# Patient Record
Sex: Female | Born: 1990 | Race: Black or African American | Hispanic: No | Marital: Single | Smoking: Former smoker
Health system: Southern US, Community
[De-identification: ages and names within clinical notes are randomized; demographics above are authoritative.]

## PROBLEM LIST (undated history)

## (undated) ENCOUNTER — Inpatient Hospital Stay (HOSPITAL_COMMUNITY): Payer: Self-pay

## (undated) DIAGNOSIS — N883 Incompetence of cervix uteri: Secondary | ICD-10-CM

## (undated) DIAGNOSIS — O24419 Gestational diabetes mellitus in pregnancy, unspecified control: Secondary | ICD-10-CM

## (undated) DIAGNOSIS — Z789 Other specified health status: Secondary | ICD-10-CM

## (undated) DIAGNOSIS — E119 Type 2 diabetes mellitus without complications: Secondary | ICD-10-CM

## (undated) HISTORY — PX: INDUCED ABORTION: SHX677

---
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
## 2010-01-03 ENCOUNTER — Encounter (INDEPENDENT_AMBULATORY_CARE_PROVIDER_SITE_OTHER): Payer: Self-pay | Admitting: *Deleted

## 2010-01-03 ENCOUNTER — Ambulatory Visit: Payer: Self-pay | Admitting: Obstetrics & Gynecology

## 2010-01-10 ENCOUNTER — Ambulatory Visit: Payer: Self-pay | Admitting: Obstetrics & Gynecology

## 2010-01-10 ENCOUNTER — Encounter: Payer: Self-pay | Admitting: Obstetrics & Gynecology

## 2010-01-10 LAB — CONVERTED CEMR LAB
Platelets: 158 10*3/uL (ref 150–400)
WBC: 10.9 10*3/uL — ABNORMAL HIGH (ref 4.0–10.5)

## 2010-01-31 ENCOUNTER — Ambulatory Visit: Payer: Self-pay | Admitting: Obstetrics and Gynecology

## 2010-02-03 ENCOUNTER — Inpatient Hospital Stay (HOSPITAL_COMMUNITY)
Admission: AD | Admit: 2010-02-03 | Discharge: 2010-02-03 | Payer: Self-pay | Source: Home / Self Care | Admitting: Obstetrics & Gynecology

## 2010-02-14 ENCOUNTER — Ambulatory Visit (HOSPITAL_COMMUNITY)
Admission: RE | Admit: 2010-02-14 | Discharge: 2010-02-14 | Payer: Self-pay | Source: Home / Self Care | Admitting: Obstetrics & Gynecology

## 2010-02-14 ENCOUNTER — Ambulatory Visit: Payer: Self-pay | Admitting: Family Medicine

## 2010-02-21 ENCOUNTER — Ambulatory Visit: Payer: Self-pay | Admitting: Family Medicine

## 2010-02-28 ENCOUNTER — Ambulatory Visit: Payer: Self-pay | Admitting: Obstetrics & Gynecology

## 2010-03-07 ENCOUNTER — Encounter: Payer: Self-pay | Admitting: Family Medicine

## 2010-03-07 ENCOUNTER — Ambulatory Visit: Payer: Self-pay | Admitting: Family Medicine

## 2010-03-14 ENCOUNTER — Ambulatory Visit: Payer: Self-pay | Admitting: Obstetrics & Gynecology

## 2010-03-17 ENCOUNTER — Inpatient Hospital Stay (HOSPITAL_COMMUNITY)
Admission: AD | Admit: 2010-03-17 | Discharge: 2010-03-17 | Payer: Self-pay | Source: Home / Self Care | Attending: Obstetrics & Gynecology | Admitting: Obstetrics & Gynecology

## 2010-03-17 ENCOUNTER — Inpatient Hospital Stay (HOSPITAL_COMMUNITY)
Admission: AD | Admit: 2010-03-17 | Discharge: 2010-03-19 | Payer: Self-pay | Source: Home / Self Care | Attending: Obstetrics & Gynecology | Admitting: Obstetrics & Gynecology

## 2010-03-20 ENCOUNTER — Ambulatory Visit: Admit: 2010-03-20 | Payer: Self-pay | Admitting: Obstetrics & Gynecology

## 2010-04-07 ENCOUNTER — Encounter: Payer: Self-pay | Admitting: *Deleted

## 2010-05-27 LAB — POCT URINALYSIS DIPSTICK
Bilirubin Urine: NEGATIVE
Glucose, UA: NEGATIVE mg/dL
Glucose, UA: NEGATIVE mg/dL
Ketones, ur: NEGATIVE mg/dL
Ketones, ur: NEGATIVE mg/dL
Nitrite: NEGATIVE
Specific Gravity, Urine: 1.02 (ref 1.005–1.030)
Urobilinogen, UA: 0.2 mg/dL (ref 0.0–1.0)

## 2010-05-27 LAB — CBC
HCT: 34 % — ABNORMAL LOW (ref 36.0–46.0)
MCV: 78 fL (ref 78.0–100.0)
Platelets: 149 10*3/uL — ABNORMAL LOW (ref 150–400)
RBC: 4.36 MIL/uL (ref 3.87–5.11)
WBC: 14.5 10*3/uL — ABNORMAL HIGH (ref 4.0–10.5)

## 2010-05-28 LAB — POCT URINALYSIS DIPSTICK
Bilirubin Urine: NEGATIVE
Bilirubin Urine: NEGATIVE
Glucose, UA: NEGATIVE mg/dL
Ketones, ur: NEGATIVE mg/dL
Ketones, ur: NEGATIVE mg/dL
Nitrite: NEGATIVE
Protein, ur: NEGATIVE mg/dL
Specific Gravity, Urine: >=1.03 (ref 1.005–1.030)
pH: 6 (ref 5.0–8.0)

## 2010-05-28 LAB — URINALYSIS, ROUTINE W REFLEX MICROSCOPIC
Glucose, UA: NEGATIVE mg/dL
Ketones, ur: NEGATIVE mg/dL
Nitrite: NEGATIVE
Specific Gravity, Urine: 1.015 (ref 1.005–1.030)
pH: 6.5 (ref 5.0–8.0)

## 2010-05-29 LAB — POCT URINALYSIS DIPSTICK
Bilirubin Urine: NEGATIVE
Bilirubin Urine: NEGATIVE
Glucose, UA: NEGATIVE mg/dL
Glucose, UA: NEGATIVE mg/dL
Ketones, ur: NEGATIVE mg/dL
Ketones, ur: NEGATIVE mg/dL
Protein, ur: NEGATIVE mg/dL
Specific Gravity, Urine: 1.02 (ref 1.005–1.030)
Specific Gravity, Urine: 1.02 (ref 1.005–1.030)
Urobilinogen, UA: 0.2 mg/dL (ref 0.0–1.0)

## 2010-05-30 LAB — URINALYSIS, ROUTINE W REFLEX MICROSCOPIC
Glucose, UA: NEGATIVE mg/dL
Glucose, UA: NEGATIVE mg/dL
Hgb urine dipstick: NEGATIVE
Protein, ur: NEGATIVE mg/dL
Specific Gravity, Urine: 1.01 (ref 1.005–1.030)
Specific Gravity, Urine: 1.02 (ref 1.005–1.030)
pH: 7 (ref 5.0–8.0)

## 2010-05-30 LAB — URINE MICROSCOPIC-ADD ON

## 2010-05-30 LAB — POCT URINALYSIS DIPSTICK
Bilirubin Urine: NEGATIVE
Glucose, UA: NEGATIVE mg/dL
Hgb urine dipstick: NEGATIVE
Ketones, ur: NEGATIVE mg/dL
Ketones, ur: NEGATIVE mg/dL
Specific Gravity, Urine: 1.02 (ref 1.005–1.030)
Specific Gravity, Urine: 1.02 (ref 1.005–1.030)

## 2010-05-30 LAB — STREP B DNA PROBE

## 2010-05-30 LAB — WET PREP, GENITAL

## 2010-05-30 LAB — GC/CHLAMYDIA PROBE AMP, GENITAL
Chlamydia, DNA Probe: NEGATIVE
GC Probe Amp, Genital: NEGATIVE

## 2010-05-30 LAB — GC/CHLAMYDIA PROBE AMP, URINE
Chlamydia, Swab/Urine, PCR: NEGATIVE
GC Probe Amp, Urine: NEGATIVE

## 2010-05-31 LAB — GC/CHLAMYDIA PROBE AMP, GENITAL: Chlamydia, DNA Probe: NEGATIVE

## 2010-05-31 LAB — URINALYSIS, ROUTINE W REFLEX MICROSCOPIC
Bilirubin Urine: NEGATIVE
Ketones, ur: NEGATIVE mg/dL
Ketones, ur: 15 mg/dL — AB
Nitrite: NEGATIVE
Nitrite: NEGATIVE
Specific Gravity, Urine: 1.025 (ref 1.005–1.030)
Specific Gravity, Urine: 1.025 (ref 1.005–1.030)
Urobilinogen, UA: 0.2 mg/dL (ref 0.0–1.0)
pH: 6.5 (ref 5.0–8.0)

## 2010-05-31 LAB — COMPREHENSIVE METABOLIC PANEL
BUN: 8 mg/dL (ref 6–23)
CO2: 24 mEq/L (ref 19–32)
Chloride: 102 mEq/L (ref 96–112)
Creatinine, Ser: 0.54 mg/dL (ref 0.4–1.2)
GFR calc non Af Amer: >60 mL/min (ref >60)
Total Bilirubin: 0.6 mg/dL (ref 0.3–1.2)

## 2010-05-31 LAB — CBC
Hemoglobin: 11.7 g/dL — ABNORMAL LOW (ref 12.0–15.0)
MCH: 28 pg (ref 26.0–34.0)
MCV: 83.5 fL (ref 78.0–100.0)
RBC: 4.2 MIL/uL (ref 3.87–5.11)

## 2010-05-31 LAB — URINE MICROSCOPIC-ADD ON

## 2010-05-31 LAB — WET PREP, GENITAL
Clue Cells Wet Prep HPF POC: NONE SEEN
Yeast Wet Prep HPF POC: NONE SEEN

## 2010-05-31 LAB — DIFFERENTIAL
Basophils Absolute: 0 10*3/uL (ref 0.0–0.1)
Eosinophils Relative: 0 % (ref 0–5)
Lymphocytes Relative: 18 % (ref 12–46)
Neutro Abs: 5.9 10*3/uL (ref 1.7–7.7)

## 2010-06-01 LAB — WET PREP, GENITAL: Trich, Wet Prep: NONE SEEN

## 2010-06-01 LAB — URINALYSIS, ROUTINE W REFLEX MICROSCOPIC
Ketones, ur: NEGATIVE mg/dL
Nitrite: NEGATIVE
Specific Gravity, Urine: 1.015 (ref 1.005–1.030)
Urobilinogen, UA: 0.2 mg/dL (ref 0.0–1.0)
pH: 7.5 (ref 5.0–8.0)

## 2010-06-01 LAB — GC/CHLAMYDIA PROBE AMP, GENITAL
Chlamydia, DNA Probe: POSITIVE — AB
GC Probe Amp, Genital: NEGATIVE

## 2010-06-01 LAB — URINE MICROSCOPIC-ADD ON

## 2010-10-09 ENCOUNTER — Emergency Department (HOSPITAL_COMMUNITY)
Admission: EM | Admit: 2010-10-09 | Discharge: 2010-10-09 | Disposition: A | Discharge status: Home / Self Care | Payer: Self-pay | Attending: Emergency Medicine | Admitting: Emergency Medicine

## 2010-10-09 DIAGNOSIS — H5789 Other specified disorders of eye and adnexa: Secondary | ICD-10-CM | POA: Insufficient documentation

## 2010-10-09 DIAGNOSIS — H109 Unspecified conjunctivitis: Secondary | ICD-10-CM | POA: Insufficient documentation

## 2010-12-06 ENCOUNTER — Emergency Department (HOSPITAL_COMMUNITY)
Admission: EM | Admit: 2010-12-06 | Discharge: 2010-12-07 | Disposition: A | Discharge status: Home / Self Care | Payer: Self-pay | Attending: Emergency Medicine | Admitting: Emergency Medicine

## 2010-12-06 DIAGNOSIS — N390 Urinary tract infection, site not specified: Secondary | ICD-10-CM | POA: Insufficient documentation

## 2010-12-06 DIAGNOSIS — R300 Dysuria: Secondary | ICD-10-CM | POA: Insufficient documentation

## 2010-12-06 LAB — URINALYSIS, ROUTINE W REFLEX MICROSCOPIC
Bilirubin Urine: NEGATIVE
Ketones, ur: NEGATIVE mg/dL
Nitrite: NEGATIVE
Protein, ur: 30 mg/dL — AB
Specific Gravity, Urine: 1.026 (ref 1.005–1.030)
Urobilinogen, UA: 0.2 mg/dL (ref 0.0–1.0)

## 2010-12-06 LAB — URINE MICROSCOPIC-ADD ON

## 2010-12-07 LAB — PREGNANCY, URINE: Preg Test, Ur: NEGATIVE

## 2010-12-09 LAB — URINE CULTURE
Colony Count: >=100000
Culture  Setup Time: 201209220541

## 2012-09-26 ENCOUNTER — Encounter (HOSPITAL_COMMUNITY): Payer: Self-pay | Admitting: Family Medicine

## 2012-09-26 ENCOUNTER — Emergency Department (HOSPITAL_COMMUNITY)
Admission: EM | Admit: 2012-09-26 | Discharge: 2012-09-27 | Disposition: A | Discharge status: Home / Self Care | Payer: Self-pay | Attending: Emergency Medicine | Admitting: Emergency Medicine

## 2012-09-26 DIAGNOSIS — N898 Other specified noninflammatory disorders of vagina: Secondary | ICD-10-CM | POA: Insufficient documentation

## 2012-09-26 DIAGNOSIS — F17200 Nicotine dependence, unspecified, uncomplicated: Secondary | ICD-10-CM | POA: Insufficient documentation

## 2012-09-26 LAB — URINALYSIS, ROUTINE W REFLEX MICROSCOPIC
Bilirubin Urine: NEGATIVE
Glucose, UA: NEGATIVE mg/dL
Ketones, ur: NEGATIVE mg/dL
Specific Gravity, Urine: 1.036 — ABNORMAL HIGH (ref 1.005–1.030)
pH: 6 (ref 5.0–8.0)

## 2012-09-26 LAB — WET PREP, GENITAL
Clue Cells Wet Prep HPF POC: NONE SEEN
Trich, Wet Prep: NONE SEEN

## 2012-09-26 NOTE — ED Notes (Signed)
Patient states she has had a white vaginal discharge for 3 days. Denies odor. Reports that she had vaginal itching today. Denies dysuria.

## 2012-09-26 NOTE — ED Provider Notes (Signed)
   History    CSN: [id number] Arrival date & time 09/26/12  2019  First MD Initiated Contact with Patient 09/26/12 2257     Chief Complaint  Patient presents with  . Vaginal Discharge   (Consider location/radiation/quality/duration/timing/severity/associated sxs/prior Treatment) HPI Comments: Vaginal discharge for the pat 3 days   Just returned from a cruise, 1 sexual partner for the past 4 years, no concern for STD  Patient is a 22 y.o. female presenting with vaginal discharge. The history is provided by the patient.  Vaginal Discharge Quality:  Thick and milky Severity:  Moderate Onset quality:  Gradual Duration:  3 days Timing:  Constant Progression:  Worsening Chronicity:  New Context: spontaneously   Context: not recent antibiotic use   Relieved by:  Nothing Ineffective treatments:  None tried Associated symptoms: vaginal itching   Associated symptoms: no dysuria, no fever, no genital lesions, no nausea and no urinary frequency   Risk factors: no gynecological surgery, no STI and no STI exposure    History reviewed. No pertinent past medical history. History reviewed. No pertinent past surgical history. No family history on file. History  Substance Use Topics  . Smoking status: Current Every Day Smoker    Types: Cigars  . Smokeless tobacco: Not on file  . Alcohol Use: Yes     Comment: Occasionally   OB History   Grav Para Term Preterm Abortions TAB SAB Ect Mult Living                 Review of Systems  Constitutional: Negative for fever.  Gastrointestinal: Negative for nausea.  Genitourinary: Positive for vaginal discharge. Negative for dysuria and vaginal pain.  Skin: Negative for rash.  Neurological: Negative for dizziness.  All other systems reviewed and are negative.    Allergies  Review of patient's allergies indicates no known allergies.  Home Medications  No current outpatient prescriptions on file. BP 107/57  Pulse 69  Temp(Src) 99.1 F  (37.3 C) (Oral)  Resp 15  Ht 5\' 7"  (1.702 m)  Wt 140 lb (63.504 kg)  BMI 21.92 kg/m2  SpO2 100%  LMP 09/06/2012 Physical Exam  Nursing note and vitals reviewed. Constitutional: She appears well-nourished.  HENT:  Head: Normocephalic.  Eyes: Pupils are equal, round, and reactive to light.  Neck: Normal range of motion.  Cardiovascular: Normal rate.   Pulmonary/Chest: Effort normal and breath sounds normal.  Abdominal: Soft. Bowel sounds are normal. She exhibits no distension.  Genitourinary: Uterus normal. Vaginal discharge found.  Musculoskeletal: Normal range of motion.  Neurological: She is alert.  Skin: Skin is warm and dry. No erythema.    ED Course  Procedures (including critical care time) Labs Reviewed  URINALYSIS, ROUTINE W REFLEX MICROSCOPIC - Abnormal; Notable for the following:    Specific Gravity, Urine 1.036 (*)    All other components within normal limits  GC/CHLAMYDIA PROBE AMP  WET PREP, GENITAL  POCT PREGNANCY, URINE   No results found. No diagnosis found.  MDM   Will treat with metrogel and desown   Arman Filter, NP 09/27/12 4098

## 2012-09-27 LAB — GC/CHLAMYDIA PROBE AMP
CT Probe RNA: NEGATIVE
GC Probe RNA: NEGATIVE

## 2012-09-27 MED ORDER — DESONIDE 0.05 % EX OINT: TOPICAL_OINTMENT | Freq: Two times a day (BID) | CUTANEOUS | Status: DC

## 2012-09-27 MED ORDER — METRONIDAZOLE 0.75 % VA GEL: 1.0000 | Freq: Two times a day (BID) | VAGINAL | Status: DC

## 2012-09-27 NOTE — ED Provider Notes (Signed)
Medical screening examination/treatment/procedure(s) were performed by non-physician practitioner and as supervising physician I was immediately available for consultation/collaboration.  John-Adam Lupe Bonner, M.D.  John-Adam Dynasia Kercheval, MD 09/27/12 0402 

## 2014-06-14 ENCOUNTER — Inpatient Hospital Stay (HOSPITAL_COMMUNITY)
Admission: AD | Admit: 2014-06-14 | Discharge: 2014-06-14 | Disposition: A | Discharge status: Home / Self Care | Payer: Self-pay | Source: Ambulatory Visit | Attending: Obstetrics & Gynecology | Admitting: Obstetrics & Gynecology

## 2014-06-14 ENCOUNTER — Encounter (HOSPITAL_COMMUNITY): Payer: Self-pay | Admitting: *Deleted

## 2014-06-14 DIAGNOSIS — Z87891 Personal history of nicotine dependence: Secondary | ICD-10-CM | POA: Insufficient documentation

## 2014-06-14 DIAGNOSIS — N939 Abnormal uterine and vaginal bleeding, unspecified: Secondary | ICD-10-CM

## 2014-06-14 HISTORY — DX: Other specified health status: Z78.9

## 2014-06-14 LAB — CBC WITH DIFFERENTIAL/PLATELET
BASOS ABS: 0 10*3/uL (ref 0.0–0.1)
Basophils Relative: 0 % (ref 0–1)
EOS ABS: 0.2 10*3/uL (ref 0.0–0.7)
EOS PCT: 2 % (ref 0–5)
HEMATOCRIT: 35 % — AB (ref 36.0–46.0)
HEMOGLOBIN: 11.6 g/dL — AB (ref 12.0–15.0)
LYMPHS ABS: 2.2 10*3/uL (ref 0.7–4.0)
Lymphocytes Relative: 24 % (ref 12–46)
MCH: 26.9 pg (ref 26.0–34.0)
MCHC: 33.1 g/dL (ref 30.0–36.0)
MCV: 81 fL (ref 78.0–100.0)
Monocytes Absolute: 1 10*3/uL (ref 0.1–1.0)
Monocytes Relative: 11 % (ref 3–12)
NEUTROS ABS: 6.1 10*3/uL (ref 1.7–7.7)
Neutrophils Relative %: 64 % (ref 43–77)
Platelets: 222 10*3/uL (ref 150–400)
RBC: 4.32 MIL/uL (ref 3.87–5.11)
RDW: 13.9 % (ref 11.5–15.5)
WBC: 9.5 10*3/uL (ref 4.0–10.5)

## 2014-06-14 LAB — WET PREP, GENITAL
Clue Cells Wet Prep HPF POC: NONE SEEN
TRICH WET PREP: NONE SEEN
Yeast Wet Prep HPF POC: NONE SEEN

## 2014-06-14 LAB — POCT PREGNANCY, URINE: PREG TEST UR: NEGATIVE

## 2014-06-14 NOTE — MAU Note (Signed)
Done came on the 3rd time this month. Has been having heavy bleeding, started last night..  Has done 2 preg tests, both were neg.  Called to the dr, was told to come up here.

## 2014-06-14 NOTE — MAU Provider Note (Signed)
History     CSN: [id number]  Arrival date and time: 06/14/14 1106   First Provider Initiated Contact with Patient 06/14/14 1307      Chief Complaint  Patient presents with  . Vaginal Bleeding   HPI Kelsey Jones y.o. [id number] nonpregnant female presents complaining of having a period 3 times during the month of March.  She is not on contraception and does not desire pregnancy.  She notes a yeast infection 2 months ago.  She has noted some white vaginal discharge, back pain 2 weeks ago, denies abdominal pain, dysuria, fever, weakness, nausea, vomiting.  Last week she had nausea and vomiting one time.  She used Plan B about 2 weeks ago.  The following day, she noticed her period starting.   OB History    Gravida Para Term Preterm AB TAB SAB Ectopic Multiple Living   Past Medical History  Diagnosis Date  . Medical history non-contributory     Past Surgical History  Procedure Laterality Date  . Induced abortion      Family History  Problem Relation Age of Onset  . Diabetes Mother   . Cancer Maternal Grandmother   . Diabetes Maternal Grandmother     History  Substance Use Topics  . Smoking status: Former Smoker    Types: Cigars  . Smokeless tobacco: Not on file  . Alcohol Use: Yes     Comment: Occasionally    Allergies: No Known Allergies  Prescriptions prior to admission  Medication Sig Dispense Refill Last Dose  . ibuprofen (ADVIL,MOTRIN) 200 MG tablet Take 800 mg by mouth every 6 (six) hours as needed for moderate pain.   Past Month at Unknown time  . desonide (DESOWEN) 0.05 % ointment Apply topically 2 (two) times daily. (Patient not taking: Reported on 06/14/2014) 15 g 0   . metroNIDAZOLE (METROGEL VAGINAL) 0.75 % vaginal gel Place 1 Applicatorful vaginally 2 (two) times daily. (Patient not taking: Reported on 06/14/2014) 70 g 0     ROS Pertinent ROS in HPI   Physical Exam   Blood pressure 123/77, pulse 96, temperature 98.9 F  (37.2 C), temperature source Oral, resp. rate 18, height  (1.676 m), weight 135 lb (61.236 kg), last menstrual period 06/13/2014.  Physical Exam  Constitutional: She is oriented to person, place, and time. She appears well-developed and well-nourished. No distress.  HENT:  Head: Normocephalic and atraumatic.  Eyes: EOM are normal.  Neck: Normal range of motion.  Cardiovascular: Normal rate, regular rhythm and normal heart sounds.   Respiratory: Effort normal and breath sounds normal. No respiratory distress.  GI: Soft. Bowel sounds are normal. She exhibits no distension. There is no tenderness. There is no rebound and no guarding.  Genitourinary:  Small amt of bleeding coming from cervical os.  No discharge appreciated No CMT.  No adnexal mass or tenderness appreciated.  Musculoskeletal: Normal range of motion.  Neurological: She is alert and oriented to person, place, and time.  Skin: Skin is warm and dry.  Psychiatric: She has a normal mood and affect.   Results for orders placed or performed during the hospital encounter of 06/14/14 (from the past 24 hour(s))  CBC with Differential/Platelet     Status: Abnormal   Collection Time: 06/14/14 11:55 AM  Result Value Ref Range   WBC 9.5 4.0 - 10.5 K/uL   RBC 4.32 3.87 - 5.11 MIL/uL  Hemoglobin 11.6 (L) 12.0 - 15.0 g/dL   HCT 16.135.0 (L) 09.636.0 - 04.546.0 %   MCV 81.0 78.0 - 100.0 fL   MCH 26.9 26.0 - 34.0 pg   MCHC 33.1 30.0 - 36.0 g/dL   RDW 40.913.9 81.111.5 - 91.415.5 %   Platelets 222 150 - 400 K/uL   Neutrophils Relative % 64 43 - 77 %   Neutro Abs 6.1 1.7 - 7.7 K/uL   Lymphocytes Relative 24 12 - 46 %   Lymphs Abs 2.2 0.7 - 4.0 K/uL   Monocytes Relative 11 3 - 12 %   Monocytes Absolute 1.0 0.1 - 1.0 K/uL   Eosinophils Relative 2 0 - 5 %   Eosinophils Absolute 0.2 0.0 - 0.7 K/uL   Basophils Relative 0 0 - 1 %   Basophils Absolute 0.0 0.0 - 0.1 K/uL  Pregnancy, urine POC     Status: None   Collection Time: 06/14/14 12:01 PM  Result  Value Ref Range   Preg Test, Ur NEGATIVE NEGATIVE  Wet prep, genital     Status: Abnormal   Collection Time: 06/14/14  1:17 PM  Result Value Ref Range   Yeast Wet Prep HPF POC NONE SEEN NONE SEEN   Trich, Wet Prep NONE SEEN NONE SEEN   Clue Cells Wet Prep HPF POC NONE SEEN NONE SEEN   WBC, Wet Prep HPF POC FEW (A) NONE SEEN    MAU Course  Procedures  MDM Negative PRT.  CBC stable.   STD screening done Assessment and Plan  A: Abnormal vaginal bleeding following Plan B usage  P: Discharge to home Abnormal bleeding likely caused from Plan B usage.   Follow up at health department to begin contraception Condoms for all IC for STD prevention and contraception at this time.   Labs pending.   Patient may return to MAU as needed or if her condition were to change or worsen   Bertram Denvereague Clark, Karen E 06/14/2014, 1:09 PM

## 2014-06-14 NOTE — MAU Note (Signed)
C/o having  3 periods this month;

## 2014-06-14 NOTE — Discharge Instructions (Signed)
Abnormal Uterine Bleeding Abnormal uterine bleeding can affect women at various stages in life, including teenagers, women in their reproductive years, pregnant women, and women who have reached menopause. Several kinds of uterine bleeding are considered abnormal, including:  Bleeding or spotting between periods.   Bleeding after sexual intercourse.   Bleeding that is heavier or more than normal.   Periods that last longer than usual.  Bleeding after menopause.  Many cases of abnormal uterine bleeding are minor and simple to treat, while others are more serious. Any type of abnormal bleeding should be evaluated by your health care provider. Treatment will depend on the cause of the bleeding. HOME CARE INSTRUCTIONS Monitor your condition for any changes. The following actions may help to alleviate any discomfort you are experiencing:  Avoid the use of tampons and douches as directed by your health care provider.  Change your pads frequently. You should get regular pelvic exams and Pap tests. Keep all follow-up appointments for diagnostic tests as directed by your health care provider.  SEEK MEDICAL CARE IF:   Your bleeding lasts more than 1 week.   You feel dizzy at times.  SEEK IMMEDIATE MEDICAL CARE IF:   You pass out.   You are changing pads every 15 to 30 minutes.   You have abdominal pain.  You have a fever.   You become sweaty or weak.   You are passing large blood clots from the vagina.   You start to feel nauseous and vomit. MAKE SURE YOU:   Understand these instructions.  Will watch your condition.  Will get help right away if you are not doing well or get worse. Document Released: 03/03/2005 Document Revised: 03/08/2013 Document Reviewed: 09/30/2012 Exit[employer] Patient Information 2015 [employer]. This information is not intended to replace advice given to you by your health care provider. Make sure you discuss any questions you have with your  health care provider.  

## 2014-06-15 LAB — GC/CHLAMYDIA PROBE AMP (~~LOC~~) NOT AT ARMC
Chlamydia: NEGATIVE
NEISSERIA GONORRHEA: NEGATIVE

## 2015-04-11 ENCOUNTER — Emergency Department (HOSPITAL_COMMUNITY)
Admission: EM | Admit: 2015-04-11 | Discharge: 2015-04-11 | Disposition: A | Discharge status: Home / Self Care | Payer: 59 | Attending: Emergency Medicine | Admitting: Emergency Medicine

## 2015-04-11 ENCOUNTER — Encounter (HOSPITAL_COMMUNITY): Payer: Self-pay | Admitting: Emergency Medicine

## 2015-04-11 DIAGNOSIS — Y998 Other external cause status: Secondary | ICD-10-CM | POA: Diagnosis not present

## 2015-04-11 DIAGNOSIS — Y9389 Activity, other specified: Secondary | ICD-10-CM | POA: Insufficient documentation

## 2015-04-11 DIAGNOSIS — S60861A Insect bite (nonvenomous) of right wrist, initial encounter: Secondary | ICD-10-CM | POA: Diagnosis not present

## 2015-04-11 DIAGNOSIS — Z87891 Personal history of nicotine dependence: Secondary | ICD-10-CM | POA: Diagnosis not present

## 2015-04-11 DIAGNOSIS — R2231 Localized swelling, mass and lump, right upper limb: Secondary | ICD-10-CM | POA: Diagnosis present

## 2015-04-11 DIAGNOSIS — Y9289 Other specified places as the place of occurrence of the external cause: Secondary | ICD-10-CM | POA: Insufficient documentation

## 2015-04-11 DIAGNOSIS — W57XXXA Bitten or stung by nonvenomous insect and other nonvenomous arthropods, initial encounter: Secondary | ICD-10-CM | POA: Insufficient documentation

## 2015-04-11 MED ORDER — CEPHALEXIN 500 MG PO CAPS: 500.0000 mg | ORAL_CAPSULE | Freq: Four times a day (QID) | ORAL | Status: DC

## 2015-04-11 NOTE — ED Notes (Signed)
Pt st's she first noticed a bug bite on right inner wrist 4 days ago.  Pt denies any itching but st's area is painful

## 2015-04-11 NOTE — ED Provider Notes (Signed)
CSN: [id number]     Arrival date & time 04/11/15  1457 History  By signing my name below, I, [facility], attest that this documentation has been prepared under the direction and in the presence of Arthor Captain, PA-C. Electronically Signed: Randell Patient, ED Scribe. 04/11/2015. 4:36 PM.   No chief complaint on file.  The history is provided by the patient. No language interpreter was used.   HPI Comments: Kelsey Jones is a 25 y.o. female with no hx of chronic conditions who presents to the Emergency Department complaining of gradually increasing swelling and redness, and gradually worsening, constant, pain today to the dorsal surface of the right wrist that first appeared 5 days ago while at work. Patient reports that she works as a Geophysicist/field seismologist at [employer] and noticed a small bump on her wrist that gradually increased in size and became erythematous 4 days ago, followed by mild pain today. She has applied alcohol to the area without relief. She denies seeing an insect or feeling a bite on her arm. Patient denies an hx MRSA and DM. She denies illicit IV drug use. Denies fever and chills.  Past Medical History  Diagnosis Date  . Medical history non-contributory    Past Surgical History  Procedure Laterality Date  . Induced abortion     Family History  Problem Relation Age of Onset  . Diabetes Mother   . Cancer Maternal Grandmother   . Diabetes Maternal Grandmother    Social History  Substance Use Topics  . Smoking status: Former Smoker    Types: Cigars  . Smokeless tobacco: Not on file  . Alcohol Use: Yes     Comment: Occasionally   OB History    Gravida Para Term Preterm AB TAB SAB Ectopic Multiple Living   Review of Systems  Constitutional: Negative for fever.  Skin: Positive for wound.      Allergies  Review of patient's allergies indicates no known allergies.  Home Medications   Prior to Admission medications   Medication Sig  Start Date End Date Taking? Authorizing Provider  cephALEXin (KEFLEX) 500 MG capsule Take 1 capsule (500 mg total) by mouth 4 (four) times daily. 04/11/15   Arthor Captain, PA-C  ibuprofen (ADVIL,MOTRIN) 200 MG tablet Take 800 mg by mouth every 6 (six) hours as needed for moderate pain.    Historical Provider, MD   BP 111/70 mmHg  Pulse 74  Temp(Src) 98.2 F (36.8 C) (Oral)  Resp 16  Ht  (1.702 m)  Wt 150 lb 12.8 oz (68.402 kg)  BMI 23.61 kg/m2  SpO2 99% Physical Exam  Constitutional: She is oriented to person, place, and time. She appears well-developed and well-nourished. No distress.  HENT:  Head: Normocephalic and atraumatic.  Eyes: Conjunctivae and EOM are normal.  Neck: Neck supple. No tracheal deviation present.  Cardiovascular: Normal rate.   Pulmonary/Chest: Effort normal. No respiratory distress.  Musculoskeletal: Normal range of motion.  Neurological: She is alert and oriented to person, place, and time.  Skin: Skin is warm and dry.  2x3 cm circular lesion on the right dorsal surface of the wrist with induration and erythema. No fluctuance or streaking. Neruovascular intact in hand.  Psychiatric: She has a normal mood and affect. Her behavior is normal.  Nursing note and vitals reviewed.  ED Course  Procedures  DIAGNOSTIC STUDIES: Oxygen Saturation is 99% on RA, normal by my interpretation.  COORDINATION OF CARE: 4:17 PM Will prescribed antibiotics. Advised pt to return if symptoms worsen. Discussed treatment plan with pt at bedside and pt agreed to plan.  Labs Review Labs Reviewed - No data to display  Imaging Review No results found. I have personally reviewed and evaluated these images and lab results as part of my medical decision-making.   EKG Interpretation None      MDM   Final diagnoses:  Insect bite    Patient with an insect bite. Possible infection. Will treat with antibiotics. Return precautions discussed. Pt is safe for discharge at  this time  I personally performed the services described in this documentation, which was scribed in my presence. The recorded information has been reviewed and is accurate.      Arthor Captain, PA-C 04/11/15 1637  Raeford Razor, MD 04/15/15 [phone]

## 2015-04-11 NOTE — Discharge Instructions (Signed)
APply ice to the affected area. Use tylenol or ibuprofen for pain  Cellulitis Cellulitis is an infection of the skin and the tissue beneath it. The infected area is usually red and tender. Cellulitis occurs most often in the arms and lower legs.  CAUSES  Cellulitis is caused by bacteria that enter the skin through cracks or cuts in the skin. The most common types of bacteria that cause cellulitis are staphylococci and streptococci. SIGNS AND SYMPTOMS   Redness and warmth.  Swelling.  Tenderness or pain.  Fever. DIAGNOSIS  Your health care provider can usually determine what is wrong based on a physical exam. Blood tests may also be done. TREATMENT  Treatment usually involves taking an antibiotic medicine. HOME CARE INSTRUCTIONS   Take your antibiotic medicine as directed by your health care provider. Finish the antibiotic even if you start to feel better.  Keep the infected arm or leg elevated to reduce swelling.  Apply a warm cloth to the affected area up to 4 times per day to relieve pain.  Take medicines only as directed by your health care provider.  Keep all follow-up visits as directed by your health care provider. SEEK MEDICAL CARE IF:   You notice red streaks coming from the infected area.  Your red area gets larger or turns dark in color.  Your bone or joint underneath the infected area becomes painful after the skin has healed.  Your infection returns in the same area or another area.  You notice a swollen bump in the infected area.  You develop new symptoms.  You have a fever. SEEK IMMEDIATE MEDICAL CARE IF:   You feel very sleepy.  You develop vomiting or diarrhea.  You have a general ill feeling (malaise) with muscle aches and pains.   This information is not intended to replace advice given to you by your health care provider. Make sure you discuss any questions you have with your health care provider.   Document Released: 12/11/2004 Document  Revised: 11/22/2014 Document Reviewed: 05/19/2011 Elsevier Interactive Patient Education [employer]

## 2015-07-04 ENCOUNTER — Inpatient Hospital Stay (HOSPITAL_COMMUNITY)
Admission: AD | Admit: 2015-07-04 | Discharge: 2015-07-04 | Disposition: A | Discharge status: Home / Self Care | Payer: 59 | Source: Ambulatory Visit | Attending: Obstetrics & Gynecology | Admitting: Obstetrics & Gynecology

## 2015-07-04 ENCOUNTER — Encounter (HOSPITAL_COMMUNITY): Payer: Self-pay | Admitting: *Deleted

## 2015-07-04 DIAGNOSIS — A499 Bacterial infection, unspecified: Secondary | ICD-10-CM

## 2015-07-04 DIAGNOSIS — Z87891 Personal history of nicotine dependence: Secondary | ICD-10-CM | POA: Insufficient documentation

## 2015-07-04 DIAGNOSIS — N898 Other specified noninflammatory disorders of vagina: Secondary | ICD-10-CM | POA: Diagnosis present

## 2015-07-04 DIAGNOSIS — Z113 Encounter for screening for infections with a predominantly sexual mode of transmission: Secondary | ICD-10-CM | POA: Diagnosis not present

## 2015-07-04 DIAGNOSIS — B9689 Other specified bacterial agents as the cause of diseases classified elsewhere: Secondary | ICD-10-CM

## 2015-07-04 DIAGNOSIS — N760 Acute vaginitis: Secondary | ICD-10-CM | POA: Diagnosis not present

## 2015-07-04 LAB — WET PREP, GENITAL
SPERM: NONE SEEN
Trich, Wet Prep: NONE SEEN
YEAST WET PREP: NONE SEEN

## 2015-07-04 LAB — CBC
HCT: 36.2 % (ref 36.0–46.0)
HEMOGLOBIN: 12.1 g/dL (ref 12.0–15.0)
MCH: 26.5 pg (ref 26.0–34.0)
MCHC: 33.4 g/dL (ref 30.0–36.0)
MCV: 79.2 fL (ref 78.0–100.0)
Platelets: 210 10*3/uL (ref 150–400)
RBC: 4.57 MIL/uL (ref 3.87–5.11)
RDW: 14.1 % (ref 11.5–15.5)
WBC: 5.3 10*3/uL (ref 4.0–10.5)

## 2015-07-04 LAB — RAPID HIV SCREEN (HIV 1/2 AB+AG)
HIV 1/2 ANTIBODIES: NONREACTIVE
HIV-1 P24 ANTIGEN - HIV24: NONREACTIVE

## 2015-07-04 LAB — POCT PREGNANCY, URINE: Preg Test, Ur: NEGATIVE

## 2015-07-04 LAB — URINALYSIS, ROUTINE W REFLEX MICROSCOPIC
Bilirubin Urine: NEGATIVE
GLUCOSE, UA: NEGATIVE mg/dL
HGB URINE DIPSTICK: NEGATIVE
Ketones, ur: NEGATIVE mg/dL
Leukocytes, UA: NEGATIVE
Nitrite: NEGATIVE
PH: 7 (ref 5.0–8.0)
PROTEIN: NEGATIVE mg/dL
Specific Gravity, Urine: 1.02 (ref 1.005–1.030)

## 2015-07-04 MED ORDER — METRONIDAZOLE 500 MG PO TABS: 500.0000 mg | ORAL_TABLET | Freq: Two times a day (BID) | ORAL | Status: DC

## 2015-07-04 MED ORDER — FLUCONAZOLE 150 MG PO TABS: 150.0000 mg | ORAL_TABLET | Freq: Every day | ORAL | Status: DC

## 2015-07-04 NOTE — MAU Provider Note (Signed)
  History     CSN: [id number]  Arrival date and time: 07/04/15 1148   First Provider Initiated Contact with Patient 07/04/15 1211      Chief Complaint  Patient presents with  . Vaginal Discharge   HPI   Kelsey Jones is a 25 year old [id number] presenting to the MAU complaining of vaginal discharge. The discharge began 2 days ago and is described as thick, white, and malodorous that is worse after having intercourse. She reports recently having unprotected sex with her partner and would like to be STD tested. She denies any fever/chills, abd pain, vaginal bleeding/itching, or increased frequency/urgency or burning with urination. LMP 06/16/2015   OB History    Gravida Para Term Preterm AB TAB SAB Ectopic Multiple Living   2 1 1  1 1    1       Past Medical History  Diagnosis Date  . Medical history non-contributory     Past Surgical History  Procedure Laterality Date  . Induced abortion      Family History  Problem Relation Age of Onset  . Diabetes Mother   . Cancer Maternal Grandmother   . Diabetes Maternal Grandmother     Social History  Substance Use Topics  . Smoking status: Former Smoker    Types: Cigars  . Smokeless tobacco: None  . Alcohol Use: Yes     Comment: Occasionally    Allergies: No Known Allergies  Prescriptions prior to admission  Medication Sig Dispense Refill Last Dose  . cephALEXin (KEFLEX) 500 MG capsule Take 1 capsule (500 mg total) by mouth 4 (four) times daily. 20 capsule 0   . ibuprofen (ADVIL,MOTRIN) 200 MG tablet Take 800 mg by mouth every 6 (six) hours as needed for moderate pain.   Past Month at Unknown time    Review of Systems  Constitutional: Negative for fever and chills.  Gastrointestinal: Negative for nausea, vomiting, abdominal pain and diarrhea.  Genitourinary: Negative for dysuria, urgency and frequency.   Physical Exam   Blood pressure 101/65, pulse 73, temperature 98.1 F (36.7 C), temperature source Oral, resp.  rate 18, last menstrual period 06/16/2015.  Physical Exam  Constitutional: She is oriented to person, place, and time. She appears well-developed and well-nourished.  HENT:  Head: Normocephalic and atraumatic.  Eyes: Pupils are equal, round, and reactive to light.  Neck: Normal range of motion. Neck supple.  Cardiovascular: Normal rate and regular rhythm.   Respiratory: Effort normal and breath sounds normal.  GI: Soft. Bowel sounds are normal. There is no tenderness.  Genitourinary: Uterus normal. Vaginal discharge found.  Neurological: She is alert and oriented to person, place, and time.  Skin: Skin is warm and dry.   Speculum exam: normal cervix with thick white discharge; no blood noted  Pelvic exam: Uterus and adnexa were nontender without palpable masses, no CMT   MAU Course  Procedures  UA- wnl Urine pregnancy test- negative  Wet prep and STD screening  Assessment and Plan  Vaginal discharge- wet prep showed bacterial vaginosis STD testing pending  Treat as outpatient with Flagyl and prophylaxis of Diflucan (per pt request)   Eartha InchJessica Hikari Tripp PA-S 07/04/2015, 12:22 PM

## 2015-07-04 NOTE — MAU Note (Signed)
Pt C/O vaginal discharge for the last 2 days, clumpy with fishy odor.  Denies pain.

## 2015-07-04 NOTE — Discharge Instructions (Signed)

## 2015-07-04 NOTE — MAU Provider Note (Signed)
History     CSN: [id number]  Arrival date and time: 07/04/15 1148   First Provider Initiated Contact with Patient 07/04/15 1211      Chief Complaint  Patient presents with  . Vaginal Discharge   HPI Kelsey Jones is a 25 y.o. female who presents with vaginal discharge. Symptoms began 2 days ago. Reports thick white discharge with foul odor. Odor worse after intercourse. Denies vaginal bleeding, vaginal irritation, abdominal pain, dyspareunia, post coital bleeding. Denies history of STI. Currently trying to conceive and not using contraception. Requesting STI testing today.   OB History    Gravida Para Term Preterm AB TAB SAB Ectopic Multiple Living   Past Medical History  Diagnosis Date  . Medical history non-contributory     Past Surgical History  Procedure Laterality Date  . Induced abortion      Family History  Problem Relation Age of Onset  . Diabetes Mother   . Cancer Maternal Grandmother   . Diabetes Maternal Grandmother     Social History  Substance Use Topics  . Smoking status: Former Smoker    Types: Cigars  . Smokeless tobacco: None  . Alcohol Use: Yes     Comment: Occasionally    Allergies: No Known Allergies  Prescriptions prior to admission  Medication Sig Dispense Refill Last Dose  . cephALEXin (KEFLEX) 500 MG capsule Take 1 capsule (500 mg total) by mouth 4 (four) times daily. (Patient not taking: Reported on 07/04/2015) 20 capsule 0 Completed Course at Unknown time    Review of Systems  Constitutional: Negative.   Gastrointestinal: Negative.   Genitourinary: Negative for dysuria.       + vaginal discharge No vaginal bleeding   Physical Exam   Blood pressure 101/65, pulse 73, temperature 98.1 F (36.7 C), temperature source Oral, resp. rate 18, last menstrual period 06/16/2015.  Physical Exam  Nursing note and vitals reviewed. Constitutional: She is oriented to person, place, and time. She appears  well-developed and well-nourished. No distress.  HENT:  Head: Normocephalic and atraumatic.  Eyes: Conjunctivae are normal. Right eye exhibits no discharge. Left eye exhibits no discharge. No scleral icterus.  Neck: Normal range of motion.  Cardiovascular: Normal rate.   Respiratory: Effort normal. No respiratory distress.  GI: Soft. She exhibits no distension. There is no tenderness.  Genitourinary: Uterus normal. Cervix exhibits no motion tenderness. Right adnexum displays no mass. Left adnexum displays no mass. No bleeding in the vagina. Vaginal discharge (thin white discharge) found.  Neurological: She is alert and oriented to person, place, and time.  Skin: Skin is warm and dry. She is not diaphoretic.  Psychiatric: She has a normal mood and affect. Her behavior is normal. Judgment and thought content normal.    MAU Course  Procedures Results for orders placed or performed during the hospital encounter of 07/04/15 (from the past 24 hour(s))  Urinalysis, Routine w reflex microscopic (not at [facility])     Status: Abnormal   Collection Time: 07/04/15 12:00 PM  Result Value Ref Range   Color, Urine YELLOW YELLOW   APPearance HAZY (A) CLEAR   Specific Gravity, Urine 1.020 1.005 - 1.030   pH 7.0 5.0 - 8.0   Glucose, UA NEGATIVE NEGATIVE mg/dL   Hgb urine dipstick NEGATIVE NEGATIVE   Bilirubin Urine NEGATIVE NEGATIVE   Ketones, ur NEGATIVE NEGATIVE mg/dL   Protein, ur NEGATIVE NEGATIVE mg/dL   Nitrite  NEGATIVE NEGATIVE   Leukocytes, UA NEGATIVE NEGATIVE  Pregnancy, urine POC     Status: None   Collection Time: 07/04/15 12:27 PM  Result Value Ref Range   Preg Test, Ur NEGATIVE NEGATIVE  Wet prep, genital     Status: Abnormal   Collection Time: 07/04/15 12:57 PM  Result Value Ref Range   Yeast Wet Prep HPF POC NONE SEEN NONE SEEN   Trich, Wet Prep NONE SEEN NONE SEEN   Clue Cells Wet Prep HPF POC PRESENT (A) NONE SEEN   WBC, Wet Prep HPF POC MANY (A) NONE SEEN   Sperm NONE SEEN    CBC     Status: None   Collection Time: 07/04/15  1:02 PM  Result Value Ref Range   WBC 5.3 4.0 - 10.5 K/uL   RBC 4.57 3.87 - 5.11 MIL/uL   Hemoglobin 12.1 12.0 - 15.0 g/dL   HCT 16.136.2 09.636.0 - 04.546.0 %   MCV 79.2 78.0 - 100.0 fL   MCH 26.5 26.0 - 34.0 pg   MCHC 33.4 30.0 - 36.0 g/dL   RDW 40.914.1 81.111.5 - 91.415.5 %   Platelets 210 150 - 400 K/uL  Rapid HIV screen (HIV 1/2 Ab+Ag)     Status: None   Collection Time: 07/04/15  1:02 PM  Result Value Ref Range   HIV-1 P24 Antigen - HIV24 NON REACTIVE NON REACTIVE   HIV 1/2 Antibodies NON REACTIVE NON REACTIVE   Interpretation (HIV Ag Ab)      A non reactive test result means that HIV 1 or HIV 2 antibodies and HIV 1 p24 antigen were not detected in the specimen.    MDM UPT negative Will treat for BV Pt requesting diflucan to have on hand  Assessment and Plan  A:  1. BV (bacterial vaginosis)   2. Screening for STD (sexually transmitted disease)     P: Discharge home Rx flagyl & diflucan GC/CT, HIV, RPR pending No alcohol or intercourse while taking flagyl  Kelsey Jones 07/04/2015, 2:31 PM

## 2015-07-05 LAB — RPR: RPR: NONREACTIVE

## 2015-07-05 LAB — GC/CHLAMYDIA PROBE AMP (~~LOC~~) NOT AT ARMC
Chlamydia: NEGATIVE
Neisseria Gonorrhea: NEGATIVE

## 2015-10-03 ENCOUNTER — Encounter (HOSPITAL_COMMUNITY): Payer: Self-pay

## 2015-10-03 ENCOUNTER — Inpatient Hospital Stay (HOSPITAL_COMMUNITY)
Admission: AD | Admit: 2015-10-03 | Discharge: 2015-10-03 | Disposition: A | Discharge status: Home / Self Care | Payer: 59 | Source: Ambulatory Visit | Attending: Obstetrics & Gynecology | Admitting: Obstetrics & Gynecology

## 2015-10-03 DIAGNOSIS — A499 Bacterial infection, unspecified: Secondary | ICD-10-CM | POA: Diagnosis not present

## 2015-10-03 DIAGNOSIS — B9689 Other specified bacterial agents as the cause of diseases classified elsewhere: Secondary | ICD-10-CM

## 2015-10-03 DIAGNOSIS — N760 Acute vaginitis: Secondary | ICD-10-CM

## 2015-10-03 LAB — URINALYSIS, ROUTINE W REFLEX MICROSCOPIC
Bilirubin Urine: NEGATIVE
Glucose, UA: NEGATIVE mg/dL
Hgb urine dipstick: NEGATIVE
KETONES UR: NEGATIVE mg/dL
LEUKOCYTES UA: NEGATIVE
NITRITE: NEGATIVE
PROTEIN: NEGATIVE mg/dL
Specific Gravity, Urine: >1.03 — ABNORMAL HIGH (ref 1.005–1.030)
pH: 5.5 (ref 5.0–8.0)

## 2015-10-03 LAB — POCT PREGNANCY, URINE: Preg Test, Ur: NEGATIVE

## 2015-10-03 LAB — WET PREP, GENITAL
Sperm: NONE SEEN
TRICH WET PREP: NONE SEEN
YEAST WET PREP: NONE SEEN

## 2015-10-03 MED ORDER — FLUCONAZOLE 150 MG PO TABS: 150.0000 mg | ORAL_TABLET | Freq: Every day | ORAL | Status: DC

## 2015-10-03 MED ORDER — METRONIDAZOLE 500 MG PO TABS: 500.0000 mg | ORAL_TABLET | Freq: Two times a day (BID) | ORAL | Status: DC

## 2015-10-03 NOTE — Discharge Instructions (Signed)
Bacterial Vaginosis °Bacterial vaginosis is a vaginal infection that occurs when the normal balance of bacteria in the vagina is disrupted. It results from an overgrowth of certain bacteria. This is the most common vaginal infection in women of childbearing age. Treatment is important to prevent complications, especially in pregnant women, as it can cause a premature delivery. °CAUSES  °Bacterial vaginosis is caused by an increase in harmful bacteria that are normally present in smaller amounts in the vagina. Several different kinds of bacteria can cause bacterial vaginosis. However, the reason that the condition develops is not fully understood. °RISK FACTORS °Certain activities or behaviors can put you at an increased risk of developing bacterial vaginosis, including: °· Having a new sex partner or multiple sex partners. °· Douching. °· Using an intrauterine device (IUD) for contraception. °Women do not get bacterial vaginosis from toilet seats, bedding, swimming pools, or contact with objects around them. °SIGNS AND SYMPTOMS  °Some women with bacterial vaginosis have no signs or symptoms. Common symptoms include: °· Grey vaginal discharge. °· A fishlike odor with discharge, especially after sexual intercourse. °· Itching or burning of the vagina and vulva. °· Burning or pain with urination. °DIAGNOSIS  °Your health care provider will take a medical history and examine the vagina for signs of bacterial vaginosis. A sample of vaginal fluid may be taken. Your health care provider will look at this sample under a microscope to check for bacteria and abnormal cells. A vaginal pH test may also be done.  °TREATMENT  °Bacterial vaginosis may be treated with antibiotic medicines. These may be given in the form of a pill or a vaginal cream. A second round of antibiotics may be prescribed if the condition comes back after treatment. Because bacterial vaginosis increases your risk for sexually transmitted diseases, getting  treated can help reduce your risk for chlamydia, gonorrhea, HIV, and herpes. °HOME CARE INSTRUCTIONS  °· Only take over-the-counter or prescription medicines as directed by your health care provider. °· If antibiotic medicine was prescribed, take it as directed. Make sure you finish it even if you start to feel better. °· During treatment, it is important that you follow these instructions: °¨ Avoid sexual activity or use condoms correctly. °¨ Do not douche. °¨ Avoid alcohol as directed by your health care provider. °¨ Avoid breastfeeding as directed by your health care provider. °SEEK MEDICAL CARE IF:  °· Your symptoms are not improving after 3 days of treatment. °· You have increased discharge or pain. °· You have a fever. °MAKE SURE YOU:  °· Understand these instructions. °· Will watch your condition. °· Will get help right away if you are not doing well or get worse. °FOR MORE INFORMATION  °Centers for Disease Control and Prevention, Division of STD Prevention: www.cdc.gov/std °American Sexual Health Association (ASHA): www.ashastd.org  °  °This information is not intended to replace advice given to you by your health care provider. Make sure you discuss any questions you have with your health care provider. °  °Document Released: 03/03/2005 Document Revised: 03/24/2014 Document Reviewed: 10/13/2012 °Elsevier Interactive Patient Education ©2016 [employer] ° °

## 2015-10-03 NOTE — MAU Provider Note (Signed)
History     CSN: [id number]  Arrival date and time: 10/03/15 4098   First Provider Initiated Contact with Patient 10/03/15 1918      Chief Complaint  Patient presents with  . Vaginal Discharge   HPI Kelsey Jones is a 25 y.o. female who presents with vaginal discharge. Reports thick yellow vaginal discharge x 2 weeks since having unprotected sex with her partner. States she always gets BV when she has unprotected sex. Discharge is malodorous. Denies vaginal irritation, abdominal pain, fever, dyspareunia, postcoital bleeding, dysuria.   OB History    Gravida Para Term Preterm AB TAB SAB Ectopic Multiple Living   Past Medical History  Diagnosis Date  . Medical history non-contributory     Past Surgical History  Procedure Laterality Date  . Induced abortion      Family History  Problem Relation Age of Onset  . Diabetes Mother   . Cancer Maternal Grandmother   . Diabetes Maternal Grandmother     Social History  Substance Use Topics  . Smoking status: Former Smoker    Types: Cigars  . Smokeless tobacco: None  . Alcohol Use: Yes     Comment: Occasionally    Allergies: No Known Allergies  Prescriptions prior to admission  Medication Sig Dispense Refill Last Dose  . fluconazole (DIFLUCAN) 150 MG tablet Take 1 tablet (150 mg total) by mouth daily. 1 tablet 1   . metroNIDAZOLE (FLAGYL) 500 MG tablet Take 1 tablet (500 mg total) by mouth 2 (two) times daily. 14 tablet 0     Review of Systems  Constitutional: Negative.   Gastrointestinal: Negative.   Genitourinary: Negative for dysuria.       + vaginal discharge No vaginal bleeding, dyspareunia, or postcoital bleeding   Physical Exam   Blood pressure 120/74, pulse 87, temperature 98.5 F (36.9 C), temperature source Oral, resp. rate 18.  Physical Exam  Nursing note and vitals reviewed. Constitutional: She is oriented to person, place, and time. She appears well-developed and  well-nourished. No distress.  HENT:  Head: Normocephalic and atraumatic.  Eyes: Conjunctivae are normal. Right eye exhibits no discharge. Left eye exhibits no discharge. No scleral icterus.  Neck: Normal range of motion.  Cardiovascular: Normal rate.   Respiratory: Effort normal. No respiratory distress.  Neurological: She is alert and oriented to person, place, and time.  Skin: Skin is warm and dry. She is not diaphoretic.  Psychiatric: She has a normal mood and affect. Her behavior is normal. Judgment and thought content normal.    MAU Course  Procedures Results for orders placed or performed during the hospital encounter of 10/03/15 (from the past 24 hour(s))  Urinalysis, Routine w reflex microscopic (not at [facility])     Status: Abnormal   Collection Time: 10/03/15  7:01 PM  Result Value Ref Range   Color, Urine YELLOW YELLOW   APPearance CLEAR CLEAR   Specific Gravity, Urine >1.030 (H) 1.005 - 1.030   pH 5.5 5.0 - 8.0   Glucose, UA NEGATIVE NEGATIVE mg/dL   Hgb urine dipstick NEGATIVE NEGATIVE   Bilirubin Urine NEGATIVE NEGATIVE   Ketones, ur NEGATIVE NEGATIVE mg/dL   Protein, ur NEGATIVE NEGATIVE mg/dL   Nitrite NEGATIVE NEGATIVE   Leukocytes, UA NEGATIVE NEGATIVE  Pregnancy, urine POC     Status: None   Collection Time: 10/03/15  7:08 PM  Result Value Ref Range   Preg Test,  Ur NEGATIVE NEGATIVE  Wet prep, genital     Status: Abnormal   Collection Time: 10/03/15  7:34 PM  Result Value Ref Range   Yeast Wet Prep HPF POC NONE SEEN NONE SEEN   Trich, Wet Prep NONE SEEN NONE SEEN   Clue Cells Wet Prep HPF POC PRESENT (A) NONE SEEN   WBC, Wet Prep HPF POC FEW (A) NONE SEEN   Sperm NONE SEEN     MDM UPT negative Declined pelvic exam Requesting diflucan to have on hand; states she always gets yeast infection when taking flagyl Assessment and Plan  A: 1. BV (bacterial vaginosis)     P; Discharge home Rx flagyl (no alcohol) & diflucan GC/CT pending Keep f/u with  Tim LairFemina  Dillard Pascal 10/03/2015, 7:18 PM

## 2015-10-03 NOTE — MAU Note (Signed)
Patient presents with c/o vaginal discharge for the past 2 weeks. Patient states that it smells fishy.

## 2015-10-04 LAB — GC/CHLAMYDIA PROBE AMP (~~LOC~~) NOT AT ARMC
Chlamydia: NEGATIVE
NEISSERIA GONORRHEA: NEGATIVE

## 2015-10-16 ENCOUNTER — Ambulatory Visit (INDEPENDENT_AMBULATORY_CARE_PROVIDER_SITE_OTHER): Payer: 59 | Admitting: Obstetrics & Gynecology

## 2015-10-16 ENCOUNTER — Ambulatory Visit: Payer: Self-pay | Admitting: Obstetrics & Gynecology

## 2015-10-16 ENCOUNTER — Encounter: Payer: Self-pay | Admitting: Obstetrics & Gynecology

## 2015-10-16 VITALS — BP 101/66 | HR 93 | Temp 98.3°F | Ht 68.0 in | Wt 156.3 lb

## 2015-10-16 DIAGNOSIS — Z113 Encounter for screening for infections with a predominantly sexual mode of transmission: Secondary | ICD-10-CM

## 2015-10-16 DIAGNOSIS — Z3041 Encounter for surveillance of contraceptive pills: Secondary | ICD-10-CM

## 2015-10-16 DIAGNOSIS — Z01419 Encounter for gynecological examination (general) (routine) without abnormal findings: Secondary | ICD-10-CM | POA: Diagnosis not present

## 2015-10-16 DIAGNOSIS — Z1389 Encounter for screening for other disorder: Secondary | ICD-10-CM

## 2015-10-16 DIAGNOSIS — Z7189 Other specified counseling: Secondary | ICD-10-CM

## 2015-10-16 DIAGNOSIS — Z3202 Encounter for pregnancy test, result negative: Secondary | ICD-10-CM | POA: Diagnosis not present

## 2015-10-16 DIAGNOSIS — Z7185 Encounter for immunization safety counseling: Secondary | ICD-10-CM

## 2015-10-16 LAB — POCT URINALYSIS DIPSTICK
Bilirubin, UA: NEGATIVE
Glucose, UA: NEGATIVE
Ketones, UA: NEGATIVE
NITRITE UA: NEGATIVE
PH UA: 7
Protein, UA: NEGATIVE
RBC UA: NEGATIVE
Spec Grav, UA: 1.015
UROBILINOGEN UA: 0.2

## 2015-10-16 LAB — POCT URINE PREGNANCY: PREG TEST UR: NEGATIVE

## 2015-10-16 MED ORDER — NORGESTIMATE-ETH ESTRADIOL 0.25-35 MG-MCG PO TABS: 1.0000 | ORAL_TABLET | Freq: Every day | ORAL | 11 refills | Status: DC

## 2015-10-16 NOTE — Patient Instructions (Addendum)
Thank you for enrolling in [facility]. Please follow the instructions below to securely access your online medical record. MyChart allows you to send messages to your doctor, view your test results, manage appointments, and more.   How Do I Sign Up? 1. In your Internet browser, go to [employer] and enter [email or web address]. 2. Click on the Sign Up Now link in the Sign In box. You will see the New Member Sign Up page. 3. Enter your MyChart Access Code exactly as it appears below. You will not need to use this code after you've completed the sign-up process. If you do not sign up before the expiration date, you must request a new code.  MyChart Access Code: [email or web address] Expires: 10/29/2015  3:21 AM  4. Enter your Social Security Number ([email or web address]) and Date of Birth (mm/dd/yyyy) as indicated and click Submit. You will be taken to the next sign-up page. 5. Create a MyChart ID. This will be your MyChart login ID and cannot be changed, so think of one that is secure and easy to remember. 6. Create a MyChart password. You can change your password at any time. 7. Enter your Password Reset Question and Answer. This can be used at a later time if you forget your password.  8. Enter your e-mail address. You will receive e-mail notification when new information is available in [facility]. 9. Click Sign Up. You can now view your medical record.   Additional Information Remember, MyChart is NOT to be used for urgent needs. For medical emergencies, dial 911.   HPV (Human Papillomavirus) Vaccine--Gardasil-9:  1. Why get vaccinated? Gardasil-9 prevents human papillomavirus (HPV) types that cause many cancers, including:  cervical cancer in females,  vaginal and vulvar cancers in females,  anal cancer in females and males,  throat cancer in females and males, and  penile cancer in males. In addition, Gardasil-9 prevents HPV types that cause genital warts in both females and  males. In the [place] about 12,000 women get cervical cancer every year, and about 4,000 women die from it. Gaylyn Lambert can prevent most of these cases of cervical cancer. Vaccination is not a substitute for cervical cancer screening. This vaccine does not protect against all HPV types that can cause cervical cancer. Women should still get regular Pap tests. HPV infection usually comes from sexual contact, and most people will become infected at some point in their life. About 14 million Americans, including teens, get infected every year. Most infections will go away and not cause serious problems. But thousands of women and men get cancer and diseases from HPV. 2. HPV vaccine Gaylyn Lambert is an FDA-approved HPV vaccine. It is recommended for both males and females. It is routinely given at 102 or 25 years of age, but it may be given beginning at age 16 years through age 108 years. Three doses of Gardasil-9 are recommended with the second dose given 1-2 months after the first dose and the third dose given 6 months after the first dose. 3. Some people should not get this vaccine  Anyone who has had a severe, life-threatening allergic reaction to a dose of HPV vaccine should not get another dose.  Anyone who has a severe (life threatening) allergy to any component of HPV vaccine should not get the vaccine. Tell your doctor if you have any severe allergies that you know of, including a severe allergy to yeast.  HPV vaccine is not recommended for pregnant women. If you learn that you were pregnant when you were  vaccinated, there is no reason to expect any problems for you or your baby. Any woman who learns she was pregnant when she got Gardasil-9 vaccine is encouraged to contact the manufacturer's registry for HPV vaccination during pregnancy at [phone]. Women who are breastfeeding may be vaccinated.  If you have a mild illness, such as a cold, you can probably get the vaccine today. If you are  moderately or severely ill, you should probably wait until you recover. Your doctor can advise you. 4. Risks of a vaccine reaction With any medicine, including vaccines, there is a chance of side effects. These are usually mild and go away on their own, but serious reactions are also possible. Most people who get HPV vaccine do not have any serious problems with it. Mild or moderate problems following Gardasil-9:  Reactions in the arm where the shot was given:  Soreness (about 9 people in 10)  Redness or swelling (about 1 person in 3)  Fever:  Mild (100F) (about 1 person in 10)  Moderate (102F) (about 1 person in 65)  Other problems:  Headache (about 1 person in 3) Problems that could happen after any injected vaccine:  People sometimes faint after a medical procedure, including vaccination. Sitting or lying down for about 15 minutes can help prevent fainting, and injuries caused by a fall. Tell your doctor if you feel dizzy, or have vision changes or ringing in the ears.  Some people get severe pain in the shoulder and have difficulty moving the arm where a shot was given. This happens very rarely.  Any medication can cause a severe allergic reaction. Such reactions from a vaccine are very rare, estimated at about 1 in a million doses, and would happen within a few minutes to a few hours after the vaccination. As with any medicine, there is a very remote chance of a vaccine causing a serious injury or death. The safety of vaccines is always being monitored. For more information, visit: http://www.aguilar.org/. 5. What if there is a serious reaction? What should I look for? Look for anything that concerns you, such as signs of a severe allergic reaction, very high fever, or unusual behavior. Signs of a severe allergic reaction can include hives, swelling of the face and throat, difficulty breathing, a fast heartbeat, dizziness, and weakness. These would usually start a few  minutes to a few hours after the vaccination. What should I do? If you think it is a severe allergic reaction or other emergency that can't wait, call 9-1-1 or get to the nearest hospital. Otherwise, call your doctor. Afterward, the reaction should be reported to the "Vaccine Adverse Event Reporting System" (VAERS). Your doctor might file this report, or you can do it yourself through the VAERS web site at www.vaers.SamedayNews.es, or by calling (360)205-5297. VAERS does not give medical advice. 6. The National Vaccine Injury Compensation Program The [employer] Vaccine Injury Compensation Program (VICP) is a federal program that was created to compensate people who may have been injured by certain vaccines. Persons who believe they may have been injured by a vaccine can learn about the program and about filing a claim by calling 262 475 5076 or visiting the [employer] website at GoldCloset.com.ee. There is a time limit to file a claim for compensation. 7. How can I learn more?  Ask your health care provider. He or she can give you the vaccine package insert or suggest other sources of information.  Call your local or state health department.  Contact the Centers for  Disease Control and Prevention (CDC):  Call 618-513-5824 (1-800-CDC-INFO) or  Visit CDC's website at http://sweeney-todd.com/ Vaccine Information Statement HPV Vaccine Gaylyn Lambert) 06/15/14   This information is not intended to replace advice given to you by your health care provider. Make sure you discuss any questions you have with your health care provider.   Document Released: 09/28/2013 Document Revised: 07/18/2014 Document Reviewed: 09/28/2013 Elsevier Interactive Patient Education 2016 [place] for Adults, Female A healthy lifestyle and preventive care can promote health and wellness. Preventive health guidelines for women include the following key practices.  A routine yearly physical is a good way  to check with your health care provider about your health and preventive screening. It is a chance to share any concerns and updates on your health and to receive a thorough exam.  Visit your dentist for a routine exam and preventive care every 6 months. Brush your teeth twice a day and floss once a day. Good oral hygiene prevents tooth decay and gum disease.  The frequency of eye exams is based on your age, health, family medical history, use of contact lenses, and other factors. Follow your health care provider's recommendations for frequency of eye exams.  Eat a healthy diet. Foods like vegetables, fruits, whole grains, low-fat dairy products, and lean protein foods contain the nutrients you need without too many calories. Decrease your intake of foods high in solid fats, added sugars, and salt. Eat the right amount of calories for you.Get information about a proper diet from your health care provider, if necessary.  Regular physical exercise is one of the most important things you can do for your health. Most adults should get at least 150 minutes of moderate-intensity exercise (any activity that increases your heart rate and causes you to sweat) each week. In addition, most adults need muscle-strengthening exercises on 2 or more days a week.  Maintain a healthy weight. The body mass index (BMI) is a screening tool to identify possible weight problems. It provides an estimate of body fat based on height and weight. Your health care provider can find your BMI and can help you achieve or maintain a healthy weight.For adults 20 years and older:  A BMI below 18.5 is considered underweight.  A BMI of 18.5 to 24.9 is normal.  A BMI of 25 to 29.9 is considered overweight.  A BMI of 30 and above is considered obese.  Maintain normal blood lipids and cholesterol levels by exercising and minimizing your intake of saturated fat. Eat a balanced diet with plenty of fruit and vegetables. Blood tests for  lipids and cholesterol should begin at age 27 and be repeated every 5 years. If your lipid or cholesterol levels are high, you are over 50, or you are at high risk for heart disease, you may need your cholesterol levels checked more frequently.Ongoing high lipid and cholesterol levels should be treated with medicines if diet and exercise are not working.  If you smoke, find out from your health care provider how to quit. If you do not use tobacco, do not start.  Lung cancer screening is recommended for adults aged 62-80 years who are at high risk for developing lung cancer because of a history of smoking. A yearly low-dose CT scan of the lungs is recommended for people who have at least a 30-pack-year history of smoking and are a current smoker or have quit within the past 15 years. A pack year of smoking is smoking an average  of 1 pack of cigarettes a day for 1 year (for example: 1 pack a day for 30 years or 2 packs a day for 15 years). Yearly screening should continue until the smoker has stopped smoking for at least 15 years. Yearly screening should be stopped for people who develop a health problem that would prevent them from having lung cancer treatment.  If you are pregnant, do not drink alcohol. If you are breastfeeding, be very cautious about drinking alcohol. If you are not pregnant and choose to drink alcohol, do not have more than 1 drink per day. One drink is considered to be 12 ounces (355 mL) of beer, 5 ounces (148 mL) of wine, or 1.5 ounces (44 mL) of liquor.  Avoid use of street drugs. Do not share needles with anyone. Ask for help if you need support or instructions about stopping the use of drugs.  High blood pressure causes heart disease and increases the risk of stroke. Your blood pressure should be checked at least every 1 to 2 years. Ongoing high blood pressure should be treated with medicines if weight loss and exercise do not work.  If you are 51-98 years old, ask your health  care provider if you should take aspirin to prevent strokes.  Diabetes screening is done by taking a blood sample to check your blood glucose level after you have not eaten for a certain period of time (fasting). If you are not overweight and you do not have risk factors for diabetes, you should be screened once every 3 years starting at age 28. If you are overweight or obese and you are 28-32 years of age, you should be screened for diabetes every year as part of your cardiovascular risk assessment.  Breast cancer screening is essential preventive care for women. You should practice "breast self-awareness." This means understanding the normal appearance and feel of your breasts and may include breast self-examination. Any changes detected, no matter how small, should be reported to a health care provider. Women in their 37s and 30s should have a clinical breast exam (CBE) by a health care provider as part of a regular health exam every 1 to 3 years. After age 8, women should have a CBE every year. Starting at age 53, women should consider having a mammogram (breast X-ray test) every year. Women who have a family history of breast cancer should talk to their health care provider about genetic screening. Women at a high risk of breast cancer should talk to their health care providers about having an MRI and a mammogram every year.  Breast cancer gene (BRCA)-related cancer risk assessment is recommended for women who have family members with BRCA-related cancers. BRCA-related cancers include breast, ovarian, tubal, and peritoneal cancers. Having family members with these cancers may be associated with an increased risk for harmful changes (mutations) in the breast cancer genes BRCA1 and BRCA2. Results of the assessment will determine the need for genetic counseling and BRCA1 and BRCA2 testing.  Your health care provider may recommend that you be screened regularly for cancer of the pelvic organs (ovaries,  uterus, and vagina). This screening involves a pelvic examination, including checking for microscopic changes to the surface of your cervix (Pap test). You may be encouraged to have this screening done every 3 years, beginning at age 49.  For women ages 7-65, health care providers may recommend pelvic exams and Pap testing every 3 years, or they may recommend the Pap and pelvic exam, combined with testing  for human papilloma virus (HPV), every 5 years. Some types of HPV increase your risk of cervical cancer. Testing for HPV may also be done on women of any age with unclear Pap test results.  Other health care providers may not recommend any screening for nonpregnant women who are considered low risk for pelvic cancer and who do not have symptoms. Ask your health care provider if a screening pelvic exam is right for you.  If you have had past treatment for cervical cancer or a condition that could lead to cancer, you need Pap tests and screening for cancer for at least 20 years after your treatment. If Pap tests have been discontinued, your risk factors (such as having a new sexual partner) need to be reassessed to determine if screening should resume. Some women have medical problems that increase the chance of getting cervical cancer. In these cases, your health care provider may recommend more frequent screening and Pap tests.  Colorectal cancer can be detected and often prevented. Most routine colorectal cancer screening begins at the age of 73 years and continues through age 41 years. However, your health care provider may recommend screening at an earlier age if you have risk factors for colon cancer. On a yearly basis, your health care provider may provide home test kits to check for hidden blood in the stool. Use of a small camera at the end of a tube, to directly examine the colon (sigmoidoscopy or colonoscopy), can detect the earliest forms of colorectal cancer. Talk to your health care provider  about this at age 59, when routine screening begins. Direct exam of the colon should be repeated every 5-10 years through age 14 years, unless early forms of precancerous polyps or small growths are found.  People who are at an increased risk for hepatitis B should be screened for this virus. You are considered at high risk for hepatitis B if:  You were born in a country where hepatitis B occurs often. Talk with your health care provider about which countries are considered high risk.  Your parents were born in a high-risk country and you have not received a shot to protect against hepatitis B (hepatitis B vaccine).  You have HIV or AIDS.  You use needles to inject street drugs.  You live with, or have sex with, someone who has hepatitis B.  You get hemodialysis treatment.  You take certain medicines for conditions like cancer, organ transplantation, and autoimmune conditions.  Hepatitis C blood testing is recommended for all people born from [birth year] through 1965 and any individual with known risks for hepatitis C.  Practice safe sex. Use condoms and avoid high-risk sexual practices to reduce the spread of sexually transmitted infections (STIs). STIs include gonorrhea, chlamydia, syphilis, trichomonas, herpes, HPV, and human immunodeficiency virus (HIV). Herpes, HIV, and HPV are viral illnesses that have no cure. They can result in disability, cancer, and death.  You should be screened for sexually transmitted illnesses (STIs) including gonorrhea and chlamydia if:  You are sexually active and are younger than 24 years.  You are older than 24 years and your health care provider tells you that you are at risk for this type of infection.  Your sexual activity has changed since you were last screened and you are at an increased risk for chlamydia or gonorrhea. Ask your health care provider if you are at risk.  If you are at risk of being infected with HIV, it is recommended that you take a  prescription medicine daily to prevent HIV infection. This is called preexposure prophylaxis (PrEP). You are considered at risk if:  You are sexually active and do not regularly use condoms or know the HIV status of your partner(s).  You take drugs by injection.  You are sexually active with a partner who has HIV.  Talk with your health care provider about whether you are at high risk of being infected with HIV. If you choose to begin PrEP, you should first be tested for HIV. You should then be tested every 3 months for as long as you are taking PrEP.  Osteoporosis is a disease in which the bones lose minerals and strength with aging. This can result in serious bone fractures or breaks. The risk of osteoporosis can be identified using a bone density scan. Women ages 48 years and over and women at risk for fractures or osteoporosis should discuss screening with their health care providers. Ask your health care provider whether you should take a calcium supplement or vitamin D to reduce the rate of osteoporosis.  Menopause can be associated with physical symptoms and risks. Hormone replacement therapy is available to decrease symptoms and risks. You should talk to your health care provider about whether hormone replacement therapy is right for you.  Use sunscreen. Apply sunscreen liberally and repeatedly throughout the day. You should seek shade when your shadow is shorter than you. Protect yourself by wearing long sleeves, pants, a wide-brimmed hat, and sunglasses year round, whenever you are outdoors.  Once a month, do a whole body skin exam, using a mirror to look at the skin on your back. Tell your health care provider of new moles, moles that have irregular borders, moles that are larger than a pencil eraser, or moles that have changed in shape or color.  Stay current with required vaccines (immunizations).  Influenza vaccine. All adults should be immunized every year.  Tetanus, diphtheria,  and acellular pertussis (Td, Tdap) vaccine. Pregnant women should receive 1 dose of Tdap vaccine during each pregnancy. The dose should be obtained regardless of the length of time since the last dose. Immunization is preferred during the 27th-36th week of gestation. An adult who has not previously received Tdap or who does not know her vaccine status should receive 1 dose of Tdap. This initial dose should be followed by tetanus and diphtheria toxoids (Td) booster doses every 10 years. Adults with an unknown or incomplete history of completing a 3-dose immunization series with Td-containing vaccines should begin or complete a primary immunization series including a Tdap dose. Adults should receive a Td booster every 10 years.  Varicella vaccine. An adult without evidence of immunity to varicella should receive 2 doses or a second dose if she has previously received 1 dose. Pregnant females who do not have evidence of immunity should receive the first dose after pregnancy. This first dose should be obtained before leaving the health care facility. The second dose should be obtained 4-8 weeks after the first dose.  Human papillomavirus (HPV) vaccine. Females aged 13-26 years who have not received the vaccine previously should obtain the 3-dose series. The vaccine is not recommended for use in pregnant females. However, pregnancy testing is not needed before receiving a dose. If a female is found to be pregnant after receiving a dose, no treatment is needed. In that case, the remaining doses should be delayed until after the pregnancy. Immunization is recommended for any person with an immunocompromised condition through the age of 51  years if she did not get any or all doses earlier. During the 3-dose series, the second dose should be obtained 4-8 weeks after the first dose. The third dose should be obtained 24 weeks after the first dose and 16 weeks after the second dose.  Zoster vaccine. One dose is  recommended for adults aged 72 years or older unless certain conditions are present.  Measles, mumps, and rubella (MMR) vaccine. Adults born before [birth year] generally are considered immune to measles and mumps. Adults born in [birth year] or later should have 1 or more doses of MMR vaccine unless there is a contraindication to the vaccine or there is laboratory evidence of immunity to each of the three diseases. A routine second dose of MMR vaccine should be obtained at least 28 days after the first dose for students attending postsecondary schools, health care workers, or international travelers. People who received inactivated measles vaccine or an unknown type of measles vaccine during 1963-1967 should receive 2 doses of MMR vaccine. People who received inactivated mumps vaccine or an unknown type of mumps vaccine before 1979 and are at high risk for mumps infection should consider immunization with 2 doses of MMR vaccine. For females of childbearing age, rubella immunity should be determined. If there is no evidence of immunity, females who are not pregnant should be vaccinated. If there is no evidence of immunity, females who are pregnant should delay immunization until after pregnancy. Unvaccinated health care workers born before 45 who lack laboratory evidence of measles, mumps, or rubella immunity or laboratory confirmation of disease should consider measles and mumps immunization with 2 doses of MMR vaccine or rubella immunization with 1 dose of MMR vaccine.  Pneumococcal 13-valent conjugate (PCV13) vaccine. When indicated, a person who is uncertain of his immunization history and has no record of immunization should receive the PCV13 vaccine. All adults 22 years of age and older should receive this vaccine. An adult aged 67 years or older who has certain medical conditions and has not been previously immunized should receive 1 dose of PCV13 vaccine. This PCV13 should be followed with a dose of pneumococcal  polysaccharide (PPSV23) vaccine. Adults who are at high risk for pneumococcal disease should obtain the PPSV23 vaccine at least 8 weeks after the dose of PCV13 vaccine. Adults older than 25 years of age who have normal immune system function should obtain the PPSV23 vaccine dose at least 1 year after the dose of PCV13 vaccine.  Pneumococcal polysaccharide (PPSV23) vaccine. When PCV13 is also indicated, PCV13 should be obtained first. All adults aged 73 years and older should be immunized. An adult younger than age 71 years who has certain medical conditions should be immunized. Any person who resides in a nursing home or long-term care facility should be immunized. An adult smoker should be immunized. People with an immunocompromised condition and certain other conditions should receive both PCV13 and PPSV23 vaccines. People with human immunodeficiency virus (HIV) infection should be immunized as soon as possible after diagnosis. Immunization during chemotherapy or radiation therapy should be avoided. Routine use of PPSV23 vaccine is not recommended for American Indians, [place] Natives, or people younger than 65 years unless there are medical conditions that require PPSV23 vaccine. When indicated, people who have unknown immunization and have no record of immunization should receive PPSV23 vaccine. One-time revaccination 5 years after the first dose of PPSV23 is recommended for people aged 19-64 years who have chronic kidney failure, nephrotic syndrome, asplenia, or immunocompromised conditions. People who received 1-2  doses of PPSV23 before age 56 years should receive another dose of PPSV23 vaccine at age 73 years or later if at least 5 years have passed since the previous dose. Doses of PPSV23 are not needed for people immunized with PPSV23 at or after age 75 years.  Meningococcal vaccine. Adults with asplenia or persistent complement component deficiencies should receive 2 doses of quadrivalent meningococcal  conjugate (MenACWY-D) vaccine. The doses should be obtained at least 2 months apart. Microbiologists working with certain meningococcal bacteria, [place] recruits, people at risk during an outbreak, and people who travel to or live in countries with a high rate of meningitis should be immunized. A first-year college student up through age 74 years who is living in a residence hall should receive a dose if she did not receive a dose on or after her 16th birthday. Adults who have certain high-risk conditions should receive one or more doses of vaccine.  Hepatitis A vaccine. Adults who wish to be protected from this disease, have certain high-risk conditions, work with hepatitis A-infected animals, work in hepatitis A research labs, or travel to or work in countries with a high rate of hepatitis A should be immunized. Adults who were previously unvaccinated and who anticipate close contact with an international adoptee during the first 60 days after arrival in the [place] from a country with a high rate of hepatitis A should be immunized.  Hepatitis B vaccine. Adults who wish to be protected from this disease, have certain high-risk conditions, may be exposed to blood or other infectious body fluids, are household contacts or sex partners of hepatitis B positive people, are clients or workers in certain care facilities, or travel to or work in countries with a high rate of hepatitis B should be immunized.  Haemophilus influenzae type b (Hib) vaccine. A previously unvaccinated person with asplenia or sickle cell disease or having a scheduled splenectomy should receive 1 dose of Hib vaccine. Regardless of previous immunization, a recipient of a hematopoietic stem cell transplant should receive a 3-dose series 6-12 months after her successful transplant. Hib vaccine is not recommended for adults with HIV infection. Preventive Services / Frequency Ages 87 to 39 years  Blood pressure check.** / Every 3-5  years.  Lipid and cholesterol check.** / Every 5 years beginning at age 51.  Clinical breast exam.** / Every 3 years for women in their 43s and 35s.  BRCA-related cancer risk assessment.** / For women who have family members with a BRCA-related cancer (breast, ovarian, tubal, or peritoneal cancers).  Pap test.** / Every 2 years from ages 41 through 91. Every 3 years starting at age 100 through age 3 or 79 with a history of 3 consecutive normal Pap tests.  HPV screening.** / Every 3 years from ages 74 through ages 58 to 48 with a history of 3 consecutive normal Pap tests.  Hepatitis C blood test.** / For any individual with known risks for hepatitis C.  Skin self-exam. / Monthly.  Influenza vaccine. / Every year.  Tetanus, diphtheria, and acellular pertussis (Tdap, Td) vaccine.** / Consult your health care provider. Pregnant women should receive 1 dose of Tdap vaccine during each pregnancy. 1 dose of Td every 10 years.  Varicella vaccine.** / Consult your health care provider. Pregnant females who do not have evidence of immunity should receive the first dose after pregnancy.  HPV vaccine. / 3 doses over 6 months, if 54 and younger. The vaccine is not recommended for use in pregnant females.  However, pregnancy testing is not needed before receiving a dose.  Measles, mumps, rubella (MMR) vaccine.** / You need at least 1 dose of MMR if you were born in 1957 or later. You may also need a 2nd dose. For females of childbearing age, rubella immunity should be determined. If there is no evidence of immunity, females who are not pregnant should be vaccinated. If there is no evidence of immunity, females who are pregnant should delay immunization until after pregnancy.  Pneumococcal 13-valent conjugate (PCV13) vaccine.** / Consult your health care provider.  Pneumococcal polysaccharide (PPSV23) vaccine.** / 1 to 2 doses if you smoke cigarettes or if you have certain conditions.  Meningococcal  vaccine.** / 1 dose if you are age 75 to 45 years and a Market researcher living in a residence hall, or have one of several medical conditions, you need to get vaccinated against meningococcal disease. You may also need additional booster doses.  Hepatitis A vaccine.** / Consult your health care provider.  Hepatitis B vaccine.** / Consult your health care provider.  Haemophilus influenzae type b (Hib) vaccine.** / Consult your health care provider. Ages 43 to 36 years  Blood pressure check.** / Every year.  Lipid and cholesterol check.** / Every 5 years beginning at age 40 years.  Lung cancer screening. / Every year if you are aged 12-80 years and have a 30-pack-year history of smoking and currently smoke or have quit within the past 15 years. Yearly screening is stopped once you have quit smoking for at least 15 years or develop a health problem that would prevent you from having lung cancer treatment.  Clinical breast exam.** / Every year after age 35 years.  BRCA-related cancer risk assessment.** / For women who have family members with a BRCA-related cancer (breast, ovarian, tubal, or peritoneal cancers).  Mammogram.** / Every year beginning at age 43 years and continuing for as long as you are in good health. Consult with your health care provider.  Pap test.** / Every 3 years starting at age 90 years through age 74 or 94 years with a history of 3 consecutive normal Pap tests.  HPV screening.** / Every 3 years from ages 32 years through ages 18 to 81 years with a history of 3 consecutive normal Pap tests.  Fecal occult blood test (FOBT) of stool. / Every year beginning at age 13 years and continuing until age 51 years. You may not need to do this test if you get a colonoscopy every 10 years.  Flexible sigmoidoscopy or colonoscopy.** / Every 5 years for a flexible sigmoidoscopy or every 10 years for a colonoscopy beginning at age 52 years and continuing until age 60  years.  Hepatitis C blood test.** / For all people born from [birth year] through 1965 and any individual with known risks for hepatitis C.  Skin self-exam. / Monthly.  Influenza vaccine. / Every year.  Tetanus, diphtheria, and acellular pertussis (Tdap/Td) vaccine.** / Consult your health care provider. Pregnant women should receive 1 dose of Tdap vaccine during each pregnancy. 1 dose of Td every 10 years.  Varicella vaccine.** / Consult your health care provider. Pregnant females who do not have evidence of immunity should receive the first dose after pregnancy.  Zoster vaccine.** / 1 dose for adults aged 74 years or older.  Measles, mumps, rubella (MMR) vaccine.** / You need at least 1 dose of MMR if you were born in 1957 or later. You may also need a second dose. For females of  childbearing age, rubella immunity should be determined. If there is no evidence of immunity, females who are not pregnant should be vaccinated. If there is no evidence of immunity, females who are pregnant should delay immunization until after pregnancy.  Pneumococcal 13-valent conjugate (PCV13) vaccine.** / Consult your health care provider.  Pneumococcal polysaccharide (PPSV23) vaccine.** / 1 to 2 doses if you smoke cigarettes or if you have certain conditions.  Meningococcal vaccine.** / Consult your health care provider.  Hepatitis A vaccine.** / Consult your health care provider.  Hepatitis B vaccine.** / Consult your health care provider.  Haemophilus influenzae type b (Hib) vaccine.** / Consult your health care provider. Ages 29 years and over  Blood pressure check.** / Every year.  Lipid and cholesterol check.** / Every 5 years beginning at age 35 years.  Lung cancer screening. / Every year if you are aged 21-80 years and have a 30-pack-year history of smoking and currently smoke or have quit within the past 15 years. Yearly screening is stopped once you have quit smoking for at least 15 years or develop  a health problem that would prevent you from having lung cancer treatment.  Clinical breast exam.** / Every year after age 81 years.  BRCA-related cancer risk assessment.** / For women who have family members with a BRCA-related cancer (breast, ovarian, tubal, or peritoneal cancers).  Mammogram.** / Every year beginning at age 27 years and continuing for as long as you are in good health. Consult with your health care provider.  Pap test.** / Every 3 years starting at age 15 years through age 19 or 31 years with 3 consecutive normal Pap tests. Testing can be stopped between 65 and 70 years with 3 consecutive normal Pap tests and no abnormal Pap or HPV tests in the past 10 years.  HPV screening.** / Every 3 years from ages 61 years through ages 52 or 46 years with a history of 3 consecutive normal Pap tests. Testing can be stopped between 65 and 70 years with 3 consecutive normal Pap tests and no abnormal Pap or HPV tests in the past 10 years.  Fecal occult blood test (FOBT) of stool. / Every year beginning at age 3 years and continuing until age 73 years. You may not need to do this test if you get a colonoscopy every 10 years.  Flexible sigmoidoscopy or colonoscopy.** / Every 5 years for a flexible sigmoidoscopy or every 10 years for a colonoscopy beginning at age 35 years and continuing until age 55 years.  Hepatitis C blood test.** / For all people born from [birth year] through 1965 and any individual with known risks for hepatitis C.  Osteoporosis screening.** / A one-time screening for women ages 83 years and over and women at risk for fractures or osteoporosis.  Skin self-exam. / Monthly.  Influenza vaccine. / Every year.  Tetanus, diphtheria, and acellular pertussis (Tdap/Td) vaccine.** / 1 dose of Td every 10 years.  Varicella vaccine.** / Consult your health care provider.  Zoster vaccine.** / 1 dose for adults aged 34 years or older.  Pneumococcal 13-valent conjugate (PCV13)  vaccine.** / Consult your health care provider.  Pneumococcal polysaccharide (PPSV23) vaccine.** / 1 dose for all adults aged 37 years and older.  Meningococcal vaccine.** / Consult your health care provider.  Hepatitis A vaccine.** / Consult your health care provider.  Hepatitis B vaccine.** / Consult your health care provider.  Haemophilus influenzae type b (Hib) vaccine.** / Consult your health care provider. ** Family history  and personal history of risk and conditions may change your health care provider's recommendations.   This information is not intended to replace advice given to you by your health care provider. Make sure you discuss any questions you have with your health care provider.   Document Released: 04/29/2001 Document Revised: 03/24/2014 Document Reviewed: 07/29/2010 Elsevier Interactive Patient Education [employer].

## 2015-10-16 NOTE — Progress Notes (Signed)
GYNECOLOGY CLINIC ANNUAL PREVENTATIVE CARE ENCOUNTER NOTE  Subjective:   Kelsey Jones is a 25 y.o. G21P1011 female here for a routine annual gynecologic exam.  Current complaints: none.  Desires STI screening today.   Denies abnormal vaginal bleeding, discharge, pelvic pain, problems with intercourse or other gynecologic concerns.    Gynecologic History Patient's last menstrual period was 09/03/2015 (approximate). Contraception: none. Has used OCPs in past, and desires refill. Last Pap: 2016. Results were: normal  Obstetric History OB History  Gravida Para Term Preterm AB Living  2 1 1   1 1   SAB TAB Ectopic Multiple Live Births    1          # Outcome Date GA Lbr Len/2nd Weight Sex Delivery Anes PTL Lv  2 TAB           1 Term               Past Medical History:  Diagnosis Date  . Medical history non-contributory     Past Surgical History:  Procedure Laterality Date  . INDUCED ABORTION      No current outpatient prescriptions on file prior to visit.   No current facility-administered medications on file prior to visit.     No Known Allergies  Social History   Social History  . Marital status: Single    Spouse name: N/A  . Number of children: N/A  . Years of education: N/A   Occupational History  . Not on file.   Social History Main Topics  . Smoking status: Former Smoker    Types: Cigars  . Smokeless tobacco: Never Used  . Alcohol use Yes     Comment: Occasionally  . Drug use: No  . Sexual activity: Yes    Birth control/ protection: Condom   Other Topics Concern  . Not on file   Social History Narrative  . No narrative on file    Family History  Problem Relation Age of Onset  . Diabetes Mother   . Cancer Maternal Grandmother   . Diabetes Maternal Grandmother   . Cancer Maternal Grandfather     The following portions of the patient's history were reviewed and updated as appropriate: allergies, current medications, past family  history, past medical history, past social history, past surgical history and problem list.  Review of Systems Pertinent items noted in HPI and remainder of comprehensive ROS otherwise negative.    Objective:  BP 101/66   Pulse 93   Temp 98.3 F (36.8 C) (Oral)   Ht 5\' 8"  (1.727 m)   Wt 156 lb 4.8 oz (70.9 kg)   LMP 09/03/2015 (Approximate)   BMI 23.77 kg/m  CONSTITUTIONAL: Well-developed, well-nourished female in no acute distress.  HENT:  Normocephalic, atraumatic, External right and left ear normal. Oropharynx is clear and moist EYES: Conjunctivae and EOM are normal. Pupils are equal, round, and reactive to light. No scleral icterus.  NECK: Normal range of motion, supple, no masses.  Normal thyroid.  SKIN: Skin is warm and dry. No rash noted. Not diaphoretic. No erythema. No pallor. NEUROLOGIC: Alert and oriented to person, place, and time. Normal reflexes, muscle tone coordination. No cranial nerve deficit noted. PSYCHIATRIC: Normal mood and affect. Normal behavior. Normal judgment and thought content. CARDIOVASCULAR: Normal heart rate noted, regular rhythm RESPIRATORY: Clear to auscultation bilaterally. Effort and breath sounds normal, no problems with respiration noted. BREASTS: Symmetric in size. No masses, skin changes, nipple drainage, or lymphadenopathy. ABDOMEN: Soft, normal  bowel sounds, no distention noted.  No tenderness, rebound or guarding.  PELVIC: Normal appearing external genitalia; normal appearing vaginal mucosa and cervix.  No abnormal discharge noted.  Pap smear obtained.  Normal uterine size, no other palpable masses, no uterine or adnexal tenderness. MUSCULOSKELETAL: Normal range of motion. No tenderness.  No cyanosis, clubbing, or edema.  2+ distal pulses.   Assessment:  Annual gynecologic examination with pap smear Desires STI screen Desires OCP for contraception HPV vaccine counseling   Plan:  Will follow up results of pap smear and STI screen and  manage accordingly. Sprintec prescribed, emphasized taking at same time everyday. Condoms emphasized for STI prevention.  Patient was counseled about LARCs, she declined. Also counseled about HPV vaccine, she will decide soon. Routine preventative health maintenance measures emphasized. Please refer to After Visit Summary for other counseling recommendations.     Jaynie Collins, MD, FACOG Attending Obstetrician & Gynecologist, [facility] and Center for [facility]

## 2015-10-17 LAB — HIV ANTIBODY (ROUTINE TESTING W REFLEX): HIV SCREEN 4TH GENERATION: NONREACTIVE

## 2015-10-17 LAB — HEPATITIS B SURFACE ANTIGEN: HEP B S AG: NEGATIVE

## 2015-10-17 LAB — HEPATITIS C ANTIBODY: Hep C Virus Ab: <0.1 s/co ratio (ref 0.0–0.9)

## 2015-10-17 LAB — RPR: RPR Ser Ql: NONREACTIVE

## 2015-10-19 LAB — CHLAMYDIA/GONOCOCCUS/TRICHOMONAS, NAA
CHLAMYDIA BY NAA: NEGATIVE
Gonococcus by NAA: NEGATIVE
Trich vag by NAA: NEGATIVE

## 2015-10-23 ENCOUNTER — Encounter: Payer: Self-pay | Admitting: Obstetrics & Gynecology

## 2015-10-23 DIAGNOSIS — IMO0002 Reserved for concepts with insufficient information to code with codable children: Secondary | ICD-10-CM | POA: Insufficient documentation

## 2015-10-23 LAB — PAP IG W/ RFLX HPV ASCU: PAP SMEAR COMMENT: 0

## 2015-10-23 LAB — HPV DNA PROBE HIGH RISK, AMPLIFIED: HPV, high-risk: NEGATIVE

## 2015-12-05 ENCOUNTER — Encounter (HOSPITAL_COMMUNITY): Payer: Self-pay

## 2015-12-05 ENCOUNTER — Emergency Department (HOSPITAL_COMMUNITY)
Admission: EM | Admit: 2015-12-05 | Discharge: 2015-12-05 | Disposition: A | Discharge status: Home / Self Care | Payer: 59 | Attending: Emergency Medicine | Admitting: Emergency Medicine

## 2015-12-05 DIAGNOSIS — Z7982 Long term (current) use of aspirin: Secondary | ICD-10-CM | POA: Diagnosis not present

## 2015-12-05 DIAGNOSIS — R05 Cough: Secondary | ICD-10-CM | POA: Diagnosis present

## 2015-12-05 DIAGNOSIS — J069 Acute upper respiratory infection, unspecified: Secondary | ICD-10-CM | POA: Diagnosis not present

## 2015-12-05 DIAGNOSIS — B9789 Other viral agents as the cause of diseases classified elsewhere: Secondary | ICD-10-CM

## 2015-12-05 DIAGNOSIS — Z87891 Personal history of nicotine dependence: Secondary | ICD-10-CM | POA: Insufficient documentation

## 2015-12-05 LAB — RAPID STREP SCREEN (MED CTR MEBANE ONLY): Streptococcus, Group A Screen (Direct): NEGATIVE

## 2015-12-05 MED ORDER — ONDANSETRON 4 MG PO TBDP
8.0000 mg | ORAL_TABLET | Freq: Once | ORAL | Status: AC
  Administered 2015-12-05: 8 mg via ORAL
  Filled 2015-12-05: qty 2

## 2015-12-05 MED ORDER — IBUPROFEN 200 MG PO TABS
600.0000 mg | ORAL_TABLET | Freq: Once | ORAL | Status: AC
  Administered 2015-12-05: 600 mg via ORAL
  Filled 2015-12-05: qty 3

## 2015-12-05 NOTE — ED Provider Notes (Signed)
[facility]-EMERGENCY DEPT Provider Note   CSN: [id number] Arrival date & time: 12/05/15  0805     History   Chief Complaint Chief Complaint  Patient presents with  . Sore Throat    HPI Kelsey IANNACCONE is a 25 y.o. female.  HPI Kelsey Jones is a 25 y.o. female who presents to ED with complaint of a sore throat. Pt states she developed symptoms yesterday. Reports sore throat, headache, nasal congestion, cough. Pt reports taking tylenol and exedrin for her symptoms. Denies checking her temperature. States has had chills all day. Reports nausea and vomiting yesterday. Cough is non productive. Boyfriend with same symptoms. No other complaints.   Past Medical History:  Diagnosis Date  . Medical history non-contributory     Patient Active Problem List   Diagnosis Date Noted  . ASCUS pap smear but negative high-risk HPV on 10/16/2015.    10/23/2015    Past Surgical History:  Procedure Laterality Date  . INDUCED ABORTION      OB History    Gravida Para Term Preterm AB Living   2 1 1   1 1    SAB TAB Ectopic Multiple Live Births     1             Home Medications    Prior to Admission medications   Medication Sig Start Date End Date Taking? Authorizing Provider  acetaminophen (TYLENOL) 325 MG tablet Take 650 mg by mouth every 6 (six) hours as needed for headache.   Yes Historical Provider, MD  aspirin-acetaminophen-caffeine (EXCEDRIN MIGRAINE) [phone] MG tablet Take 1 tablet by mouth every 6 (six) hours as needed for headache.   Yes Historical Provider, MD  norgestimate-ethinyl estradiol (ORTHO-CYCLEN,SPRINTEC,PREVIFEM) 0.25-35 MG-MCG tablet Take 1 tablet by mouth daily. Patient not taking: Reported on 12/05/2015 10/16/15   Tereso Newcomer, MD    Family History Family History  Problem Relation Age of Onset  . Diabetes Mother   . Cancer Maternal Grandmother   . Diabetes Maternal Grandmother   . Cancer Maternal Grandfather     Social History Social History    Substance Use Topics  . Smoking status: Former Smoker    Types: Cigars  . Smokeless tobacco: Never Used  . Alcohol use No     Comment: Occasionally     Allergies   Review of patient's allergies indicates no known allergies.   Review of Systems Review of Systems  Constitutional: Negative for chills and fever.  HENT: Positive for congestion, ear pain and sore throat.   Respiratory: Positive for cough. Negative for chest tightness and shortness of breath.   Cardiovascular: Negative for chest pain, palpitations and leg swelling.  Gastrointestinal: Positive for nausea and vomiting. Negative for abdominal pain and diarrhea.  Genitourinary: Negative for dysuria, flank pain, pelvic pain, vaginal bleeding, vaginal discharge and vaginal pain.  Musculoskeletal: Negative for arthralgias, myalgias, neck pain and neck stiffness.  Skin: Negative for rash.  Neurological: Negative for dizziness, weakness and headaches.  All other systems reviewed and are negative.    Physical Exam Updated Vital Signs BP 93/63 (BP Location: Right Arm)   Pulse 93   Temp 98.9 F (37.2 C) (Oral)   Resp 18   Ht 5\' 7"  (1.702 m)   Wt 70.3 kg   LMP 11/05/2015 (Approximate)   SpO2 100%   BMI 24.28 kg/m   Physical Exam  Constitutional: She appears well-developed and well-nourished. No distress.  HENT:  Head: Normocephalic.  Right Ear: Tympanic membrane, external ear  and ear canal normal.  Left Ear: Tympanic membrane, external ear and ear canal normal.  Nose: Mucosal edema and rhinorrhea present.  Mouth/Throat: Uvula is midline and mucous membranes are normal. Posterior oropharyngeal erythema present. No oropharyngeal exudate, posterior oropharyngeal edema or tonsillar abscesses.  Eyes: Conjunctivae are normal.  Neck: Neck supple.  Cardiovascular: Normal rate, regular rhythm and normal heart sounds.   Pulmonary/Chest: Effort normal and breath sounds normal. No respiratory distress. She has no wheezes. She  has no rales.  Abdominal: Soft. Bowel sounds are normal. She exhibits no distension. There is no tenderness. There is no rebound.  Musculoskeletal: She exhibits no edema.  Neurological: She is alert.  Skin: Skin is warm and dry.  Psychiatric: She has a normal mood and affect. Her behavior is normal.  Nursing note and vitals reviewed.    ED Treatments / Results  Labs (all labs ordered are listed, but only abnormal results are displayed) Labs Reviewed  [facility]EN [facility])  CULTURE, GROUP A STREP [place]([facility])    EKG  EKG Interpretation None       Radiology No results found.  Procedures Procedures (including critical care time)  Medications Ordered in ED Medications - No data to display   Initial Impression / Assessment and Plan / ED Course  I have reviewed the triage vital signs and the nursing notes.  Pertinent labs & imaging results that were available during my care of the patient were reviewed by me and considered in my medical decision making (see chart for details).  Clinical Course    Patient in emergency department with cough, congestion, sore throat since yesterday. Rapid strep is negative. Patient is afebrile. Normal vital signs. Lungs are clear to auscultation. Symptoms most consistent with viral upper respiratory tract infection. She is not vomiting in the emergency department. Treated with ibuprofen and Zofran. Home with symptomatic treatment. Return precautions discussed.  Final Clinical Impressions(s) / ED Diagnoses   Final diagnoses:  Viral URI with cough    New Prescriptions New Prescriptions   No medications on file     Jaynie Crumbleatyana Jazlene Bares, PA-C 12/05/15 1250    Nira ConnPedro Eduardo Cardama, MD 12/06/15 1118

## 2015-12-05 NOTE — ED Notes (Signed)
Patient given a warm blanket; placed on continuous pulse oximetry and blood pressure cuff; visitor at bedside

## 2015-12-05 NOTE — ED Triage Notes (Addendum)
Pt c/o sore throat today. Reports vomiting several times yesterday. Right tonsil appears swollen and red. Also c/o left ear pain.

## 2015-12-05 NOTE — Discharge Instructions (Signed)
Ibuprofen and tylenol for pain, body aches. Sudafed for congestion. Salt water gargles several times a day. Follow up with family doctor if not improving.

## 2015-12-06 LAB — CULTURE, GROUP A STREP (THRC)

## 2015-12-13 ENCOUNTER — Encounter: Payer: Self-pay | Admitting: Obstetrics and Gynecology

## 2015-12-13 ENCOUNTER — Ambulatory Visit (INDEPENDENT_AMBULATORY_CARE_PROVIDER_SITE_OTHER): Payer: 59 | Admitting: Obstetrics and Gynecology

## 2015-12-13 VITALS — BP 105/71 | HR 60 | Temp 98.4°F | Ht 67.0 in | Wt 157.3 lb

## 2015-12-13 DIAGNOSIS — Z3491 Encounter for supervision of normal pregnancy, unspecified, first trimester: Secondary | ICD-10-CM

## 2015-12-13 DIAGNOSIS — N760 Acute vaginitis: Secondary | ICD-10-CM | POA: Diagnosis not present

## 2015-12-13 DIAGNOSIS — N912 Amenorrhea, unspecified: Secondary | ICD-10-CM

## 2015-12-13 DIAGNOSIS — Z3201 Encounter for pregnancy test, result positive: Secondary | ICD-10-CM | POA: Diagnosis not present

## 2015-12-13 LAB — POCT URINE PREGNANCY
PREG TEST UR: POSITIVE — AB
PREG TEST UR: POSITIVE — AB

## 2015-12-13 NOTE — Progress Notes (Signed)
25 yo G2P1011 presenting today for the evaluation of amenorrhea and excessive discharge without pruritis or odor. She denies any abdominal/pelvic pain. She denies any vaginal bleeding. Patient never started her birth control prescription as she was not aware that it was electronically prescribed. Patient is otherwise without complaints  Past Medical History:  Diagnosis Date  . Medical history non-contributory    Past Surgical History:  Procedure Laterality Date  . INDUCED ABORTION     Family History  Problem Relation Age of Onset  . Diabetes Mother   . Cancer Maternal Grandmother   . Diabetes Maternal Grandmother   . Cancer Maternal Grandfather    Social History  Substance Use Topics  . Smoking status: Former Smoker    Types: Cigars  . Smokeless tobacco: Never Used  . Alcohol use No     Comment: Occasionally   ROS See pertinent in HPI  Blood pressure 105/71, pulse 60, temperature 98.4 F (36.9 C), temperature source Oral, height 5\' 7"  (1.702 m), weight 157 lb 4.8 oz (71.4 kg), last menstrual period 11/04/2015. GENERAL: Well-developed, well-nourished female in no acute distress.  ABDOMEN: Soft, nontender, nondistended. No organomegaly. PELVIC: Normal external female genitalia. Vagina is pink and rugated.  Normal discharge. Normal appearing cervix. Uterus is normal in size. No adnexal mass or tenderness. EXTREMITIES: No cyanosis, clubbing, or edema, 2+ distal pulses.  UPT positive  A/P 25 yo with incidental pregnancy finding - Wet prep collected - patient advised to start prenatal vitamins - first trimester precautions reviewed  - RTC to start prenatal care

## 2015-12-18 LAB — NUSWAB VG+, CANDIDA 6SP
CANDIDA LUSITANIAE, NAA: NEGATIVE
CANDIDA PARAPSILOSIS, NAA: NEGATIVE
CANDIDA TROPICALIS, NAA: NEGATIVE
CHLAMYDIA TRACHOMATIS, NAA: NEGATIVE
Candida albicans, NAA: NEGATIVE
Candida glabrata, NAA: NEGATIVE
Candida krusei, NAA: NEGATIVE
Neisseria gonorrhoeae, NAA: NEGATIVE
TRICH VAG BY NAA: NEGATIVE

## 2015-12-24 ENCOUNTER — Inpatient Hospital Stay (HOSPITAL_COMMUNITY)
Admission: AD | Admit: 2015-12-24 | Discharge: 2015-12-24 | Disposition: A | Discharge status: Home / Self Care | Payer: 59 | Source: Ambulatory Visit | Attending: Obstetrics and Gynecology | Admitting: Obstetrics and Gynecology

## 2015-12-24 ENCOUNTER — Inpatient Hospital Stay (HOSPITAL_COMMUNITY): Payer: 59

## 2015-12-24 ENCOUNTER — Encounter: Payer: Self-pay | Admitting: Student

## 2015-12-24 DIAGNOSIS — O209 Hemorrhage in early pregnancy, unspecified: Secondary | ICD-10-CM | POA: Diagnosis present

## 2015-12-24 DIAGNOSIS — O219 Vomiting of pregnancy, unspecified: Secondary | ICD-10-CM

## 2015-12-24 DIAGNOSIS — O468X1 Other antepartum hemorrhage, first trimester: Secondary | ICD-10-CM

## 2015-12-24 DIAGNOSIS — R110 Nausea: Secondary | ICD-10-CM

## 2015-12-24 DIAGNOSIS — Z87891 Personal history of nicotine dependence: Secondary | ICD-10-CM | POA: Insufficient documentation

## 2015-12-24 DIAGNOSIS — Z3491 Encounter for supervision of normal pregnancy, unspecified, first trimester: Secondary | ICD-10-CM

## 2015-12-24 DIAGNOSIS — O26899 Other specified pregnancy related conditions, unspecified trimester: Secondary | ICD-10-CM

## 2015-12-24 DIAGNOSIS — Z3A01 Less than 8 weeks gestation of pregnancy: Secondary | ICD-10-CM | POA: Diagnosis not present

## 2015-12-24 DIAGNOSIS — O418X10 Other specified disorders of amniotic fluid and membranes, first trimester, not applicable or unspecified: Secondary | ICD-10-CM

## 2015-12-24 LAB — URINALYSIS, ROUTINE W REFLEX MICROSCOPIC
BILIRUBIN URINE: NEGATIVE
GLUCOSE, UA: NEGATIVE mg/dL
KETONES UR: 15 mg/dL — AB
Leukocytes, UA: NEGATIVE
Nitrite: NEGATIVE
PROTEIN: NEGATIVE mg/dL
Specific Gravity, Urine: >1.03 — ABNORMAL HIGH (ref 1.005–1.030)
pH: 6 (ref 5.0–8.0)

## 2015-12-24 LAB — HCG, QUANTITATIVE, PREGNANCY: HCG, BETA CHAIN, QUANT, S: 147823 m[IU]/mL — AB (ref <5)

## 2015-12-24 LAB — URINE MICROSCOPIC-ADD ON

## 2015-12-24 LAB — CBC
HCT: 33.4 % — ABNORMAL LOW (ref 36.0–46.0)
HEMOGLOBIN: 11.3 g/dL — AB (ref 12.0–15.0)
MCH: 26.3 pg (ref 26.0–34.0)
MCHC: 33.8 g/dL (ref 30.0–36.0)
MCV: 77.9 fL — ABNORMAL LOW (ref 78.0–100.0)
PLATELETS: 217 10*3/uL (ref 150–400)
RBC: 4.29 MIL/uL (ref 3.87–5.11)
RDW: 14 % (ref 11.5–15.5)
WBC: 8.9 10*3/uL (ref 4.0–10.5)

## 2015-12-24 MED ORDER — PROMETHAZINE HCL 12.5 MG PO TABS: 12.5000 mg | ORAL_TABLET | Freq: Four times a day (QID) | ORAL | 0 refills | Status: DC | PRN

## 2015-12-24 NOTE — Discharge Instructions (Signed)
Subchorionic Hematoma °A subchorionic hematoma is a gathering of blood between the outer wall of the placenta and the inner wall of the womb (uterus). The placenta is the organ that connects the fetus to the wall of the uterus. The placenta performs the feeding, breathing (oxygen to the fetus), and waste removal (excretory work) of the fetus.  °Subchorionic hematoma is the most common abnormality found on a result from ultrasonography done during the first trimester or early second trimester of pregnancy. If there has been little or no vaginal bleeding, early small hematomas usually shrink on their own and do not affect your baby or pregnancy. The blood is gradually absorbed over 1-2 weeks. When bleeding starts later in pregnancy or the hematoma is larger or occurs in an older pregnant woman, the outcome may not be as good. Larger hematomas may get bigger, which increases the chances for miscarriage. Subchorionic hematoma also increases the risk of premature detachment of the placenta from the uterus, preterm (premature) labor, and stillbirth. °HOME CARE INSTRUCTIONS °· Stay on bed rest if your health care provider recommends this. Although bed rest will not prevent more bleeding or prevent a miscarriage, your health care provider may recommend bed rest until you are advised otherwise. °· Avoid heavy lifting (more than 10 lb [4.5 kg]), exercise, sexual intercourse, or douching as directed by your health care provider. °· Keep track of the number of pads you use each day and how soaked (saturated) they are. Write down this information. °· Do not use tampons. °· Keep all follow-up appointments as directed by your health care provider. Your health care provider may ask you to have follow-up blood tests or ultrasound tests or both. °SEEK IMMEDIATE MEDICAL CARE IF: °· You have severe cramps in your stomach, back, abdomen, or pelvis. °· You have a fever. °· You pass large clots or tissue. Save any tissue for your health  care provider to look at. °· Your bleeding increases or you become lightheaded, feel weak, or have fainting episodes. °  °This information is not intended to replace advice given to you by your health care provider. Make sure you discuss any questions you have with your health care provider. °  °Document Released: 06/18/2006 Document Revised: 03/24/2014 Document Reviewed: 09/30/2012 °Elsevier Interactive Patient Education ©2016 [employer] ° °

## 2015-12-24 NOTE — MAU Note (Signed)
Pt started having vaginal bleeding at 1645.  Had a brown spot on panties about golf ball size.  Still has some on toilet paper, brownish.  Cramping started this am and continues.

## 2015-12-24 NOTE — MAU Provider Note (Signed)
History     CSN: [id number]  Arrival date and time: 12/24/15 1649   First Provider Initiated Contact with Patient 12/24/15 1846      Chief Complaint  Patient presents with  . Vaginal Bleeding   HPI Kelsey Jones is a 25 y.o. G3P1011 at 6037w2d by LMP who presents with vaginal bleeding. Reports brown spotting on toilet paper and underwear since this morning. Has not had to wear a pad. Denies abdominal pain, diarrhea, constipation, fever, or recent intercourse. Some nausea & vomiting, last vomited this morning.   OB History    Gravida Para Term Preterm AB Living   3 1 1   1 1    SAB TAB Ectopic Multiple Live Births     0            Past Medical History:  Diagnosis Date  . Medical history non-contributory     Past Surgical History:  Procedure Laterality Date  . INDUCED ABORTION      Family History  Problem Relation Age of Onset  . Diabetes Mother   . Cancer Maternal Grandmother   . Diabetes Maternal Grandmother   . Cancer Maternal Grandfather     Social History  Substance Use Topics  . Smoking status: Former Smoker    Types: Cigars  . Smokeless tobacco: Never Used  . Alcohol use No     Comment: Occasionally    Allergies: No Known Allergies  No prescriptions prior to admission.    Review of Systems  Constitutional: Negative.   Gastrointestinal: Positive for nausea and vomiting. Negative for abdominal pain, constipation and diarrhea.  Genitourinary: Negative for dysuria.       + vaginal bleeding   Physical Exam   Blood pressure 120/67, pulse 85, temperature 98.6 F (37 C), temperature source Oral, resp. rate 18, last menstrual period 11/04/2015, SpO2 100 %.  Physical Exam  Nursing note and vitals reviewed. Constitutional: She is oriented to person, place, and time. She appears well-developed and well-nourished. No distress.  HENT:  Head: Normocephalic and atraumatic.  Eyes: Conjunctivae are normal. Right eye exhibits no discharge. Left eye exhibits  no discharge. No scleral icterus.  Neck: Normal range of motion.  Cardiovascular: Normal rate, regular rhythm and normal heart sounds.   No murmur heard. Respiratory: Effort normal and breath sounds normal. No respiratory distress. She has no wheezes.  GI: Soft. Bowel sounds are normal. She exhibits no distension. There is no tenderness.  Genitourinary:  Genitourinary Comments: Small amount of dried brown blood in underwear, no active bleeding  Neurological: She is alert and oriented to person, place, and time.  Skin: Skin is warm and dry. She is not diaphoretic.  Psychiatric: She has a normal mood and affect. Her behavior is normal. Judgment and thought content normal.    MAU Course  Procedures Results for orders placed or performed during the hospital encounter of 12/24/15 (from the past 24 hour(s))  Urinalysis, Routine w reflex microscopic (not at [facility])     Status: Abnormal   Collection Time: 12/24/15  5:10 PM  Result Value Ref Range   Color, Urine YELLOW YELLOW   APPearance HAZY (A) CLEAR   Specific Gravity, Urine >1.030 (H) 1.005 - 1.030   pH 6.0 5.0 - 8.0   Glucose, UA NEGATIVE NEGATIVE mg/dL   Hgb urine dipstick MODERATE (A) NEGATIVE   Bilirubin Urine NEGATIVE NEGATIVE   Ketones, ur 15 (A) NEGATIVE mg/dL   Protein, ur NEGATIVE NEGATIVE mg/dL   Nitrite NEGATIVE NEGATIVE  Leukocytes, UA NEGATIVE NEGATIVE  Urine microscopic-add on     Status: Abnormal   Collection Time: 12/24/15  5:10 PM  Result Value Ref Range   Squamous Epithelial / LPF 0-5 (A) NONE SEEN   WBC, UA 0-5 0 - 5 WBC/hpf   RBC / HPF 0-5 0 - 5 RBC/hpf   Bacteria, UA FEW (A) NONE SEEN   Urine-Other MUCOUS PRESENT   CBC     Status: Abnormal   Collection Time: 12/24/15  5:14 PM  Result Value Ref Range   WBC 8.9 4.0 - 10.5 K/uL   RBC 4.29 3.87 - 5.11 MIL/uL   Hemoglobin 11.3 (L) 12.0 - 15.0 g/dL   HCT 16.1 (L) 09.6 - 04.5 %   MCV 77.9 (L) 78.0 - 100.0 fL   MCH 26.3 26.0 - 34.0 pg   MCHC 33.8 30.0 - 36.0  g/dL   RDW 40.9 81.1 - 91.4 %   Platelets 217 150 - 400 K/uL  hCG, quantitative, pregnancy     Status: Abnormal   Collection Time: 12/24/15  5:14 PM  Result Value Ref Range   hCG, Beta Chain, Quant, S 147,823 (H) <5 mIU/mL   US Ob Comp Less 14 Wks  Result Date: 12/24/2015 CLINICAL DATA:  Vaginal bleeding and cramping.  Beta HCG of 147,000. EXAM: OBSTETRIC <14 WK [place] AND TRANSVAGINAL OB US TECHNIQUE: Both transabdominal and transvaginal ultrasound examinations were performed for complete evaluation of the gestation as well as the maternal uterus, adnexal regions, and pelvic cul-de-sac. Transvaginal technique was performed to assess early pregnancy. COMPARISON:  02/14/2010 FINDINGS: Intrauterine gestational sac: Present Yolk sac:  Present Embryo:  Present Cardiac Activity: Present Heart Rate: 132  bpm CRL:  11.9  Mm   7 w   2 d                  [place] EDC: 08/09/2016 Subchorionic hemorrhage:  Small volume.  Primarily right-sided. Maternal uterus/adnexae: Left ovarian corpus luteal cyst. Normal right ovary. Trace free pelvic fluid is likely physiologic. IMPRESSION: 1. Intrauterine gestational of approximately 7 weeks 2 days with fetal heart rate of 132 beats per minute. 2. Small subchronic hemorrhage. 3. Left ovarian corpus luteal cyst. Electronically Signed   By: Jeronimo Greaves M.D.   On: 12/24/2015 18:52   US Ob Transvaginal  Result Date: 12/24/2015 CLINICAL DATA:  Vaginal bleeding and cramping.  Beta HCG of 147,000. EXAM: OBSTETRIC <14 WK [place] AND TRANSVAGINAL OB US TECHNIQUE: Both transabdominal and transvaginal ultrasound examinations were performed for complete evaluation of the gestation as well as the maternal uterus, adnexal regions, and pelvic cul-de-sac. Transvaginal technique was performed to assess early pregnancy. COMPARISON:  02/14/2010 FINDINGS: Intrauterine gestational sac: Present Yolk sac:  Present Embryo:  Present Cardiac Activity: Present Heart Rate: 132  bpm CRL:  11.9  Mm   7 w   2 d                   [place] EDC: 08/09/2016 Subchorionic hemorrhage:  Small volume.  Primarily right-sided. Maternal uterus/adnexae: Left ovarian corpus luteal cyst. Normal right ovary. Trace free pelvic fluid is likely physiologic. IMPRESSION: 1. Intrauterine gestational of approximately 7 weeks 2 days with fetal heart rate of 132 beats per minute. 2. Small subchronic hemorrhage. 3. Left ovarian corpus luteal cyst. Electronically Signed   By: Jeronimo Greaves M.D.   On: 12/24/2015 18:52    MDM O positive Ultrasound shows SIUP with cardiac activity and small [facility]  Assessment and Plan  A: 1. Normal IUP (intrauterine pregnancy) on prenatal ultrasound, first trimester   2. Vaginal bleeding in pregnancy, first trimester   3. Subchorionic hematoma in first trimester, single or unspecified fetus   4. Pregnancy related nausea, antepartum    P: Discharge home Rx phenergan Pelvic rest Discussed reasons to return to MAU Keep f/u with OB  Judeth Horn 12/24/2015, 6:46 PM

## 2016-01-09 ENCOUNTER — Ambulatory Visit (INDEPENDENT_AMBULATORY_CARE_PROVIDER_SITE_OTHER): Payer: 59 | Admitting: Certified Nurse Midwife

## 2016-01-09 ENCOUNTER — Encounter: Payer: Self-pay | Admitting: Certified Nurse Midwife

## 2016-01-09 VITALS — BP 108/78 | HR 94 | Wt 154.0 lb

## 2016-01-09 DIAGNOSIS — O09899 Supervision of other high risk pregnancies, unspecified trimester: Secondary | ICD-10-CM | POA: Insufficient documentation

## 2016-01-09 DIAGNOSIS — Z3490 Encounter for supervision of normal pregnancy, unspecified, unspecified trimester: Secondary | ICD-10-CM

## 2016-01-09 DIAGNOSIS — O0990 Supervision of high risk pregnancy, unspecified, unspecified trimester: Secondary | ICD-10-CM | POA: Insufficient documentation

## 2016-01-09 DIAGNOSIS — O26851 Spotting complicating pregnancy, first trimester: Secondary | ICD-10-CM

## 2016-01-09 MED ORDER — CITRANATAL BLOOM 90-1 MG PO TABS: 1.0000 | ORAL_TABLET | Freq: Every day | ORAL | 12 refills | Status: DC

## 2016-01-09 NOTE — Progress Notes (Signed)
Pt states that she is still having some spotting. Pt was seen in MAU on 12/24/15 and [facility]. Pt states that she is having some lower pelvic pains, sharp.

## 2016-01-09 NOTE — Addendum Note (Signed)
Addended by: Marya LandryFOSTER, Candler Ginsberg D on: 01/09/2016 05:00 PM   Modules accepted: Orders

## 2016-01-09 NOTE — Progress Notes (Signed)
Subjective:    Kelsey Jones is being seen today for her first obstetrical visit.  This is a planned pregnancy. She is at [redacted]w[redacted]d gestation. Her obstetrical history is significant for none. Relationship with FOB: significant other, living together. Patient does intend to breast feed. Pregnancy history fully reviewed.  The information documented in the HPI was reviewed and verified.  Menstrual History: OB History    Gravida Para Term Preterm AB Living   3 1 1   1 1    SAB TAB Ectopic Multiple Live Births     0     1     Patient's last menstrual period was 11/04/2015 (approximate).    Past Medical History:  Diagnosis Date  . Medical history non-contributory     Past Surgical History:  Procedure Laterality Date  . INDUCED ABORTION       (Not in a hospital admission) No Known Allergies  Social History  Substance Use Topics  . Smoking status: Former Smoker    Types: Cigars  . Smokeless tobacco: Never Used  . Alcohol use No     Comment: Occasionally    Family History  Problem Relation Age of Onset  . Diabetes Mother   . Cancer Maternal Grandmother   . Diabetes Maternal Grandmother   . Cancer Maternal Grandfather      Review of Systems Constitutional: negative for weight loss Gastrointestinal: negative for vomiting Genitourinary:negative for genital lesions and vaginal discharge and dysuria, + light spotting Musculoskeletal:negative for back pain Behavioral/Psych: negative for abusive relationship, depression, illegal drug usage and tobacco use    Objective:    BP 108/78   Pulse 94   Wt 154 lb (69.9 kg)   LMP 11/04/2015 (Approximate)   BMI 24.12 kg/m  General Appearance:    Alert, cooperative, no distress, appears stated age  Head:    Normocephalic, without obvious abnormality, atraumatic  Eyes:    PERRL, conjunctiva/corneas clear, EOM's intact, fundi    benign, both eyes  Ears:    Normal TM's and external ear canals, both ears  Nose:   Nares normal, septum  midline, mucosa normal, no drainage    or sinus tenderness  Throat:   Lips, mucosa, and tongue normal; teeth and gums normal  Neck:   Supple, symmetrical, trachea midline, no adenopathy;    thyroid:  no enlargement/tenderness/nodules; no carotid   bruit or JVD  Back:     Symmetric, no curvature, ROM normal, no CVA tenderness  Lungs:     Clear to auscultation bilaterally, respirations unlabored  Chest Wall:    No tenderness or deformity   Heart:    Regular rate and rhythm, S1 and S2 normal, no murmur, rub   or gallop  Breast Exam:    No tenderness, masses, or nipple abnormality  Abdomen:     Soft, non-tender, bowel sounds active all four quadrants,    no masses, no organomegaly  Genitalia:    Normal female without lesion, discharge or tenderness  Extremities:   Extremities normal, atraumatic, no cyanosis or edema  Pulses:   2+ and symmetric all extremities  Skin:   Skin color, texture, turgor normal, no rashes or lesions  Lymph nodes:   Cervical, supraclavicular, and axillary nodes normal  Neurologic:   CNII-XII intact, normal strength, sensation and reflexes    throughout     Cervix:  shortened, thick, closed and posterior.     Lab Review Urine pregnancy test Labs reviewed yes Radiologic studies reviewed yes Assessment:  Pregnancy at 4275w3d weeks   Hx of subchorionic hemorrhage this pregnancy  Shortened cervix hx last pregnancy  Vaginal bleeding this pregnancy  Plan:    MFM consult/US hx of cervical shortening last pregnancy with vaginal progesterone Pap done: 10/2015: ASCUS Prenatal vitamins.  Counseling provided regarding continued use of seat belts, cessation of alcohol consumption, smoking or use of illicit drugs; infection precautions i.e., influenza/TDAP immunizations, toxoplasmosis,CMV, parvovirus, listeria and varicella; workplace safety, exercise during pregnancy; routine dental care, safe medications, sexual activity, hot tubs, saunas, pools, travel, caffeine use, fish  and methlymercury, potential toxins, hair treatments, varicose veins Weight gain recommendations per IOM guidelines reviewed: underweight/BMI< 18.5--> gain 28 - 40 lbs; normal weight/BMI 18.5 - 24.9--> gain 25 - 35 lbs; overweight/BMI 25 - 29.9--> gain 15 - 25 lbs; obese/BMI >30->gain  11 - 20 lbs Problem list reviewed and updated. FIRST/CF mutation testing/NIPT/QUAD SCREEN/fragile X/Ashkenazi Jewish population testing/Spinal muscular atrophy discussed: ordered. Role of ultrasound in pregnancy discussed; fetal survey: requested. Amniocentesis discussed: not indicated. VBAC calculator score: VBAC consent form provided Meds ordered this encounter  Medications  . Prenatal-DSS-FeCb-FeGl-FA (CITRANATAL BLOOM) 90-1 MG TABS    Sig: Take 1 tablet by mouth daily.    Dispense:  30 tablet    Refill:  12   Orders Placed This Encounter  Procedures  . Culture, OB Urine  . US MFM OB Transvaginal    Standing Status:   Future    Standing Expiration Date:   03/10/2017    Order Specific Question:   Reason for Exam (SYMPTOM  OR DIAGNOSIS REQUIRED)    Answer:   cervical shortening    Order Specific Question:   Preferred imaging location?    Answer:   [facility]-Ultrasound  . US MFM Fetal Nuchal Translucency    Standing Status:   Future    Standing Expiration Date:   03/10/2017    Order Specific Question:   Reason for Exam (SYMPTOM  OR DIAGNOSIS REQUIRED)    Answer:   hx of cervical shortnening    Order Specific Question:   Preferred imaging location?    Answer:   [facility]-Ultrasound  . Prenatal Profile I  . Hemoglobinopathy evaluation  . Varicella zoster antibody, IgG  . VITAMIN D 25 Hydroxy (Vit-D Deficiency, Fractures)  . HIV antibody  . MaterniT21 PLUS Core+SCA    Order Specific Question:   Is the patient insulin dependent?    Answer:   No    Order Specific Question:   Please enter gestational age. This should be expressed as weeks AND days, i.e. 16w 6d. Enter weeks here. Enter days in next question.     Answer:   779    Order Specific Question:   Please enter gestational age. This should be expressed as weeks AND days, i.e. 16w 6d. Enter days here. Enter weeks in previous question.    Answer:   3    Order Specific Question:   How was gestational age calculated?    Answer:   Ultrasound    Order Specific Question:   Please give the date of LMP OR Ultrasound OR Estimated date of delivery.    Answer:   08/10/2016    Order Specific Question:   Number of Fetuses (Type of Pregnancy):    Answer:   1    Order Specific Question:   Indications for performing the test? (please choose all that apply):    Answer:   Routine screening    Order Specific Question:   Other Indications? (Y=Yes, N=No)  Answer:   N    Order Specific Question:   If this is a repeat specimen, please indicate the reason:    Answer:   Not indicated    Order Specific Question:   Please specify the patient's race: (C=White/Caucasion, B=Black, I=Native American, A=Asian, H=Hispanic, O=Other, U=Unknown)    Answer:   B    Order Specific Question:   Donor Egg - indicate if the egg was obtained from in vitro fertilization.    Answer:   N    Order Specific Question:   Age of Egg Donor.    Answer:   45    Order Specific Question:   Prior Down Syndrome/ONTD screening during current pregnancy.    Answer:   N    Order Specific Question:   Prior First Trimester Testing    Answer:   N    Order Specific Question:   Prior Second Trimester Testing    Answer:   N    Order Specific Question:   Family History of Neural Tube Defects    Answer:   N    Order Specific Question:   Prior Pregnancy with Down Syndrome    Answer:   N    Order Specific Question:   Please give the patient's weight (in pounds)    Answer:   154  . AMB referral to maternal fetal medicine    Referral Priority:   Routine    Referral Type:   Consultation    Referral Reason:   Specialty Services Required    Number of Visits Requested:   1    Follow up in 4 weeks. 50% of  30 min visit spent on counseling and coordination of care.

## 2016-01-11 LAB — VARICELLA ZOSTER ANTIBODY, IGG: VARICELLA: 181 {index} (ref >165)

## 2016-01-11 LAB — HEMOGLOBINOPATHY EVALUATION
HGB A: 97.5 % (ref 94.0–98.0)
HGB C: 0 %
HGB S: 0 %
Hemoglobin A2 Quantitation: 2.5 % (ref 0.7–3.1)
Hemoglobin F Quantitation: 0 % (ref 0.0–2.0)

## 2016-01-11 LAB — PRENATAL PROFILE I(LABCORP)
ANTIBODY SCREEN: NEGATIVE
BASOS ABS: 0 10*3/uL (ref 0.0–0.2)
Basos: 0 %
EOS (ABSOLUTE): 0.2 10*3/uL (ref 0.0–0.4)
Eos: 3 %
HEMOGLOBIN: 11 g/dL — AB (ref 11.1–15.9)
Hematocrit: 32.4 % — ABNORMAL LOW (ref 34.0–46.6)
Hepatitis B Surface Ag: NEGATIVE
IMMATURE GRANULOCYTES: 0 %
Immature Grans (Abs): 0 10*3/uL (ref 0.0–0.1)
LYMPHS ABS: 1.8 10*3/uL (ref 0.7–3.1)
Lymphs: 25 %
MCH: 26.4 pg — AB (ref 26.6–33.0)
MCHC: 34 g/dL (ref 31.5–35.7)
MCV: 78 fL — ABNORMAL LOW (ref 79–97)
MONOS ABS: 0.7 10*3/uL (ref 0.1–0.9)
Monocytes: 10 %
NEUTROS ABS: 4.3 10*3/uL (ref 1.4–7.0)
NEUTROS PCT: 62 %
PLATELETS: 222 10*3/uL (ref 150–379)
RBC: 4.17 x10E6/uL (ref 3.77–5.28)
RDW: 14.1 % (ref 12.3–15.4)
RPR Ser Ql: NONREACTIVE
Rh Factor: POSITIVE
Rubella Antibodies, IGG: 2.08 index (ref >0.99)
WBC: 7.1 10*3/uL (ref 3.4–10.8)

## 2016-01-11 LAB — VITAMIN D 25 HYDROXY (VIT D DEFICIENCY, FRACTURES): Vit D, 25-Hydroxy: 7.3 ng/mL — ABNORMAL LOW (ref 30.0–100.0)

## 2016-01-11 LAB — HIV ANTIBODY (ROUTINE TESTING W REFLEX): HIV SCREEN 4TH GENERATION: NONREACTIVE

## 2016-01-11 LAB — CULTURE, OB URINE

## 2016-01-11 LAB — URINE CULTURE, OB REFLEX

## 2016-01-12 LAB — NUSWAB VG+, CANDIDA 6SP
CANDIDA GLABRATA, NAA: NEGATIVE
CANDIDA KRUSEI, NAA: NEGATIVE
CANDIDA TROPICALIS, NAA: NEGATIVE
Candida albicans, NAA: NEGATIVE
Candida lusitaniae, NAA: NEGATIVE
Candida parapsilosis, NAA: NEGATIVE
Chlamydia trachomatis, NAA: NEGATIVE
NEISSERIA GONORRHOEAE, NAA: NEGATIVE
Trich vag by NAA: NEGATIVE

## 2016-01-14 ENCOUNTER — Other Ambulatory Visit: Payer: Self-pay | Admitting: Certified Nurse Midwife

## 2016-01-14 DIAGNOSIS — R7989 Other specified abnormal findings of blood chemistry: Secondary | ICD-10-CM | POA: Insufficient documentation

## 2016-01-14 MED ORDER — VITAMIN D (ERGOCALCIFEROL) 1.25 MG (50000 UNIT) PO CAPS: 50000.0000 [IU] | ORAL_CAPSULE | ORAL | 2 refills | Status: DC

## 2016-01-15 LAB — TOXASSURE SELECT 13 (MW), URINE

## 2016-01-17 ENCOUNTER — Encounter (HOSPITAL_COMMUNITY): Payer: Self-pay | Admitting: Certified Nurse Midwife

## 2016-01-17 LAB — MATERNIT21 PLUS CORE+SCA
CHROMOSOME 18: NEGATIVE
CHROMOSOME 21: NEGATIVE
Chromosome 13: NEGATIVE
PDF: 0
Y CHROMOSOME: NOT DETECTED

## 2016-01-21 ENCOUNTER — Other Ambulatory Visit: Payer: Self-pay | Admitting: Certified Nurse Midwife

## 2016-01-21 DIAGNOSIS — Z3480 Encounter for supervision of other normal pregnancy, unspecified trimester: Secondary | ICD-10-CM

## 2016-01-24 ENCOUNTER — Encounter: Payer: Self-pay | Admitting: Obstetrics & Gynecology

## 2016-01-24 ENCOUNTER — Telehealth: Payer: Self-pay

## 2016-01-24 NOTE — Telephone Encounter (Signed)
Contacted pt about lab results and rx sent. Left vm to call.

## 2016-01-25 ENCOUNTER — Telehealth: Payer: Self-pay | Admitting: *Deleted

## 2016-01-25 NOTE — Telephone Encounter (Signed)
Patient would like a generic prenatal- her insurance does not cover the Bloom and it is $85.

## 2016-01-29 ENCOUNTER — Other Ambulatory Visit: Payer: Self-pay | Admitting: Certified Nurse Midwife

## 2016-01-29 DIAGNOSIS — Z3481 Encounter for supervision of other normal pregnancy, first trimester: Secondary | ICD-10-CM

## 2016-01-29 MED ORDER — OB COMPLETE PETITE 35-5-1-200 MG PO CAPS: 1.0000 | ORAL_CAPSULE | Freq: Every day | ORAL | 12 refills | Status: DC

## 2016-01-29 NOTE — Telephone Encounter (Signed)
Please let her know that I have sent in ob complete.  I am not sure what her insurance will pay for.  Please let her know that she may need to get an OTC prenatal vitamin.  Thank you.  R.Mechelle Pates CNM

## 2016-02-01 ENCOUNTER — Ambulatory Visit (HOSPITAL_COMMUNITY): Payer: 59

## 2016-02-01 ENCOUNTER — Ambulatory Visit (HOSPITAL_COMMUNITY): Admission: RE | Admit: 2016-02-01 | Payer: 59 | Source: Ambulatory Visit

## 2016-02-04 ENCOUNTER — Ambulatory Visit (INDEPENDENT_AMBULATORY_CARE_PROVIDER_SITE_OTHER): Payer: 59 | Admitting: Certified Nurse Midwife

## 2016-02-04 DIAGNOSIS — Z3490 Encounter for supervision of normal pregnancy, unspecified, unspecified trimester: Secondary | ICD-10-CM

## 2016-02-04 DIAGNOSIS — Z3492 Encounter for supervision of normal pregnancy, unspecified, second trimester: Secondary | ICD-10-CM

## 2016-02-04 NOTE — Progress Notes (Signed)
Pt made aware of Materni21 results.

## 2016-02-04 NOTE — Progress Notes (Signed)
  Subjective:    Kelsey Jones is a 25 y.o. female being seen today for her obstetrical visit. She is at 4452w1d gestation. Patient reports: no complaints.  Problem List Items Addressed This Visit      Other   Supervision of normal pregnancy, antepartum   Relevant Orders   US OB Comp + 14 Wk     Patient Active Problem List   Diagnosis Date Noted  . Low vitamin D level 01/14/2016  . Supervision of normal pregnancy, antepartum 01/09/2016  . Prior pregnancy complicated by short cervix, antepartum 01/09/2016  . ASCUS pap smear but negative high-risk HPV on 10/16/2015.    10/23/2015    Objective:     BP 120/64   Pulse 88   Wt 154 lb (69.9 kg)   LMP 11/04/2015 (Approximate)   BMI 24.12 kg/m  Uterine Size: Below umbilicus   FHR 158  By doppler  Assessment:    Pregnancy @ 7652w1d  weeks Doing well    Plan:    Problem list reviewed and updated. Labs reviewed.  Follow up in 4 weeks. FIRST/CF mutation testing/NIPT/QUAD SCREEN/fragile X/Ashkenazi Jewish population testing/Spinal muscular atrophy discussed: results reviewed. Role of ultrasound in pregnancy discussed; fetal survey: ordered. Amniocentesis discussed: not indicated. 50% of 15 minute visit spent on counseling and coordination of care.

## 2016-02-06 ENCOUNTER — Encounter: Payer: 59 | Admitting: Certified Nurse Midwife

## 2016-02-25 ENCOUNTER — Emergency Department (HOSPITAL_COMMUNITY)
Admission: EM | Admit: 2016-02-25 | Discharge: 2016-02-25 | Disposition: A | Discharge status: Home / Self Care | Payer: 59 | Attending: Emergency Medicine | Admitting: Emergency Medicine

## 2016-02-25 ENCOUNTER — Encounter (HOSPITAL_COMMUNITY): Payer: Self-pay | Admitting: Emergency Medicine

## 2016-02-25 ENCOUNTER — Emergency Department (HOSPITAL_COMMUNITY): Payer: 59

## 2016-02-25 DIAGNOSIS — O26812 Pregnancy related exhaustion and fatigue, second trimester: Secondary | ICD-10-CM | POA: Diagnosis not present

## 2016-02-25 DIAGNOSIS — Z87891 Personal history of nicotine dependence: Secondary | ICD-10-CM | POA: Diagnosis not present

## 2016-02-25 DIAGNOSIS — N390 Urinary tract infection, site not specified: Secondary | ICD-10-CM

## 2016-02-25 DIAGNOSIS — O26892 Other specified pregnancy related conditions, second trimester: Secondary | ICD-10-CM | POA: Diagnosis present

## 2016-02-25 DIAGNOSIS — R55 Syncope and collapse: Secondary | ICD-10-CM | POA: Diagnosis not present

## 2016-02-25 DIAGNOSIS — R1033 Periumbilical pain: Secondary | ICD-10-CM | POA: Insufficient documentation

## 2016-02-25 DIAGNOSIS — O26899 Other specified pregnancy related conditions, unspecified trimester: Secondary | ICD-10-CM

## 2016-02-25 DIAGNOSIS — O9989 Other specified diseases and conditions complicating pregnancy, childbirth and the puerperium: Secondary | ICD-10-CM | POA: Diagnosis not present

## 2016-02-25 DIAGNOSIS — Z3A16 16 weeks gestation of pregnancy: Secondary | ICD-10-CM | POA: Diagnosis not present

## 2016-02-25 DIAGNOSIS — O2342 Unspecified infection of urinary tract in pregnancy, second trimester: Secondary | ICD-10-CM | POA: Insufficient documentation

## 2016-02-25 DIAGNOSIS — R109 Unspecified abdominal pain: Secondary | ICD-10-CM

## 2016-02-25 DIAGNOSIS — R42 Dizziness and giddiness: Secondary | ICD-10-CM

## 2016-02-25 LAB — CBC
HCT: 34.5 % — ABNORMAL LOW (ref 36.0–46.0)
Hemoglobin: 11.5 g/dL — ABNORMAL LOW (ref 12.0–15.0)
MCH: 26.3 pg (ref 26.0–34.0)
MCHC: 33.3 g/dL (ref 30.0–36.0)
MCV: 78.9 fL (ref 78.0–100.0)
PLATELETS: 209 10*3/uL (ref 150–400)
RBC: 4.37 MIL/uL (ref 3.87–5.11)
RDW: 14.1 % (ref 11.5–15.5)
WBC: 7.8 10*3/uL (ref 4.0–10.5)

## 2016-02-25 LAB — URINALYSIS, ROUTINE W REFLEX MICROSCOPIC
Bilirubin Urine: NEGATIVE
GLUCOSE, UA: NEGATIVE mg/dL
HGB URINE DIPSTICK: NEGATIVE
Ketones, ur: NEGATIVE mg/dL
NITRITE: NEGATIVE
Protein, ur: NEGATIVE mg/dL
SPECIFIC GRAVITY, URINE: 1.012 (ref 1.005–1.030)
pH: 7 (ref 5.0–8.0)

## 2016-02-25 LAB — COMPREHENSIVE METABOLIC PANEL
ALT: 47 U/L (ref 14–54)
AST: 35 U/L (ref 15–41)
Albumin: 3.7 g/dL (ref 3.5–5.0)
Alkaline Phosphatase: 41 U/L (ref 38–126)
Anion gap: 9 (ref 5–15)
BILIRUBIN TOTAL: 0.4 mg/dL (ref 0.3–1.2)
BUN: 7 mg/dL (ref 6–20)
CO2: 22 mmol/L (ref 22–32)
CREATININE: 0.4 mg/dL — AB (ref 0.44–1.00)
Calcium: 9.2 mg/dL (ref 8.9–10.3)
Chloride: 102 mmol/L (ref 101–111)
GFR calc Af Amer: >60 mL/min (ref >60)
Glucose, Bld: 109 mg/dL — ABNORMAL HIGH (ref 65–99)
Potassium: 3.5 mmol/L (ref 3.5–5.1)
Sodium: 133 mmol/L — ABNORMAL LOW (ref 135–145)
TOTAL PROTEIN: 7 g/dL (ref 6.5–8.1)

## 2016-02-25 LAB — I-STAT BETA HCG BLOOD, ED (MC, WL, AP ONLY)

## 2016-02-25 LAB — CBG MONITORING, ED: Glucose-Capillary: 102 mg/dL — ABNORMAL HIGH (ref 65–99)

## 2016-02-25 LAB — LIPASE, BLOOD: Lipase: 16 U/L (ref 11–51)

## 2016-02-25 MED ORDER — CEPHALEXIN 500 MG PO CAPS: 500.0000 mg | ORAL_CAPSULE | Freq: Three times a day (TID) | ORAL | 0 refills | Status: DC

## 2016-02-25 MED ORDER — SODIUM CHLORIDE 0.9 % IV BOLUS (SEPSIS)
1000.0000 mL | Freq: Once | INTRAVENOUS | Status: AC
  Administered 2016-02-25: 1000 mL via INTRAVENOUS

## 2016-02-25 NOTE — ED Provider Notes (Signed)
[facility]-EMERGENCY DEPT Provider Note   CSN: [id number] Arrival date & time: 02/25/16  1259     History   Chief Complaint Chief Complaint  Patient presents with  . Emesis  . Dizziness  . Abdominal Pain    HPI Kelsey Jones is a 25 y.o. female.  HPI Kelsey Jones is a 25 y.o. female with history of anemia and low vitamin D level, currently [redacted] weeks pregnant, presents to emergency department complaining of nausea, vomiting, abdominal pain, near syncopal episode today. Patient states she was at work when she went to the bathroom and suddenly became dizzy upon standing. She reports immediate nausea and had an episode of emesis at work and again in the waiting room. She reports some sharp abdominal pain around her umbilicus. States pain improved now. States the nausea improved and does not want anything for nausea. She states she is no longer dizzy. Denies any prior similar symptoms. This is her third pregnancy, patient had 1 abortion, one living child. She reports that she was high-risk first trimester due to vaginal bleeding, which has now resolved. Denies any vaginal discharge or bleeding. She is followed by [facility]  Past Medical History:  Diagnosis Date  . Medical history non-contributory     Patient Active Problem List   Diagnosis Date Noted  . Low vitamin D level 01/14/2016  . Supervision of normal pregnancy, antepartum 01/09/2016  . Prior pregnancy complicated by short cervix, antepartum 01/09/2016  . ASCUS pap smear but negative high-risk HPV on 10/16/2015.    10/23/2015    Past Surgical History:  Procedure Laterality Date  . INDUCED ABORTION      OB History    Gravida Para Term Preterm AB Living   3 1 1   1 1    SAB TAB Ectopic Multiple Live Births     0     1       Home Medications    Prior to Admission medications   Medication Sig Start Date End Date Taking? Authorizing Provider  Menthol (COUGH DROPS) 10 MG LOZG Use as directed 1 lozenge  in the mouth or throat every 2 (two) hours as needed (sore throat).   Yes Historical Provider, MD  Prenatal-DSS-FeCb-FeGl-FA (CITRANATAL BLOOM) 90-1 MG TABS Take 1 tablet by mouth daily. 01/09/16  Yes Rachelle A Denney, CNM  promethazine (PHENERGAN) 12.5 MG tablet Take 1 tablet (12.5 mg total) by mouth every 6 (six) hours as needed for nausea or vomiting. 12/24/15  Yes Judeth Horn, NP  acetaminophen (TYLENOL) 325 MG tablet Take 650 mg by mouth every 6 (six) hours as needed for headache.    Historical Provider, MD  cephALEXin (KEFLEX) 500 MG capsule Take 1 capsule (500 mg total) by mouth 3 (three) times daily. 02/25/16   Jazzman Loughmiller, PA-C  Prenat-FeCbn-FeAspGl-FA-Omega (OB COMPLETE PETITE) 35-5-1-200 MG CAPS Take 1 tablet by mouth daily. Patient not taking: Reported on 02/25/2016 01/29/16   Rachelle A Denney, CNM  Vitamin D, Ergocalciferol, (DRISDOL) 50000 units CAPS capsule Take 1 capsule (50,000 Units total) by mouth every 7 (seven) days. 01/14/16   Roe Coombs, CNM    Family History Family History  Problem Relation Age of Onset  . Diabetes Mother   . Cancer Maternal Grandmother   . Diabetes Maternal Grandmother   . Cancer Maternal Grandfather     Social History Social History  Substance Use Topics  . Smoking status: Former Smoker    Types: Cigars  . Smokeless tobacco: Never Used  .  Alcohol use No     Comment: Occasionally     Allergies   Patient has no known allergies.   Review of Systems Review of Systems  Constitutional: Negative for chills and fever.  Respiratory: Negative for cough, chest tightness and shortness of breath.   Cardiovascular: Negative for chest pain, palpitations and leg swelling.  Gastrointestinal: Positive for abdominal pain, nausea and vomiting. Negative for diarrhea.  Genitourinary: Negative for dysuria, flank pain and pelvic pain.  Musculoskeletal: Negative for arthralgias, myalgias, neck pain and neck stiffness.  Skin: Negative for  rash.  Neurological: Positive for dizziness. Negative for syncope, weakness and headaches.  All other systems reviewed and are negative.    Physical Exam Updated Vital Signs BP 124/86 (BP Location: Left Arm)   Pulse 70   Temp 98.2 F (36.8 C) (Oral)   Resp 14   Ht 5\' 7"  (1.702 m)   Wt 69.9 kg   LMP 11/04/2015 (Approximate)   SpO2 100%   BMI 24.12 kg/m   Physical Exam  Constitutional: She is oriented to person, place, and time. She appears well-developed and well-nourished. No distress.  HENT:  Head: Normocephalic.  Eyes: Conjunctivae are normal.  Neck: Neck supple.  Cardiovascular: Normal rate, regular rhythm and normal heart sounds.   Pulmonary/Chest: Effort normal and breath sounds normal. No respiratory distress. She has no wheezes. She has no rales.  Abdominal: Soft. Bowel sounds are normal. She exhibits no distension. There is no tenderness. There is no rebound and no guarding.  Gravid  Musculoskeletal: She exhibits no edema.  Neurological: She is alert and oriented to person, place, and time.  Skin: Skin is warm and dry.  Psychiatric: She has a normal mood and affect. Her behavior is normal.  Nursing note and vitals reviewed.    ED Treatments / Results  Labs (all labs ordered are listed, but only abnormal results are displayed) Labs Reviewed  COMPREHENSIVE METABOLIC PANEL - Abnormal; Notable for the following:       Result Value   Sodium 133 (*)    Glucose, Bld 109 (*)    Creatinine, Ser 0.40 (*)    All other components within normal limits  CBC - Abnormal; Notable for the following:    Hemoglobin 11.5 (*)    HCT 34.5 (*)    All other components within normal limits  URINALYSIS, ROUTINE W REFLEX MICROSCOPIC - Abnormal; Notable for the following:    APPearance CLOUDY (*)    Leukocytes, UA TRACE (*)    Bacteria, UA MANY (*)    Squamous Epithelial / LPF 0-5 (*)    All other components within normal limits  I-STAT BETA HCG BLOOD, ED ([facility], [facility], [facility] ONLY) -  Abnormal; Notable for the following:    I-stat hCG, quantitative >2,000.0 (*)    All other components within normal limits  CBG MONITORING, ED - Abnormal; Notable for the following:    Glucose-Capillary 102 (*)    All other components within normal limits  LIPASE, BLOOD    EKG  EKG Interpretation  Date/Time:  Monday February 25 2016 13:19:58 EST Ventricular Rate:  96 PR Interval:    QRS Duration: 100 QT Interval:  337 QTC Calculation: 426 R Axis:   70 Text Interpretation:  Sinus rhythm RSR' in V1 or V2, right VCD or RVH Nonspecific T abnormalities, anterior leads No old tracing to compare Confirmed by GOLDSTON MD, SCOTT [phone]([id number]) on 02/25/2016 4:28:17 PM       Radiology [place] Limited  Result  Date: 02/25/2016 CLINICAL DATA:  Abdominal pain for 1 day.  Pregnant patient. EXAM: LIMITED OBSTETRIC ULTRASOUND FINDINGS: Number of Fetuses: 1 Heart Rate:  165 bpm Movement: Yes Presentation: Cephalic Placental Location: Anterior Previa: No Amniotic Fluid (Subjective):  Within normal limits. BPD:  3.5cm 16w  5d MATERNAL FINDINGS: Cervix:  Appears closed. Uterus/Adnexae:  No abnormality visualized. IMPRESSION: 1. Single live intrauterine pregnancy with a measured gestational age of [redacted] weeks and 5 days, showing normal interval growth since the prior exam. 2. No pregnancy complication or from the emergent maternal abnormality. This exam is performed on an emergent basis and does not comprehensively evaluate fetal size, dating, or anatomy; follow-up complete OB US should be considered if further fetal assessment is warranted. Electronically Signed   By: Amie Portlandavid  Ormond M.D.   On: 02/25/2016 18:32    Procedures Procedures (including critical care time)  Medications Ordered in ED Medications  sodium chloride 0.9 % bolus 1,000 mL (1,000 mLs Intravenous New Bag/Given 02/25/16 1700)     Initial Impression / Assessment and Plan / ED Course  I have reviewed the triage vital signs and the nursing  notes.  Pertinent labs & imaging results that were available during my care of the patient were reviewed by me and considered in my medical decision making (see chart for details).  Clinical Course     Patient in emergency department with nausea, vomiting, sharp abdominal pain which now resolved, near syncopal episode. She denies losing consciousness completely. Will check orthostatics, will get labs. Due to abdominal pain will get ultrasound to assess pregnancy.  Patient is not orthostatic. She was given a liter bolus. She does not want anti-emetics. She states she is feeling much better. No vaginal discharge of fluid. Ultrasound is normal. She states she is hungry would like to be discharged home at this time. Patient will be discharged with outpatient follow-up with her OB/GYN. Her urine is contaminated, but concerning for infection, will cover with Keflex. It is cloudy with many bacteria. Hgb slightly low at 11.5 There is no proteinuria, blood pressure is normal, stable for discharge home. Return precautions discussed.  Vitals:   02/25/16 1309 02/25/16 1536 02/25/16 1731 02/25/16 1902  BP:  117/75 124/86 115/67  Pulse:  111 70 87  Resp:  16 14 16   Temp:    98.2 F (36.8 C)  TempSrc:    Oral  SpO2:  99% 100% 100%  Weight: 69.9 kg     Height: 5\' 7"  (1.702 m)        Final Clinical Impressions(s) / ED Diagnoses   Final diagnoses:  Dizziness  Abdominal pain affecting pregnancy  Near syncope  Urinary tract infection without hematuria, site unspecified    New Prescriptions New Prescriptions   CEPHALEXIN (KEFLEX) 500 MG CAPSULE    Take 1 capsule (500 mg total) by mouth 3 (three) times daily.     Jaynie Crumbleatyana Autumne Kallio, PA-C 02/26/16 0130    Pricilla LovelessScott Goldston, MD 02/26/16 1515

## 2016-02-25 NOTE — Discharge Instructions (Signed)
Drink plenty of fluids. Eat well balanced meals. Do not skip meals. Take keflex as prescribed until all gone. Make sure to call your OB tomorrow and let them know you were here today. Return if worsening symptoms.

## 2016-02-25 NOTE — ED Triage Notes (Addendum)
Patient reports she is [redacted] weeks pregnant, states she has been dizzy with 1 episode of vomiting since this morning. Denies diarrhea. Ambulatory to triage.  Patient also reports upper abdominal pain.

## 2016-03-03 ENCOUNTER — Ambulatory Visit (INDEPENDENT_AMBULATORY_CARE_PROVIDER_SITE_OTHER): Payer: 59 | Admitting: Obstetrics & Gynecology

## 2016-03-03 VITALS — BP 101/64 | HR 83 | Wt 157.0 lb

## 2016-03-03 DIAGNOSIS — Z3480 Encounter for supervision of other normal pregnancy, unspecified trimester: Secondary | ICD-10-CM

## 2016-03-03 DIAGNOSIS — Z3482 Encounter for supervision of other normal pregnancy, second trimester: Secondary | ICD-10-CM

## 2016-03-03 NOTE — Progress Notes (Signed)
   PRENATAL VISIT NOTE  Subjective:  Kelsey Jones is a 25 y.o. AA [id number] 23(25 yo daughter at home, female fetus) at 552w1d being seen today for ongoing prenatal care.  She is currently monitored for the following issues for this low-risk pregnancy and has ASCUS pap smear but negative high-risk HPV on 10/16/2015.   ; Supervision of normal pregnancy, antepartum; Prior pregnancy complicated by short cervix, antepartum; and Low vitamin D level on her problem list.  Patient reports no complaints.  Contractions: Not present. Vag. Bleeding: None.   . Denies leaking of fluid.   The following portions of the patient's history were reviewed and updated as appropriate: allergies, current medications, past family history, past medical history, past social history, past surgical history and problem list. Problem list updated.  Objective:   Vitals:   03/03/16 0840  BP: 101/64  Pulse: 83  Weight: 157 lb (71.2 kg)    Fetal Status:           General:  Alert, oriented and cooperative. Patient is in no acute distress.  Skin: Skin is warm and dry. No rash noted.   Cardiovascular: Normal heart rate noted  Respiratory: Normal respiratory effort, no problems with respiration noted  Abdomen: Soft, gravid, appropriate for gestational age. Pain/Pressure: Absent     Pelvic:  Cervical exam deferred        Extremities: Normal range of motion.     Mental Status: Normal mood and affect. Normal behavior. Normal judgment and thought content.   Assessment and Plan:  Pregnancy: G3P1011 at 2852w1d  1. Supervision of other normal pregnancy, antepartum - Anatomy u/s scheduled for 03-13-16. - AFP, Serum, Open Spina Bifida  Preterm labor symptoms and general obstetric precautions including but not limited to vaginal bleeding, contractions, leaking of fluid and fetal movement were reviewed in detail with the patient. Please refer to After Visit Summary for other counseling recommendations.  Return in about 4 weeks  (around 03/31/2016).   Allie BossierMyra C Imaad Reuss, MD

## 2016-03-05 LAB — AFP, SERUM, OPEN SPINA BIFIDA
AFP MOM: 0.95
AFP Value: 39.6 ng/mL
Gest. Age on Collection Date: 17.3 weeks
Maternal Age At EDD: 25.5 years
OSBR RISK 1 IN: 10000
TEST RESULTS AFP: NEGATIVE
WEIGHT: 157 [lb_av]

## 2016-03-13 ENCOUNTER — Ambulatory Visit: Payer: 59

## 2016-03-13 ENCOUNTER — Other Ambulatory Visit: Payer: Self-pay | Admitting: Certified Nurse Midwife

## 2016-03-13 DIAGNOSIS — Z3490 Encounter for supervision of normal pregnancy, unspecified, unspecified trimester: Secondary | ICD-10-CM

## 2016-03-13 DIAGNOSIS — O09899 Supervision of other high risk pregnancies, unspecified trimester: Secondary | ICD-10-CM

## 2016-03-14 ENCOUNTER — Encounter: Payer: Self-pay | Admitting: *Deleted

## 2016-03-17 NOTE — L&D Delivery Note (Signed)
Delivery Note At 7:12 PM a viable female was delivered via Vaginal, Spontaneous Delivery (Presentation:vertex ; LOA ).  APGAR: 9, 9; weight  .   Placenta status:spont ,shultz .  Cord:3vc  with the following complications:none .  Cord pH: n/a  Anesthesia:epidural   Episiotomy: None Lacerations: None Suture Repair: n/a Est. Blood Loss (mL): 100  Mom to postpartum.  Baby to Couplet care / Skin to Skin.  Kelsey Jones 08/03/2016, 7:19 PM

## 2016-03-27 ENCOUNTER — Other Ambulatory Visit: Payer: 59

## 2016-03-31 ENCOUNTER — Other Ambulatory Visit (HOSPITAL_COMMUNITY)
Admission: RE | Admit: 2016-03-31 | Discharge: 2016-03-31 | Disposition: A | Discharge status: Home / Self Care | Payer: 59 | Source: Ambulatory Visit | Attending: Certified Nurse Midwife | Admitting: Certified Nurse Midwife

## 2016-03-31 ENCOUNTER — Ambulatory Visit (INDEPENDENT_AMBULATORY_CARE_PROVIDER_SITE_OTHER): Payer: 59 | Admitting: Certified Nurse Midwife

## 2016-03-31 ENCOUNTER — Encounter: Payer: Self-pay | Admitting: Certified Nurse Midwife

## 2016-03-31 VITALS — BP 113/70 | HR 84 | Temp 98.0°F | Wt 161.8 lb

## 2016-03-31 DIAGNOSIS — R7989 Other specified abnormal findings of blood chemistry: Secondary | ICD-10-CM

## 2016-03-31 DIAGNOSIS — Z3A21 21 weeks gestation of pregnancy: Secondary | ICD-10-CM | POA: Diagnosis not present

## 2016-03-31 DIAGNOSIS — Z3480 Encounter for supervision of other normal pregnancy, unspecified trimester: Secondary | ICD-10-CM

## 2016-03-31 DIAGNOSIS — B373 Candidiasis of vulva and vagina: Secondary | ICD-10-CM | POA: Diagnosis not present

## 2016-03-31 DIAGNOSIS — Z3481 Encounter for supervision of other normal pregnancy, first trimester: Secondary | ICD-10-CM

## 2016-03-31 DIAGNOSIS — O98812 Other maternal infectious and parasitic diseases complicating pregnancy, second trimester: Secondary | ICD-10-CM | POA: Diagnosis present

## 2016-03-31 DIAGNOSIS — B3731 Acute candidiasis of vulva and vagina: Secondary | ICD-10-CM

## 2016-03-31 DIAGNOSIS — O09899 Supervision of other high risk pregnancies, unspecified trimester: Secondary | ICD-10-CM

## 2016-03-31 MED ORDER — TERCONAZOLE 0.8 % VA CREA: 1.0000 | TOPICAL_CREAM | Freq: Every day | VAGINAL | 0 refills | Status: DC

## 2016-03-31 MED ORDER — FLUCONAZOLE 150 MG PO TABS: 150.0000 mg | ORAL_TABLET | Freq: Once | ORAL | 0 refills | Status: AC

## 2016-03-31 MED ORDER — OB COMPLETE PETITE 35-5-1-200 MG PO CAPS: 1.0000 | ORAL_CAPSULE | Freq: Every day | ORAL | 12 refills | Status: DC

## 2016-03-31 NOTE — Patient Instructions (Addendum)
AREA PEDIATRIC/FAMILY PRACTICE PHYSICIANS  [facility] [address] [place], [place]  [address] Phone - [phone]   Fax - [phone]  [facility] [address] [place], [place] [address] Phone - [phone]   Fax - [phone]  JACK AMOS [address] [place], [place]  [address] Phone - [phone]   Fax - [phone]  [facility] [address] [place], [place]  [address] Phone - [phone]   Fax - [phone]  [facility] [address] [place], [place]  [address] Phone - [phone]   Fax - [phone]  [facility] [address] [place], [place]  [address] Phone - [phone]   Fax - [phone]  [facility] [place] [address] [place], [place]  [address] Phone - [phone]   Fax - [phone]  [facility] AT [facility] [address] [place], [address] [place], [place]  [address] Phone - [phone]   Fax - [phone]  [facility] AT [facility] [address] [place], [place]  [address] Phone - [phone]   Fax - [phone] [facility] AT [place] [address] [place], [place]  [address] Phone - [phone]   Fax - [phone]  [facility] [address] [place], [place]  [address] Phone - [phone]   Fax - [phone]  [facility] [address], [address] [place], [place]  [address] Phone - [phone]   Fax - [phone]  [facility] [place] [address] [place], [place]  [address] Phone - [phone]   Fax - [phone]  [facility] [address] [place], [place]  [address] Phone - [phone]  [facility] [address] [place], [place]  [address] Phone - [phone]   Fax - [phone]  [facility] [address] [place], [place]  [address] Phone - [phone]   Fax - [phone]  [facility] [address] [place], [place]  [address] Phone - [phone]   Fax - [phone]  [facility] [address] [place], [place]  [address] Phone - [phone]   Fax - [phone]   [facility] [address] [place], [place]  [address] Phone - [phone]   Fax - [phone]  [facility] [address] [place], [place]  [address] Phone - [phone]   Fax - [phone]  DAVID RUBIN [address] [place], [place]  [address] Phone - [phone]   Fax - [phone]  [facility] [address] [place], [place]  [address] Phone - [phone]   Fax - [phone]  [place] [facility] Alita Chyle [address] [place], [place]  [address] Phone - [phone]   Fax - [phone] Gerarda Fraction [address] [place], [place]  [address] Phone - [phone]   Fax - [phone]  [facility] [address] [place], [place]  [address] Phone - [phone]   Fax - [phone]  [facility] - [place] [address] [place], [place]  [address] Phone - [phone]   Fax - [phone]    Vaginal Yeast infection, Adult Vaginal yeast infection is a condition that causes soreness, swelling, and redness (inflammation) of the vagina. It also causes vaginal discharge. This is a common condition. Some women  get this infection frequently. What are the causes? This condition is caused by a change in the normal balance of the yeast (candida) and bacteria that live in the vagina. This change causes an overgrowth of yeast, which causes the inflammation. What increases the risk? This condition is more likely to develop in:  Women who take antibiotic medicines.  Women who have diabetes.  Women who take birth control  pills.  Women who are pregnant.  Women who douche often.  Women who have a weak defense (immune) system.  Women who have been taking steroid medicines for a long time.  Women who frequently wear tight clothing. What are the signs or symptoms? Symptoms of this condition include:  White, thick vaginal discharge.  Swelling, itching, redness, and irritation of the vagina. The lips of the vagina (vulva) may be affected as well.  Pain or a burning feeling while urinating.  Pain during sex. How is this diagnosed? This condition is diagnosed with a medical history and physical exam. This will include a pelvic exam. Your health care provider will examine a sample of your vaginal discharge under a microscope. Your health care provider may send this sample for testing to confirm the diagnosis. How is this treated? This condition is treated with medicine. Medicines may be over-the-counter or prescription. You may be told to use one or more of the following:  Medicine that is taken orally.  Medicine that is applied as a cream.  Medicine that is inserted directly into the vagina (suppository). Follow these instructions at home:  Take or apply over-the-counter and prescription medicines only as told by your health care provider.  Do not have sex until your health care provider has approved. Tell your sex partner that you have a yeast infection. That person should go to his or her health care provider if he or she develops symptoms.  Do not wear tight clothes, such as pantyhose or tight pants.  Avoid using tampons until your health care provider approves.  Eat more yogurt. This may help to keep your yeast infection from returning.  Try taking a sitz bath to help with discomfort. This is a warm water bath that is taken while you are sitting down. The water should only come up to your hips and should cover your buttocks. Do this 3-4 times per day or as told by your health care  provider.  Do not douche.  Wear breathable, cotton underwear.  If you have diabetes, keep your blood sugar levels under control. Contact a health care provider if:  You have a fever.  Your symptoms go away and then return.  Your symptoms do not get better with treatment.  Your symptoms get worse.  You have new symptoms.  You develop blisters in or around your vagina.  You have blood coming from your vagina and it is not your menstrual period.  You develop pain in your abdomen. This information is not intended to replace advice given to you by your health care provider. Make sure you discuss any questions you have with your health care provider. Document Released: 12/11/2004 Document Revised: 08/15/2015 Document Reviewed: 09/04/2014 Elsevier Interactive Patient Education  2017 [employer] Second Trimester of Pregnancy The second trimester is from week 13 through week 28 (months 4 through 6). The second trimester is often a time when you feel your best. Your body has also adjusted to being pregnant, and you begin to feel better physically. Usually, morning sickness has lessened or quit completely, you may  have more energy, and you may have an increase in appetite. The second trimester is also a time when the fetus is growing rapidly. At the end of the sixth month, the fetus is about 9 inches long and weighs about 1 pounds. You will likely begin to feel the baby move (quickening) between 18 and 20 weeks of the pregnancy. Body changes during your second trimester Your body continues to go through many changes during your second trimester. The changes vary from woman to woman.  Your weight will continue to increase. You will notice your lower abdomen bulging out.  You may begin to get stretch marks on your hips, abdomen, and breasts.  You may develop headaches that can be relieved by medicines. The medicines should be approved by your health care provider.  You may urinate more  often because the fetus is pressing on your bladder.  You may develop or continue to have heartburn as a result of your pregnancy.  You may develop constipation because certain hormones are causing the muscles that push waste through your intestines to slow down.  You may develop hemorrhoids or swollen, bulging veins (varicose veins).  You may have back pain. This is caused by:  Weight gain.  Pregnancy hormones that are relaxing the joints in your pelvis.  A shift in weight and the muscles that support your balance.  Your breasts will continue to grow and they will continue to become tender.  Your gums may bleed and may be sensitive to brushing and flossing.  Dark spots or blotches (chloasma, mask of pregnancy) may develop on your face. This will likely fade after the baby is born.  A dark line from your belly button to the pubic area (linea nigra) may appear. This will likely fade after the baby is born.  You may have changes in your hair. These can include thickening of your hair, rapid growth, and changes in texture. Some women also have hair loss during or after pregnancy, or hair that feels dry or thin. Your hair will most likely return to normal after your baby is born. What to expect at prenatal visits During a routine prenatal visit:  You will be weighed to make sure you and the fetus are growing normally.  Your blood pressure will be taken.  Your abdomen will be measured to track your baby's growth.  The fetal heartbeat will be listened to.  Any test results from the previous visit will be discussed. Your health care provider may ask you:  How you are feeling.  If you are feeling the baby move.  If you have had any abnormal symptoms, such as leaking fluid, bleeding, severe headaches, or abdominal cramping.  If you are using any tobacco products, including cigarettes, chewing tobacco, and electronic cigarettes.  If you have any questions. Other tests that may  be performed during your second trimester include:  Blood tests that check for:  Low iron levels (anemia).  Gestational diabetes (between 24 and 28 weeks).  Rh antibodies. This is to check for a protein on red blood cells (Rh factor).  Urine tests to check for infections, diabetes, or protein in the urine.  An ultrasound to confirm the proper growth and development of the baby.  An amniocentesis to check for possible genetic problems.  Fetal screens for spina bifida and Down syndrome.  HIV (human immunodeficiency virus) testing. Routine prenatal testing includes screening for HIV, unless you choose not to have this test. Follow these instructions at home: Eating  and drinking  Continue to eat regular, healthy meals.  Avoid raw meat, uncooked cheese, cat litter boxes, and soil used by cats. These carry germs that can cause birth defects in the baby.  Take your prenatal vitamins.  Take 1500-2000 mg of calcium daily starting at the 20th week of pregnancy until you deliver your baby.  If you develop constipation:  Take over-the-counter or prescription medicines.  Drink enough fluid to keep your urine clear or pale yellow.  Eat foods that are high in fiber, such as fresh fruits and vegetables, whole grains, and beans.  Limit foods that are high in fat and processed sugars, such as fried and sweet foods. Activity  Exercise only as directed by your health care provider. Experiencing uterine cramps is a good sign to stop exercising.  Avoid heavy lifting, wear low heel shoes, and practice good posture.  Wear your seat belt at all times when driving.  Rest with your legs elevated if you have leg cramps or low back pain.  Wear a good support bra for breast tenderness.  Do not use hot tubs, steam rooms, or saunas. Lifestyle  Avoid all smoking, herbs, alcohol, and unprescribed drugs. These chemicals affect the formation and growth of the baby.  Do not use any products that  contain nicotine or tobacco, such as cigarettes and e-cigarettes. If you need help quitting, ask your health care provider.  A sexual relationship may be continued unless your health care provider directs you otherwise. General instructions  Follow your health care provider's instructions regarding medicine use. There are medicines that are either safe or unsafe to take during pregnancy.  Take warm sitz baths to soothe any pain or discomfort caused by hemorrhoids. Use hemorrhoid cream if your health care provider approves.  If you develop varicose veins, wear support hose. Elevate your feet for 15 minutes, 3-4 times a day. Limit salt in your diet.  Visit your dentist if you have not gone yet during your pregnancy. Use a soft toothbrush to brush your teeth and be gentle when you floss.  Keep all follow-up prenatal visits as told by your health care provider. This is important. Contact a health care provider if:  You have dizziness.  You have mild pelvic cramps, pelvic pressure, or nagging pain in the abdominal area.  You have persistent nausea, vomiting, or diarrhea.  You have a bad smelling vaginal discharge.  You have pain with urination. Get help right away if:  You have a fever.  You are leaking fluid from your vagina.  You have spotting or bleeding from your vagina.  You have severe abdominal cramping or pain.  You have rapid weight gain or weight loss.  You have shortness of breath with chest pain.  You notice sudden or extreme swelling of your face, hands, ankles, feet, or legs.  You have not felt your baby move in over an hour.  You have severe headaches that do not go away with medicine.  You have vision changes. Summary  The second trimester is from week 13 through week 28 (months 4 through 6). It is also a time when the fetus is growing rapidly.  Your body goes through many changes during pregnancy. The changes vary from woman to woman.  Avoid all  smoking, herbs, alcohol, and unprescribed drugs. These chemicals affect the formation and growth your baby.  Do not use any tobacco products, such as cigarettes, chewing tobacco, and e-cigarettes. If you need help quitting, ask your health care provider.  Contact your health care provider if you have any questions. Keep all prenatal visits as told by your health care provider. This is important. This information is not intended to replace advice given to you by your health care provider. Make sure you discuss any questions you have with your health care provider. Document Released: 02/25/2001 Document Revised: 08/09/2015 Document Reviewed: 05/04/2012 Elsevier Interactive Patient Education  2017 [employer]

## 2016-03-31 NOTE — Progress Notes (Signed)
   PRENATAL VISIT NOTE  Subjective:  Stephannie Lieisha S Wyche is a 26 y.o. G3P1011 at 7466w1d being seen today for ongoing prenatal care.  She is currently monitored for the following issues for this low-risk pregnancy and has ASCUS pap smear but negative high-risk HPV on 10/16/2015.   ; Supervision of normal pregnancy, antepartum; Prior pregnancy complicated by short cervix, antepartum; and Low vitamin D level on her problem list.  Patient reports no bleeding, no contractions, no cramping, no leaking and vaginal irritation.  Contractions: Not present. Vag. Bleeding: None.  Movement: Present. Denies leaking of fluid.   The following portions of the patient's history were reviewed and updated as appropriate: allergies, current medications, past family history, past medical history, past social history, past surgical history and problem list. Problem list updated.  Objective:   Vitals:   03/31/16 1622  BP: 113/70  Pulse: 84  Temp: 98 F (36.7 C)  Weight: 161 lb 12.8 oz (73.4 kg)    Fetal Status: Fetal Heart Rate (bpm): 150 Fundal Height: 20 cm Movement: Present     General:  Alert, oriented and cooperative. Patient is in no acute distress.  Skin: Skin is warm and dry. No rash noted.   Cardiovascular: Normal heart rate noted  Respiratory: Normal respiratory effort, no problems with respiration noted  Abdomen: Soft, gravid, appropriate for gestational age. Pain/Pressure: Absent     Pelvic:  Cervical exam deferred        Extremities: Normal range of motion.     Mental Status: Normal mood and affect. Normal behavior. Normal judgment and thought content.   Assessment and Plan:  Pregnancy: G3P1011 at 4866w1d  1. Yeast vaginitis - fluconazole (DIFLUCAN) 150 MG tablet; Take 1 tablet (150 mg total) by mouth once.  Dispense: 1 tablet; Refill: 0 - terconazole (TERAZOL 3) 0.8 % vaginal cream; Place 1 applicator vaginally at bedtime.  Dispense: 20 g; Refill: 0  2. Low vitamin D level    On vitamin D.     3. Prior pregnancy complicated by short cervix, antepartum     Cervical length normal at anatomy US  4. Supervision of other normal pregnancy, antepartum        Preterm labor symptoms and general obstetric precautions including but not limited to vaginal bleeding, contractions, leaking of fluid and fetal movement were reviewed in detail with the patient. Please refer to After Visit Summary for other counseling recommendations.  Return in about 4 weeks (around 04/28/2016) for ROB.   Roe Coombsachelle A Yetzali Weld, CNM

## 2016-04-03 ENCOUNTER — Other Ambulatory Visit: Payer: Self-pay | Admitting: Certified Nurse Midwife

## 2016-04-03 LAB — CERVICOVAGINAL ANCILLARY ONLY
Bacterial vaginitis: NEGATIVE
CANDIDA VAGINITIS: POSITIVE — AB
Chlamydia: NEGATIVE
Neisseria Gonorrhea: NEGATIVE
TRICH (WINDOWPATH): NEGATIVE

## 2016-04-04 ENCOUNTER — Other Ambulatory Visit: Payer: Self-pay | Admitting: Certified Nurse Midwife

## 2016-04-04 ENCOUNTER — Ambulatory Visit (INDEPENDENT_AMBULATORY_CARE_PROVIDER_SITE_OTHER): Payer: 59

## 2016-04-04 ENCOUNTER — Telehealth: Payer: Self-pay | Admitting: *Deleted

## 2016-04-04 DIAGNOSIS — O26879 Cervical shortening, unspecified trimester: Secondary | ICD-10-CM | POA: Insufficient documentation

## 2016-04-04 DIAGNOSIS — O09899 Supervision of other high risk pregnancies, unspecified trimester: Secondary | ICD-10-CM

## 2016-04-04 DIAGNOSIS — Z3482 Encounter for supervision of other normal pregnancy, second trimester: Secondary | ICD-10-CM

## 2016-04-04 DIAGNOSIS — O09299 Supervision of pregnancy with other poor reproductive or obstetric history, unspecified trimester: Secondary | ICD-10-CM | POA: Insufficient documentation

## 2016-04-04 MED ORDER — PROGESTERONE MICRONIZED 200 MG PO CAPS: 200.0000 mg | ORAL_CAPSULE | Freq: Every day | ORAL | 9 refills | Status: DC

## 2016-04-04 NOTE — Telephone Encounter (Signed)
U/s was reviewed by RDenney.  Prometrium 200mg  per vagina was ordered. Pt was made aware of results and Rx sent to pharmacy.

## 2016-04-08 ENCOUNTER — Other Ambulatory Visit: Payer: Self-pay | Admitting: Certified Nurse Midwife

## 2016-04-15 ENCOUNTER — Other Ambulatory Visit: Payer: Self-pay | Admitting: Obstetrics

## 2016-04-15 DIAGNOSIS — O26872 Cervical shortening, second trimester: Secondary | ICD-10-CM

## 2016-04-24 ENCOUNTER — Ambulatory Visit (HOSPITAL_COMMUNITY): Admission: RE | Admit: 2016-04-24 | Payer: 59 | Source: Ambulatory Visit

## 2016-04-28 ENCOUNTER — Encounter: Payer: Self-pay | Admitting: *Deleted

## 2016-04-28 ENCOUNTER — Ambulatory Visit (INDEPENDENT_AMBULATORY_CARE_PROVIDER_SITE_OTHER): Payer: 59 | Admitting: Certified Nurse Midwife

## 2016-04-28 VITALS — BP 101/70 | HR 91 | Wt 164.0 lb

## 2016-04-28 DIAGNOSIS — O26872 Cervical shortening, second trimester: Secondary | ICD-10-CM

## 2016-04-28 DIAGNOSIS — O0990 Supervision of high risk pregnancy, unspecified, unspecified trimester: Secondary | ICD-10-CM

## 2016-04-28 DIAGNOSIS — R7989 Other specified abnormal findings of blood chemistry: Secondary | ICD-10-CM

## 2016-04-28 DIAGNOSIS — O26879 Cervical shortening, unspecified trimester: Secondary | ICD-10-CM

## 2016-04-28 NOTE — Progress Notes (Signed)
   PRENATAL VISIT NOTE  Subjective:  Kelsey Jones is a 26 y.o. G3P1011 at 1252w1d being seen today for ongoing prenatal care.  She is currently monitored for the following issues for this high-risk pregnancy and has ASCUS pap smear but negative high-risk HPV on 10/16/2015.   ; Supervision of high risk pregnancy, antepartum; Prior pregnancy complicated by short cervix, antepartum; Low vitamin D level; and Short cervix affecting pregnancy on her problem list.  Patient reports backache, no bleeding, no contractions, no cramping, no leaking and states increased back pain at night after working, desires maternity support belt, also reports bilateral foot pain after working.  Contractions: Not present. Vag. Bleeding: None.  Movement: Present. Denies leaking of fluid.   The following portions of the patient's history were reviewed and updated as appropriate: allergies, current medications, past family history, past medical history, past social history, past surgical history and problem list. Problem list updated.  Objective:   Vitals:   04/28/16 1350  BP: 101/70  Pulse: 91  Weight: 164 lb (74.4 kg)    Fetal Status:     Movement: Present     General:  Alert, oriented and cooperative. Patient is in no acute distress.  Skin: Skin is warm and dry. No rash noted.   Cardiovascular: Normal heart rate noted  Respiratory: Normal respiratory effort, no problems with respiration noted  Abdomen: Soft, gravid, appropriate for gestational age. Pain/Pressure: Absent     Pelvic:  Cervical exam deferred        Extremities: Normal range of motion.     Mental Status: Normal mood and affect. Normal behavior. Normal judgment and thought content.   Assessment and Plan:  Pregnancy: G3P1011 at 5752w1d  1. Supervision of high risk pregnancy, antepartum     Short Cervix.  Work note given to decrease hours to 6/day along with increased breaks.  RX given for abdominal maternity support belt.    2. Short cervix  affecting pregnancy     On daily micronized progesterone.    3. Low vitamin D level     On weekly vitamin D supplementation.   Preterm labor symptoms and general obstetric precautions including but not limited to vaginal bleeding, contractions, leaking of fluid and fetal movement were reviewed in detail with the patient. Please refer to After Visit Summary for other counseling recommendations.  Return in about 2 weeks (around 05/12/2016) [facility] 2 hr OGTT.   Roe Coombsachelle A Miabella Shannahan, CNM

## 2016-04-28 NOTE — Progress Notes (Signed)
Please discuss work with pt, may need letter. Pt states she is on her feet all day and has pain, back pain as well. ?need for support belt.

## 2016-04-28 NOTE — Patient Instructions (Signed)
AREA PEDIATRIC/FAMILY PRACTICE PHYSICIANS  [facility] [address] [place], [place]  [address] Phone - [phone]   Fax - [phone]  [facility] [address] [place], [place] [address] Phone - [phone]   Fax - [phone]  JACK AMOS [address] [place], [place]  [address] Phone - [phone]   Fax - [phone]  [facility] [address] [place], [place]  [address] Phone - [phone]   Fax - [phone]  [facility] [address] [place], [place]  [address] Phone - [phone]   Fax - [phone]  [facility] [address] [place], [place]  [address] Phone - [phone]   Fax - [phone]  [facility] [place] [address] [place], [place]  [address] Phone - [phone]   Fax - [phone]  [facility] [address] [place], [place]  [address] Phone - [phone]   Fax - [phone]  [facility] [address] [place], [place]  [address] Phone - [phone]   Fax - [phone] [facility] [address] [place], [place]  [address] Phone - [phone]   Fax - [phone]  [facility] [address] [place], [place]  [address] Phone - [phone]   Fax - [phone]  [facility] [address] [place], [place]  [address] Phone - [phone]   Fax - [phone]  [facility] [address] [place], [place]  [address] Phone - [phone]   Fax - [phone]  SHILPA GOSRANI [address] [place], [place]  [address] Phone - [phone]  [facility] [address] [place], [place]  [address] Phone - [phone]   Fax - [phone]  [facility] [address] [place], [place]  [address] Phone - [phone]   Fax - [phone]  [facility] [address] [place], [place]  [address] Phone - [phone]   Fax - [phone]  [facility] [address] [place], [place]  [address] Phone - [phone]   Fax - [phone]   [facility] [address] [place], [place]  [address] Phone - [phone]   Fax - [phone]  [facility] [address] [place], [place]  [address] Phone - [phone]   Fax - [phone]  DAVID RUBIN [address] , [place]  [address] Phone - [phone]   Fax - [phone]  [facility] [address] , [place]  [address] Phone - [phone]   Fax - [phone]  [facility] [address] , [place]  [address] Phone - [phone]   Fax - [phone] [facility] [address] [place], [place]  [address] Phone - [phone]   Fax - [phone]  [facility] [address] [place], [place]  [address] Phone - [phone]   Fax - [phone]   [facility] [place] [address] ,   [address] Phone - [phone]   Fax - [phone]   

## 2016-04-30 ENCOUNTER — Inpatient Hospital Stay (HOSPITAL_COMMUNITY)
Admission: AD | Admit: 2016-04-30 | Discharge: 2016-04-30 | Disposition: A | Discharge status: Home / Self Care | Payer: 59 | Source: Ambulatory Visit | Attending: Obstetrics and Gynecology | Admitting: Obstetrics and Gynecology

## 2016-04-30 ENCOUNTER — Telehealth: Payer: Self-pay

## 2016-04-30 ENCOUNTER — Encounter (HOSPITAL_COMMUNITY): Payer: Self-pay | Admitting: *Deleted

## 2016-04-30 DIAGNOSIS — W228XXA Striking against or struck by other objects, initial encounter: Secondary | ICD-10-CM | POA: Diagnosis not present

## 2016-04-30 DIAGNOSIS — Z87891 Personal history of nicotine dependence: Secondary | ICD-10-CM | POA: Insufficient documentation

## 2016-04-30 DIAGNOSIS — Z3A25 25 weeks gestation of pregnancy: Secondary | ICD-10-CM | POA: Diagnosis not present

## 2016-04-30 DIAGNOSIS — O0990 Supervision of high risk pregnancy, unspecified, unspecified trimester: Secondary | ICD-10-CM

## 2016-04-30 DIAGNOSIS — O26892 Other specified pregnancy related conditions, second trimester: Secondary | ICD-10-CM | POA: Diagnosis present

## 2016-04-30 DIAGNOSIS — S3991XA Unspecified injury of abdomen, initial encounter: Secondary | ICD-10-CM | POA: Diagnosis not present

## 2016-04-30 LAB — URINALYSIS, ROUTINE W REFLEX MICROSCOPIC
BILIRUBIN URINE: NEGATIVE
Glucose, UA: NEGATIVE mg/dL
Hgb urine dipstick: NEGATIVE
Ketones, ur: NEGATIVE mg/dL
LEUKOCYTES UA: NEGATIVE
Nitrite: NEGATIVE
Protein, ur: 30 mg/dL — AB
SPECIFIC GRAVITY, URINE: 1.025 (ref 1.005–1.030)
pH: 6 (ref 5.0–8.0)

## 2016-04-30 NOTE — MAU Provider Note (Signed)
Chief Complaint:  No chief complaint on file.   First Provider Initiated Contact with Patient 04/30/16 1234     HPI: Stephannie Lieisha S Borntreger is a 26 y.o. G3P1011 at 3625w3dwho presents to maternity admissions reporting bumping into a cardboard box at work today.  Right after that, states uterus started tightening up and she had pain in lower abdomen.  She reports good fetal movement, denies LOF, vaginal bleeding, vaginal itching/burning, urinary symptoms, h/a, dizziness, n/v, diarrhea, constipation or fever/chills.    Abdominal Pain  This is a new problem. The current episode started today. The onset quality is sudden. The problem occurs intermittently. The problem has been unchanged. The pain is located in the RLQ, LLQ and suprapubic region. The pain is mild. The quality of the pain is cramping and sharp. The abdominal pain does not radiate. Pertinent negatives include no constipation, diarrhea, dysuria, fever, myalgias, nausea or vomiting. The pain is aggravated by palpation. The pain is relieved by nothing. She has tried nothing for the symptoms.   RN note: Pt reports she hit her abd on a big box at work around 10:40, denies bleeding. Reports her abd feels really tight and she is having cramping.   Past Medical History: Past Medical History:  Diagnosis Date  . Medical history non-contributory     Past obstetric history: OB History  Gravida Para Term Preterm AB Living  3 1 1   1 1   SAB TAB Ectopic Multiple Live Births    0     1    # Outcome Date GA Lbr Len/2nd Weight Sex Delivery Anes PTL Lv  3 Current           2 Term 03/17/10 4251w0d   F Vag-Spont   LIV  1 AB               Past Surgical History: Past Surgical History:  Procedure Laterality Date  . INDUCED ABORTION      Family History: Family History  Problem Relation Age of Onset  . Diabetes Mother   . Cancer Maternal Grandmother   . Diabetes Maternal Grandmother   . Cancer Maternal Grandfather     Social History: Social  History  Substance Use Topics  . Smoking status: Former Smoker    Types: Cigars  . Smokeless tobacco: Never Used  . Alcohol use No     Comment: Occasionally    Allergies: No Known Allergies  Meds:  Prescriptions Prior to Admission  Medication Sig Dispense Refill Last Dose  . Prenat-FeCbn-FeAspGl-FA-Omega (OB COMPLETE PETITE) 35-5-1-200 MG CAPS Take 1 tablet by mouth daily. 30 capsule 12 04/29/2016 at Unknown time  . progesterone (PROMETRIUM) 200 MG capsule Place 1 capsule (200 mg total) vaginally daily. 30 capsule 9 04/29/2016 at Unknown time  . cephALEXin (KEFLEX) 500 MG capsule Take 1 capsule (500 mg total) by mouth 3 (three) times daily. (Patient not taking: Reported on 04/30/2016) 15 capsule 0 Completed Course at Unknown time  . Prenatal-DSS-FeCb-FeGl-FA (CITRANATAL BLOOM) 90-1 MG TABS Take 1 tablet by mouth daily. (Patient not taking: Reported on 04/30/2016) 30 tablet 12 Not Taking at Unknown time  . promethazine (PHENERGAN) 12.5 MG tablet Take 1 tablet (12.5 mg total) by mouth every 6 (six) hours as needed for nausea or vomiting. 30 tablet 0 Taking  . terconazole (TERAZOL 3) 0.8 % vaginal cream Place 1 applicator vaginally at bedtime. (Patient not taking: Reported on 04/30/2016) 20 g 0 Completed Course at Unknown time  . Vitamin D, Ergocalciferol, (DRISDOL) 50000  units CAPS capsule Take 1 capsule (50,000 Units total) by mouth every 7 (seven) days. 30 capsule 2 sunday    I have reviewed patient's Past Medical Hx, Surgical Hx, Family Hx, Social Hx, medications and allergies.   ROS:  Review of Systems  Constitutional: Negative for fever.  Gastrointestinal: Positive for abdominal pain. Negative for constipation, diarrhea, nausea and vomiting.  Genitourinary: Negative for dysuria.  Musculoskeletal: Negative for myalgias.   Other systems negative  Physical Exam  Patient Vitals for the past 24 hrs:  BP Temp Temp src Pulse Resp SpO2 Height Weight  04/30/16 1151 107/61 99.4 F (37.4 C)  Oral 78 16 99 % 5\' 7"  (1.702 m) 165 lb (74.8 kg)   Constitutional: Well-developed, well-nourished female in no acute distress.  Cardiovascular: normal rate and rhythm Respiratory: normal effort, clear to auscultation bilaterally GI: Abd soft, non-tender except for mild tenderness on lower uterine segment, gravid appropriate for gestational age.   No rebound or guarding. MS: Extremities nontender, no edema, normal ROM Neurologic: Alert and oriented x 4.  GU: Neg CVAT.  PELVIC EXAM:  deferred   FHT:  Baseline 140 , moderate variability, accelerations present, no decelerations Contractions: occasional irregular irritability . No contractions longer than about 20-30 seconds   Labs: Results for orders placed or performed during the hospital encounter of 04/30/16 (from the past 72 hour(s))  Urinalysis, Routine w reflex microscopic     Status: Abnormal   Collection Time: 04/30/16  1:44 PM  Result Value Ref Range   Color, Urine YELLOW YELLOW   APPearance HAZY (A) CLEAR   Specific Gravity, Urine 1.025 1.005 - 1.030   pH 6.0 5.0 - 8.0   Glucose, UA NEGATIVE NEGATIVE mg/dL   Hgb urine dipstick NEGATIVE NEGATIVE   Bilirubin Urine NEGATIVE NEGATIVE   Ketones, ur NEGATIVE NEGATIVE mg/dL   Protein, ur 30 (A) NEGATIVE mg/dL   Nitrite NEGATIVE NEGATIVE   Leukocytes, UA NEGATIVE NEGATIVE   RBC / HPF 0-5 0 - 5 RBC/hpf   WBC, UA 6-30 0 - 5 WBC/hpf   Bacteria, UA FEW (A) NONE SEEN   Squamous Epithelial / LPF 0-5 (A) NONE SEEN   Mucous PRESENT     O/Positive/-- (10/25 1603)  Imaging:  No results found.  MAU Course/MDM: I have ordered labs and reviewed results.  NST reviewed Advised patient to PO hydrate Will observe for 4 hours  After 4 hours, did not have any regular contractions. Fetal heart rate remained reassuring for this gestational age.  Bedside US done for reassurance,showed an active fetus in transverse lie, normal amniotic fluid with several pockets.  Assessment: 1.  Supervision of high risk pregnancy, antepartum   2.     Mild blunt abdominal trauma 3.     Reassuring fetal heart rate tracing with no significant contractions  Plan: Discharge home Preterm Labor precautions and fetal kick counts Return if pain increases, contractions begin, or FM decreases. Encouraged to return here or to other Urgent Care/ED if she develops worsening of symptoms, increase in pain, fever, or other concerning symptoms.   Follow up in Office for prenatal visits and recheck   Pt stable at time of discharge.  Wynelle Bourgeois CNM, MSN Certified Nurse-Midwife 04/30/2016 12:47 PM

## 2016-04-30 NOTE — MAU Note (Signed)
Pt reports she hit her abd on a big box at work around 10:40, denies bleeding. Reports her abd feels really tight and she is having cramping.

## 2016-04-30 NOTE — Discharge Instructions (Signed)
Blunt Abdominal Trauma Blunt abdominal trauma is a type of injury that involves damage to the abdominal wall or to abdominal organs, such as the liver or spleen. The damage can involve bruising, tearing, or a rupture. This type of injury does not involve a puncture of the skin. Blunt abdominal trauma can range from mild to severe. In some cases it can lead to a severe abdominal inflammation (peritonitis), severe bleeding, and a dangerous drop in blood pressure. What are the causes? This injury is caused by a hard, direct hit to the abdomen. It can happen after:  A motor vehicle accident.  Being kicked or punched in the abdomen.  Falling from a significant height. What increases the risk? This injury is more likely to happen in people who:  Play contact sports.  Work in a job in which falls or injuries are more likely, such as in [employer] representative. What are the signs or symptoms? The main symptom of this condition is pain in the abdomen. Other symptoms depend on the type and location of the injury. They can include:  Abdominal pain that spreads to the the back or shoulder.  Bruising.  Swelling.  Pain when pressing on the abdomen.  Blood in the urine.  Weakness.  Confusion.  Loss of consciousness.  Pale, dusky, cool, or sweaty skin.  Vomiting blood.  Bloody stool or bleeding from the rectum.  Trouble breathing. Symptoms of this injury can develop suddenly or slowly. How is this diagnosed? This injury is diagnosed based on your symptoms and a physical exam. You may also have tests, including:  Blood tests.  Urine tests.  Imaging tests, such as:  A CT scan and ultrasound of your abdomen.  X-rays of your chest and abdomen.  A test in which a tube is used to flush your abdomen with fluid and check for blood (diagnostic peritoneal lavage). How is this treated? Treatment for this injury depends on its type and severity. Treatment options include:  Observation. If the  injury is mild, this may be the only treatment needed.  Support of your blood pressure and breathing.  Getting blood, fluids, or medicine through an IV tube.  Antibiotic medicine.  Insertion of tubes into the stomach or bladder.  A blood transfusion.  A procedure to stop bleeding. This involves putting a long, thin tube (catheter) into one of your blood vessels (angiographic embolization).  Surgery to open up your abdomen and control bleeding or repair damage (laparotomy). This may be done if tests suggest that you have peritonitis or bleeding that cannot be controlled with angiographic embolization. Follow these instructions at home:  Take medicines only as directed by your health care provider.  If you were prescribed an antibiotic medicine, finish all of it even if you start to feel better.  Follow your health care provider's instructions about diet and activity restrictions.  Keep all follow-up visits as directed by your health care provider. This is important. Contact a health care provider if:  You continue to have abdominal pain.  Your symptoms return.  You develop new symptoms.  You have blood in your urine or your bowel movements. Get help right away if:  You vomit blood.  You have heavy bleeding from your rectum.  You have very bad abdominal pain.  You have trouble breathing.  You have chest pain.  You have a fever.  You have dizziness.  You pass out. This information is not intended to replace advice given to you by your health care provider.  Make sure you discuss any questions you have with your health care provider. Document Released: 04/10/2004 Document Revised: 07/12/2015 Document Reviewed: 02/22/2014 Elsevier Interactive Patient Education  2017 [employer].  Second Trimester of Pregnancy The second trimester is from week 13 through week 28, month 4 through 6. This is often the time in pregnancy that you feel your best. Often times, morning  sickness has lessened or quit. You may have more energy, and you may get hungry more often. Your unborn baby (fetus) is growing rapidly. At the end of the sixth month, he or she is about 9 inches long and weighs about 1 pounds. You will likely feel the baby move (quickening) between 18 and 20 weeks of pregnancy. Follow these instructions at home:  Avoid all smoking, herbs, and alcohol. Avoid drugs not approved by your doctor.  Do not use any tobacco products, including cigarettes, chewing tobacco, and electronic cigarettes. If you need help quitting, ask your doctor. You may get counseling or other support to help you quit.  Only take medicine as told by your doctor. Some medicines are safe and some are not during pregnancy.  Exercise only as told by your doctor. Stop exercising if you start having cramps.  Eat regular, healthy meals.  Wear a good support bra if your breasts are tender.  Do not use hot tubs, steam rooms, or saunas.  Wear your seat belt when driving.  Avoid raw meat, uncooked cheese, and liter boxes and soil used by cats.  Take your prenatal vitamins.  Take 1500-2000 milligrams of calcium daily starting at the 20th week of pregnancy until you deliver your baby.  Try taking medicine that helps you poop (stool softener) as needed, and if your doctor approves. Eat more fiber by eating fresh fruit, vegetables, and whole grains. Drink enough fluids to keep your pee (urine) clear or pale yellow.  Take warm water baths (sitz baths) to soothe pain or discomfort caused by hemorrhoids. Use hemorrhoid cream if your doctor approves.  If you have puffy, bulging veins (varicose veins), wear support hose. Raise (elevate) your feet for 15 minutes, 3-4 times a day. Limit salt in your diet.  Avoid heavy lifting, wear low heals, and sit up straight.  Rest with your legs raised if you have leg cramps or low back pain.  Visit your dentist if you have not gone during your pregnancy. Use  a soft toothbrush to brush your teeth. Be gentle when you floss.  You can have sex (intercourse) unless your doctor tells you not to.  Go to your doctor visits. Get help if:  You feel dizzy.  You have mild cramps or pressure in your lower belly (abdomen).  You have a nagging pain in your belly area.  You continue to feel sick to your stomach (nauseous), throw up (vomit), or have watery poop (diarrhea).  You have bad smelling fluid coming from your vagina.  You have pain with peeing (urination). Get help right away if:  You have a fever.  You are leaking fluid from your vagina.  You have spotting or bleeding from your vagina.  You have severe belly cramping or pain.  You lose or gain weight rapidly.  You have trouble catching your breath and have chest pain.  You notice sudden or extreme puffiness (swelling) of your face, hands, ankles, feet, or legs.  You have not felt the baby move in over an hour.  You have severe headaches that do not go away with medicine.  You have vision changes. This information is not intended to replace advice given to you by your health care provider. Make sure you discuss any questions you have with your health care provider. Document Released: 05/28/2009 Document Revised: 08/09/2015 Document Reviewed: 05/04/2012 Elsevier Interactive Patient Education  2017 [employer]

## 2016-04-30 NOTE — Telephone Encounter (Signed)
Returned call and patient stated that she hit her stomach while at work today, and is now feeling "tightening" pains. [facility] to be evaluated, patient agreed.

## 2016-05-02 DIAGNOSIS — Z3483 Encounter for supervision of other normal pregnancy, third trimester: Secondary | ICD-10-CM

## 2016-05-09 ENCOUNTER — Ambulatory Visit (HOSPITAL_COMMUNITY)
Admission: RE | Admit: 2016-05-09 | Discharge: 2016-05-09 | Disposition: A | Discharge status: Home / Self Care | Payer: 59 | Source: Ambulatory Visit | Attending: Obstetrics | Admitting: Obstetrics

## 2016-05-09 ENCOUNTER — Encounter (HOSPITAL_COMMUNITY): Payer: Self-pay

## 2016-05-09 DIAGNOSIS — Z363 Encounter for antenatal screening for malformations: Secondary | ICD-10-CM | POA: Diagnosis not present

## 2016-05-09 DIAGNOSIS — O26872 Cervical shortening, second trimester: Secondary | ICD-10-CM

## 2016-05-09 DIAGNOSIS — Z3A26 26 weeks gestation of pregnancy: Secondary | ICD-10-CM | POA: Diagnosis not present

## 2016-05-09 DIAGNOSIS — O0990 Supervision of high risk pregnancy, unspecified, unspecified trimester: Secondary | ICD-10-CM

## 2016-05-09 MED ORDER — BETAMETHASONE SOD PHOS & ACET 6 (3-3) MG/ML IJ SUSP
12.0000 mg | Freq: Once | INTRAMUSCULAR | Status: AC
  Administered 2016-05-09: 12 mg via INTRAMUSCULAR
  Filled 2016-05-09: qty 2

## 2016-05-09 NOTE — Consult Note (Signed)
Maternal Fetal Medicine Consultation  Requesting Provider(s): Tanna Furry, CNM  Reason for consultation: Shortened cervix, 26w 5d   HPI: JAMAL HASKIN is a 26 yo G3P2002, EDD 08/10/2016 who is currently at 26w 5d seen for consultation due to shortened cervix.  Ms. Staples reports that she also had a shortened cervix in her previous pregnancy that required vaginal progesterone supplements and bedrest, but that she ultimately delivered at term without complications.  She reports that her pregnancy has otherwise been uneventful - had some bleeding due to subchorionic hemorrhage in early pregnancy that has since resolved and was recently admitted following a fall and frequent uterine contractions.  She otherwise denies contractions or symptoms of preterm labor.  The fetus is active and she is without complaints.  OB History: OB History    Gravida Para Term Preterm AB Living   3 1 1   1 1    SAB TAB Ectopic Multiple Live Births     0     1      PMH:  Past Medical History:  Diagnosis Date  . Medical history non-contributory     PSH:  Past Surgical History:  Procedure Laterality Date  . INDUCED ABORTION     Meds:  Current Outpatient Prescriptions on File Prior to Encounter  Medication Sig Dispense Refill  . Prenat-FeCbn-FeAspGl-FA-Omega (OB COMPLETE PETITE) 35-5-1-200 MG CAPS Take 1 tablet by mouth daily. 30 capsule 12  . progesterone (PROMETRIUM) 200 MG capsule Place 1 capsule (200 mg total) vaginally daily. 30 capsule 9  . Vitamin D, Ergocalciferol, (DRISDOL) 50000 units CAPS capsule Take 1 capsule (50,000 Units total) by mouth every 7 (seven) days. 30 capsule 2   No current facility-administered medications on file prior to encounter.    Allergies: No Known Allergies   FH:  Family History  Problem Relation Age of Onset  . Diabetes Mother   . Cancer Maternal Grandmother   . Diabetes Maternal Grandmother   . Cancer Maternal Grandfather    Soc:  Social History    Social History  . Marital status: Single    Spouse name: N/A  . Number of children: N/A  . Years of education: N/A   Occupational History  . Not on file.   Social History Main Topics  . Smoking status: Former Smoker    Types: Cigars  . Smokeless tobacco: Never Used  . Alcohol use No     Comment: Occasionally  . Drug use: No  . Sexual activity: Yes    Birth control/ protection: None   Other Topics Concern  . Not on file   Social History Narrative  . No narrative on file    Review of Systems: no vaginal bleeding or cramping/contractions, no LOF, no nausea/vomiting. All other systems reviewed and are negative.  PE:  165.8 lbs, 108/68, 91  GEN: well-appearing female ABD: gravid, NT  Ultrasound:  Single IUP at 26w 5d Limited views of the fetal profile obtained due to fetal position - appears normal All other anatomy was seen and appears normal The estimated fetal weight is at the 63rd %tile Anterior placenta without previa Normal amniotic fluid volume  TVUS - cervical length 2.2 cm without funneling   A/P: 1) Single IUP at 26w 5d  2) Cervical shortening - Ms. Dallman had a previous term vaginal delivery despite cervical shortening in her last pregnancy.  On today's exam, the cervix measures 2.2 cm without funneling.  She is currently on vaginal progesterone and has had some activity  limitations due to shortened cervix.  She is at increased risk for preterm delivery, and I have elected to administer a course of betamethasone to accelerate fetal lung maturity.  Would not do any other interventions at this time and given her current gestational age, do not feel that she needs continued cervical length ultrasounds - would follow clinically at this point.  Recommendations: 1) Betamethasone series (given first injection today) 2) Continue vaginal progesterone supplementation 3) Preterm labor precautions, modified activity 4) Follow clinically - does not need  additional cervical length ultrasounds given current gestational age.   Thank you for the opportunity to be a part of the care of Josefina S Shortridge. Please contact our office if we can be of further assistance.   I spent approximately 30 minutes with this patient with over 50% of time spent in face-to-face counseling.  Alpha GulaPaul Jyll Tomaro, MD Maternal Fetal Medicine

## 2016-05-12 ENCOUNTER — Ambulatory Visit (INDEPENDENT_AMBULATORY_CARE_PROVIDER_SITE_OTHER): Payer: 59 | Admitting: Certified Nurse Midwife

## 2016-05-12 ENCOUNTER — Other Ambulatory Visit: Payer: 59

## 2016-05-12 VITALS — BP 104/68 | HR 82 | Wt 169.0 lb

## 2016-05-12 DIAGNOSIS — O09899 Supervision of other high risk pregnancies, unspecified trimester: Secondary | ICD-10-CM

## 2016-05-12 DIAGNOSIS — R7989 Other specified abnormal findings of blood chemistry: Secondary | ICD-10-CM

## 2016-05-12 DIAGNOSIS — O0990 Supervision of high risk pregnancy, unspecified, unspecified trimester: Secondary | ICD-10-CM

## 2016-05-12 DIAGNOSIS — E559 Vitamin D deficiency, unspecified: Secondary | ICD-10-CM

## 2016-05-12 DIAGNOSIS — O26872 Cervical shortening, second trimester: Secondary | ICD-10-CM

## 2016-05-12 DIAGNOSIS — O26879 Cervical shortening, unspecified trimester: Secondary | ICD-10-CM

## 2016-05-12 NOTE — Progress Notes (Signed)
   PRENATAL VISIT NOTE  Subjective:  Stephannie Lieisha S Schum is a 26 y.o. G3P1011 at [redacted]w[redacted]d being seen today for ongoing prenatal care.  She is currently monitored for the following issues for this low-risk pregnancy and has ASCUS pap smear but negative high-risk HPV on 10/16/2015.   ; Supervision of high risk pregnancy, antepartum; Prior pregnancy complicated by short cervix, antepartum; Low vitamin D level; and Short cervix affecting pregnancy on her problem list.  Patient reports no complaints.  Contractions: Not present. Vag. Bleeding: None.  Movement: Present. Denies leaking of fluid.   The following portions of the patient's history were reviewed and updated as appropriate: allergies, current medications, past family history, past medical history, past social history, past surgical history and problem list. Problem list updated.  Objective:   Vitals:   05/12/16 0932  BP: 104/68  Pulse: 82  Weight: 169 lb (76.7 kg)    Fetal Status: Fetal Heart Rate (bpm): 144 Fundal Height: 27 cm Movement: Present     General:  Alert, oriented and cooperative. Patient is in no acute distress.  Skin: Skin is warm and dry. No rash noted.   Cardiovascular: Normal heart rate noted  Respiratory: Normal respiratory effort, no problems with respiration noted  Abdomen: Soft, gravid, appropriate for gestational age. Pain/Pressure: Absent     Pelvic:  Cervical exam deferred        Extremities: Normal range of motion.     Mental Status: Normal mood and affect. Normal behavior. Normal judgment and thought content.   Assessment and Plan:  Pregnancy: G3P1011 at 313w1d  1. Prior pregnancy complicated by short cervix, antepartum        2. Low vitamin D level     Taking weekly vitamin D  3. Short cervix affecting pregnancy     On Prometrium nightly; CL stable 2.2cm  4. Supervision of high risk pregnancy, antepartum      Doing well.  FMLA paperwork completed.   Preterm labor symptoms and general obstetric  precautions including but not limited to vaginal bleeding, contractions, leaking of fluid and fetal movement were reviewed in detail with the patient. Please refer to After Visit Summary for other counseling recommendations.  Return in about 2 weeks (around 05/26/2016) for ROB, HOB for CL. Reschedule 2 hr OGTT.   Roe Coombsachelle A Terris Germano, CNM

## 2016-05-15 ENCOUNTER — Other Ambulatory Visit: Payer: 59

## 2016-05-15 DIAGNOSIS — Z3480 Encounter for supervision of other normal pregnancy, unspecified trimester: Secondary | ICD-10-CM

## 2016-05-16 ENCOUNTER — Other Ambulatory Visit: Payer: Self-pay | Admitting: Certified Nurse Midwife

## 2016-05-16 DIAGNOSIS — O24414 Gestational diabetes mellitus in pregnancy, insulin controlled: Secondary | ICD-10-CM | POA: Insufficient documentation

## 2016-05-16 DIAGNOSIS — O24419 Gestational diabetes mellitus in pregnancy, unspecified control: Secondary | ICD-10-CM

## 2016-05-16 LAB — CBC
Hematocrit: 33.9 % — ABNORMAL LOW (ref 34.0–46.6)
Hemoglobin: 11.3 g/dL (ref 11.1–15.9)
MCH: 26.8 pg (ref 26.6–33.0)
MCHC: 33.3 g/dL (ref 31.5–35.7)
MCV: 80 fL (ref 79–97)
PLATELETS: 186 10*3/uL (ref 150–379)
RBC: 4.22 x10E6/uL (ref 3.77–5.28)
RDW: 15.5 % — ABNORMAL HIGH (ref 12.3–15.4)
WBC: 10.3 10*3/uL (ref 3.4–10.8)

## 2016-05-16 LAB — RPR: RPR: NONREACTIVE

## 2016-05-16 LAB — GLUCOSE TOLERANCE, 2 HOURS W/ 1HR
GLUCOSE, FASTING: 88 mg/dL (ref 65–91)
Glucose, 1 hour: 208 mg/dL — ABNORMAL HIGH (ref 65–179)
Glucose, 2 hour: 192 mg/dL — ABNORMAL HIGH (ref 65–152)

## 2016-05-16 LAB — HIV ANTIBODY (ROUTINE TESTING W REFLEX): HIV SCREEN 4TH GENERATION: NONREACTIVE

## 2016-05-19 ENCOUNTER — Telehealth: Payer: Self-pay

## 2016-05-19 DIAGNOSIS — Z3A28 28 weeks gestation of pregnancy: Secondary | ICD-10-CM

## 2016-05-19 MED ORDER — ACCU-CHEK NANO SMARTVIEW W/DEVICE KIT: 1.0000 | PACK | Freq: Four times a day (QID) | 0 refills | Status: DC

## 2016-05-19 MED ORDER — GLUCOSE BLOOD VI STRP: ORAL_STRIP | 12 refills | Status: DC

## 2016-05-19 MED ORDER — ACCU-CHEK FASTCLIX LANCETS MISC: 1.0000 | Freq: Four times a day (QID) | 10 refills | Status: DC

## 2016-05-19 NOTE — Telephone Encounter (Signed)
Attempted to contact about results, no answer, left vm 

## 2016-05-20 ENCOUNTER — Telehealth: Payer: Self-pay

## 2016-05-20 DIAGNOSIS — O0990 Supervision of high risk pregnancy, unspecified, unspecified trimester: Secondary | ICD-10-CM

## 2016-05-20 MED ORDER — GLUCOSE BLOOD VI STRP: ORAL_STRIP | 12 refills | Status: DC

## 2016-05-20 MED ORDER — ONETOUCH ULTRASOFT LANCETS MISC: 12 refills | Status: DC

## 2016-05-20 MED ORDER — ONETOUCH BASIC SYSTEM W/DEVICE KIT: 1.0000 | PACK | Freq: Four times a day (QID) | 0 refills | Status: DC

## 2016-05-20 NOTE — Telephone Encounter (Signed)
Patient spoke to scheduler today and was informed of MFM appt, and supplies sent to pharmacy.

## 2016-05-29 ENCOUNTER — Other Ambulatory Visit: Payer: Self-pay | Admitting: Certified Nurse Midwife

## 2016-05-29 ENCOUNTER — Encounter: Payer: 59 | Attending: Certified Nurse Midwife | Admitting: *Deleted

## 2016-05-29 ENCOUNTER — Ambulatory Visit (INDEPENDENT_AMBULATORY_CARE_PROVIDER_SITE_OTHER): Payer: 59 | Admitting: Certified Nurse Midwife

## 2016-05-29 ENCOUNTER — Encounter (HOSPITAL_COMMUNITY): Payer: Self-pay

## 2016-05-29 ENCOUNTER — Ambulatory Visit (HOSPITAL_COMMUNITY)
Admission: RE | Admit: 2016-05-29 | Discharge: 2016-05-29 | Disposition: A | Discharge status: Home / Self Care | Payer: 59 | Source: Ambulatory Visit | Attending: Certified Nurse Midwife | Admitting: Certified Nurse Midwife

## 2016-05-29 ENCOUNTER — Encounter: Payer: Self-pay | Admitting: Certified Nurse Midwife

## 2016-05-29 VITALS — BP 111/73 | HR 88 | Temp 97.7°F | Wt 167.6 lb

## 2016-05-29 DIAGNOSIS — O0990 Supervision of high risk pregnancy, unspecified, unspecified trimester: Secondary | ICD-10-CM

## 2016-05-29 DIAGNOSIS — Z3A29 29 weeks gestation of pregnancy: Secondary | ICD-10-CM | POA: Insufficient documentation

## 2016-05-29 DIAGNOSIS — Z3A00 Weeks of gestation of pregnancy not specified: Secondary | ICD-10-CM | POA: Diagnosis not present

## 2016-05-29 DIAGNOSIS — O26879 Cervical shortening, unspecified trimester: Secondary | ICD-10-CM

## 2016-05-29 DIAGNOSIS — O24410 Gestational diabetes mellitus in pregnancy, diet controlled: Secondary | ICD-10-CM

## 2016-05-29 DIAGNOSIS — Z23 Encounter for immunization: Secondary | ICD-10-CM

## 2016-05-29 DIAGNOSIS — O09899 Supervision of other high risk pregnancies, unspecified trimester: Secondary | ICD-10-CM

## 2016-05-29 DIAGNOSIS — R7309 Other abnormal glucose: Secondary | ICD-10-CM

## 2016-05-29 DIAGNOSIS — Z713 Dietary counseling and surveillance: Secondary | ICD-10-CM | POA: Insufficient documentation

## 2016-05-29 DIAGNOSIS — O26872 Cervical shortening, second trimester: Secondary | ICD-10-CM

## 2016-05-29 DIAGNOSIS — O24419 Gestational diabetes mellitus in pregnancy, unspecified control: Secondary | ICD-10-CM | POA: Diagnosis present

## 2016-05-29 DIAGNOSIS — R7989 Other specified abnormal findings of blood chemistry: Secondary | ICD-10-CM

## 2016-05-29 MED ORDER — BETAMETHASONE SOD PHOS & ACET 6 (3-3) MG/ML IJ SUSP
12.0000 mg | Freq: Once | INTRAMUSCULAR | Status: AC
  Administered 2016-05-29: 12 mg via INTRAMUSCULAR

## 2016-05-29 NOTE — Progress Notes (Signed)
  Patient was seen on 05/29/2016 for Gestational Diabetes self-management. She is here with her husband who appears supportive. She states he cooks the supper meal and they are limiting high fat meats, including more lean meats, vegetables and whole grain starches. She has picked up her One Touch Meter but does not know how to use it yet.  The following learning objectives were met by the patient :   States the definition of Gestational Diabetes  States why dietary management is important in controlling blood glucose  Describes the effects of carbohydrates on blood glucose levels  Demonstrates ability to create a balanced meal plan  Demonstrates carbohydrate counting   States when to check blood glucose levels  Demonstrates proper blood glucose monitoring techniques  States the effect of stress and exercise on blood glucose levels  States the importance of limiting caffeine and abstaining from alcohol and smoking  Plan:  Aim for 3 Carb Choices per meal (45 grams) +/- 1 either way  Aim for 1-2 Carbs per snack Begin reading food labels for Total Carbohydrate of foods Consider  increasing your activity level by walking or other activity daily as tolerated Begin checking BG before breakfast and 2 hours after first bite of breakfast, lunch and dinner as directed by MD  Take medication if directed by MD  Patient already has a meter: One Touch Patient instructed to test pre breakfast and 2 hours each meal as directed by MD Bring Log Book to every medical appointment   Patient instructed to monitor glucose levels: FBS: 60 - <90 2 hour: <120  Patient received the following handouts:  Nutrition Diabetes and Pregnancy  Carbohydrate Counting List  Patient will be seen for follow-up as needed.

## 2016-05-29 NOTE — Progress Notes (Signed)
   PRENATAL VISIT NOTE  Subjective:  Kelsey Jones is a 26 y.o. G3P1011 at 4947w4d being seen today for ongoing prenatal care.  She is currently monitored for the following issues for this high-risk pregnancy and has ASCUS pap smear but negative high-risk HPV on 10/16/2015.   ; Supervision of high risk pregnancy, antepartum; Prior pregnancy complicated by short cervix, antepartum; Low vitamin D level; Short cervix affecting pregnancy; and Gestational diabetes mellitus (GDM) in second trimester on her problem list.  Patient reports no complaints.  Contractions: Irregular. Vag. Bleeding: None.  Movement: Present. Denies leaking of fluid.   The following portions of the patient's history were reviewed and updated as appropriate: allergies, current medications, past family history, past medical history, past social history, past surgical history and problem list. Problem list updated.  Objective:   Vitals:   05/29/16 0839  BP: 111/73  Pulse: 88  Temp: 97.7 F (36.5 C)  Weight: 167 lb 9.6 oz (76 kg)    Fetal Status: Fetal Heart Rate (bpm): 147 Fundal Height: 29 cm Movement: Present     General:  Alert, oriented and cooperative. Patient is in no acute distress.  Skin: Skin is warm and dry. No rash noted.   Cardiovascular: Normal heart rate noted  Respiratory: Normal respiratory effort, no problems with respiration noted  Abdomen: Soft, gravid, appropriate for gestational age. Pain/Pressure: Absent     Pelvic:  Cervical exam deferred        Extremities: Normal range of motion.  Edema: Trace  Mental Status: Normal mood and affect. Normal behavior. Normal judgment and thought content.   Assessment and Plan:  Pregnancy: G3P1011 at 3347w4d  1. Supervision of high risk pregnancy, antepartum      - Ambulatory referral to Neonatology - Tdap vaccine greater than or equal to 7yo IM - betamethasone acetate-betamethasone sodium phosphate (CELESTONE) injection 12 mg; Inject 2 mLs (12 mg total)  into the muscle once.  2. Diet controlled gestational diabetes mellitus (GDM) in second trimester     Has nutrition scheduled.  F/U US today.  - Ambulatory referral to Neonatology  3. Prior pregnancy complicated by short cervix, antepartum     Irregular use of prometrium noted.  Educated needs to use daily  4. Low vitamin D level     Taking weekly vitamin D.   5. Short cervix affecting pregnancy      - Ambulatory referral to Neonatology  Preterm labor symptoms and general obstetric precautions including but not limited to vaginal bleeding, contractions, leaking of fluid and fetal movement were reviewed in detail with the patient. Please refer to After Visit Summary for other counseling recommendations.  Return in about 2 weeks (around 06/12/2016) [facility]   Roe Coombsachelle A Zeva Leber, CNM

## 2016-05-29 NOTE — Progress Notes (Signed)
Patient states that she feels good, reports good fetal movement. Administered betamethasone and patient tolerated well .Marland Kitchen. Administrations This Visit    betamethasone acetate-betamethasone sodium phosphate (CELESTONE) injection 12 mg    Admin Date 05/29/2016 Action Given Dose 12 mg Route Intramuscular Administered By Katrina StackBrittany D Breelynn Bankert, RN

## 2016-05-30 ENCOUNTER — Telehealth: Payer: Self-pay | Admitting: *Deleted

## 2016-05-30 NOTE — Telephone Encounter (Signed)
Patient is calling for a refill on her Vitamin D. Call to patient- she never picked up her Rx and she states it is not there. Call to pharmacy- it is in her file and they will fill it for her. Patient notified.

## 2016-06-02 ENCOUNTER — Telehealth: Payer: Self-pay | Admitting: *Deleted

## 2016-06-02 ENCOUNTER — Other Ambulatory Visit: Payer: Self-pay | Admitting: Obstetrics and Gynecology

## 2016-06-02 MED ORDER — GLYBURIDE 2.5 MG PO TABS: 2.5000 mg | ORAL_TABLET | Freq: Two times a day (BID) | ORAL | 3 refills | Status: DC

## 2016-06-02 NOTE — Telephone Encounter (Signed)
Please inform patient that GDM is a result of genetic predisposition and the secretion of a hormone from the placenta. Despite her best efforts, diet alone is not sufficient to treat diabetes. Her values are high enough that I have prescribed her glyburide to take with breakfast and at bedtime. She needs to continue with her carb modified diet while taking this medication. We will see her on 3/28 and review her levels at that time. If necessary changes in the dosing of the medication will be made at that time.  In the meantime, she should continue with diet, new prescription and add daily physical activity- at a minimum 30 minutes of walking daily. Together we will work on getting her diabetes well controlled

## 2016-06-02 NOTE — Telephone Encounter (Signed)
Patient is concerned about her glucose levels. She has been to her nutrition counciling and she is monitoring. Her levels are as follows:  3/16 [phone] 3/17 135,112,138,202 3/18 did not check 3/19 135 Patient is very upset that she can not get her levels lower with her diet. Told patient I would check with Dr Jolayne Pantheronstant and call her back.

## 2016-06-02 NOTE — Telephone Encounter (Signed)
Patient notified

## 2016-06-02 NOTE — Telephone Encounter (Signed)
Patient called to ask what to do since her lancets don't fit her lancing device. I suggested she go to her her pharmacy ([facility] [address]) with her lancing device to get the correct lancets. I also explained she could prick her finger without the lancing device if she was comfortable doing so.  I asked how her BG's were doing and all reported BG's were above 150. As I was directing her to call her OB Dr. with those numbers, we were disconnected. When I called her phone back I was sent to voicemail, which I recorded that she should notify her OB of these elevated numbers.

## 2016-06-09 ENCOUNTER — Telehealth: Payer: Self-pay | Admitting: *Deleted

## 2016-06-09 NOTE — Telephone Encounter (Signed)
Patient called my cell # to report BG of 200. She states her FBG this AM was 113 mg/dl and 2 hours after breakfast her BG was 200 and she didn't know what to do. I instructed her to drink water and to walk as tolerated. I reminded her of her meal plan of limiting her carb choices to 2-3 per meal (30-45 grams). She states she is taking her Glyburide after breakfast and supper meals as prescribed.  I instructed her to [facility].   She states she had an appointment scheduled this afternoon, 06/09/2016 but she had rescheduled for next week, 06/18/2016, due to work commitment. I encouraged her to come sooner.

## 2016-06-10 ENCOUNTER — Telehealth: Payer: Self-pay | Admitting: *Deleted

## 2016-06-10 ENCOUNTER — Other Ambulatory Visit: Payer: Self-pay | Admitting: *Deleted

## 2016-06-10 DIAGNOSIS — O24419 Gestational diabetes mellitus in pregnancy, unspecified control: Secondary | ICD-10-CM

## 2016-06-10 MED ORDER — GLUCOSE BLOOD VI STRP: ORAL_STRIP | 99 refills | Status: DC

## 2016-06-10 NOTE — Telephone Encounter (Signed)
Patient should keep her follow up appointment. No change in current management will be made without talking to the patient directly. I need to know what she is eating (documenting her intakes next to her values is helpful). I need to know how and when she is taking her medication.  She should continue to adhere to diabetic diet, take medications as prescribed and incorporate at least 30 minutes of daily exercise

## 2016-06-10 NOTE — Telephone Encounter (Signed)
Call to patient- left message on voice mail with instructions and encouragement.

## 2016-06-10 NOTE — Telephone Encounter (Signed)
Patient called about her refill on her test strips. When I called her back she was extremely distressed about her glucose levels. She doesn't feel she can get in to see a provider when she needs and the front staff will not schedule her. She was crying so I discussed her diet, her medications. She is distressed because her average fasting are 108,127 and her after meals are 220,147,194,288,257. I discussed that she may need a change in medication or dosage and I would send a message to her provider. I told her that her job is to keep very good records of her diet and her levels so appropriate adjustments can be made and not to beat herself up. She is better. Her next appointment is next week and I told her I would check with her provider to see if she wanted to wait to see her or if she needed an adjustment now.

## 2016-06-11 ENCOUNTER — Encounter: Payer: 59 | Admitting: Obstetrics and Gynecology

## 2016-06-14 ENCOUNTER — Encounter (HOSPITAL_COMMUNITY): Payer: Self-pay | Admitting: *Deleted

## 2016-06-14 ENCOUNTER — Inpatient Hospital Stay (HOSPITAL_COMMUNITY)
Admission: AD | Admit: 2016-06-14 | Discharge: 2016-06-14 | Disposition: A | Discharge status: Home / Self Care | Payer: 59 | Source: Ambulatory Visit | Attending: Obstetrics & Gynecology | Admitting: Obstetrics & Gynecology

## 2016-06-14 DIAGNOSIS — O26893 Other specified pregnancy related conditions, third trimester: Secondary | ICD-10-CM

## 2016-06-14 DIAGNOSIS — Z3A31 31 weeks gestation of pregnancy: Secondary | ICD-10-CM | POA: Insufficient documentation

## 2016-06-14 DIAGNOSIS — R109 Unspecified abdominal pain: Secondary | ICD-10-CM | POA: Diagnosis not present

## 2016-06-14 DIAGNOSIS — O09893 Supervision of other high risk pregnancies, third trimester: Secondary | ICD-10-CM | POA: Diagnosis not present

## 2016-06-14 DIAGNOSIS — O24415 Gestational diabetes mellitus in pregnancy, controlled by oral hypoglycemic drugs: Secondary | ICD-10-CM | POA: Diagnosis not present

## 2016-06-14 DIAGNOSIS — Z87891 Personal history of nicotine dependence: Secondary | ICD-10-CM | POA: Insufficient documentation

## 2016-06-14 DIAGNOSIS — O0990 Supervision of high risk pregnancy, unspecified, unspecified trimester: Secondary | ICD-10-CM

## 2016-06-14 NOTE — MAU Provider Note (Signed)
History   G3P1011 @ 31 =No flowsheet data found.   wks in with c/o being at work and having sharp shooting pains and pelvic pressure. Pt states she has short cervix but went to full term with her first child. States had BMX on 05/29/16. GDM on glyburide 2.5 BID. Denies vag bleeding or ROM  CSN: [id number]  Arrival date & time 06/14/16  1957   None     Chief Complaint  Patient presents with  . Contractions    HPI  Past Medical History:  Diagnosis Date  . Medical history non-contributory     Past Surgical History:  Procedure Laterality Date  . INDUCED ABORTION      Family History  Problem Relation Age of Onset  . Diabetes Mother   . Cancer Maternal Grandmother   . Diabetes Maternal Grandmother   . Cancer Maternal Grandfather     Social History  Substance Use Topics  . Smoking status: Former Smoker    Types: Cigars  . Smokeless tobacco: Never Used  . Alcohol use No     Comment: Occasionally    OB History    Gravida Para Term Preterm AB Living   SAB TAB Ectopic Multiple Live Births     0     1      Review of Systems  Constitutional: Negative.   HENT: Negative.   Eyes: Negative.   Respiratory: Negative.   Cardiovascular: Negative.   Gastrointestinal: Positive for abdominal pain.  Genitourinary: Negative.   Musculoskeletal: Negative.   Skin: Negative.   Neurological: Negative.   Hematological: Negative.   Psychiatric/Behavioral: Negative.     Allergies  Patient has no known allergies.  Home Medications    BP 118/67 (BP Location: Right Arm)   Pulse 96   Temp 98.1 F (36.7 C)   Resp 18   Ht  (1.702 m)   Wt 170 lb (77.1 kg)   LMP 11/04/2015 (Approximate)   BMI 26.63 kg/m   Physical Exam  Constitutional: She is oriented to person, place, and time. She appears well-developed and well-nourished.  HENT:  Head: Normocephalic.  Eyes: Pupils are equal, round, and reactive to light.  Neck: Normal range of motion.   Cardiovascular: Normal rate, regular rhythm, normal heart sounds and intact distal pulses.   Pulmonary/Chest: Effort normal and breath sounds normal.  Abdominal: Soft. Bowel sounds are normal.  Genitourinary: Vagina normal and uterus normal.  Musculoskeletal: Normal range of motion.  Neurological: She is alert and oriented to person, place, and time. She has normal reflexes.  Skin: Skin is warm and dry.  Psychiatric: She has a normal mood and affect. Her behavior is normal. Thought content normal.    MAU Course  Procedures (including critical care time)  Labs Reviewed  URINALYSIS, ROUTINE W REFLEX MICROSCOPIC   No results found.   1. Supervision of high risk pregnancy, antepartum       MDM  SVE dimple/50/-3. FHR pattern reassuring. No uc's will d/c home not in labor

## 2016-06-14 NOTE — Discharge Instructions (Signed)
Abdominal Pain During Pregnancy °Belly (abdominal) pain is common during pregnancy. Most of the time, it is not a serious problem. Other times, it can be a sign that something is wrong with the pregnancy. Always tell your doctor if you have belly pain. °Follow these instructions at home: °Monitor your belly pain for any changes. The following actions may help you feel better: °· Do not have sex (intercourse) or put anything in your vagina until you feel better. °· Rest until your pain stops. °· Drink clear fluids if you feel sick to your stomach (nauseous). Do not eat solid food until you feel better. °· Only take medicine as told by your doctor. °· Keep all doctor visits as told. °Get help right away if: °· You are bleeding, leaking fluid, or pieces of tissue come out of your vagina. °· You have more pain or cramping. °· You keep throwing up (vomiting). °· You have pain when you pee (urinate) or have blood in your pee. °· You have a fever. °· You do not feel your baby moving as much. °· You feel very weak or feel like passing out. °· You have trouble breathing, with or without belly pain. °· You have a very bad headache and belly pain. °· You have fluid leaking from your vagina and belly pain. °· You keep having watery poop (diarrhea). °· Your belly pain does not go away after resting, or the pain gets worse. °This information is not intended to replace advice given to you by your health care provider. Make sure you discuss any questions you have with your health care provider. °Document Released: 02/19/2009 Document Revised: 10/10/2015 Document Reviewed: 09/30/2012 °Elsevier Interactive Patient Education © 2017 Elsevier Inc. ° °

## 2016-06-14 NOTE — MAU Note (Signed)
Ctxs since 1830. Was at work. Denies LOF or bleeding. Was dx with GDM couple wks ago and hx short cervix. First delivery term

## 2016-06-18 ENCOUNTER — Telehealth: Payer: Self-pay

## 2016-06-18 ENCOUNTER — Ambulatory Visit (INDEPENDENT_AMBULATORY_CARE_PROVIDER_SITE_OTHER): Payer: 59 | Admitting: Obstetrics & Gynecology

## 2016-06-18 VITALS — BP 106/70 | HR 85 | Wt 170.9 lb

## 2016-06-18 DIAGNOSIS — O24415 Gestational diabetes mellitus in pregnancy, controlled by oral hypoglycemic drugs: Secondary | ICD-10-CM | POA: Diagnosis not present

## 2016-06-18 DIAGNOSIS — O0990 Supervision of high risk pregnancy, unspecified, unspecified trimester: Secondary | ICD-10-CM

## 2016-06-18 MED ORDER — GLYBURIDE 2.5 MG PO TABS: ORAL_TABLET | ORAL | 3 refills | Status: DC

## 2016-06-18 NOTE — Patient Instructions (Signed)
Gestational Diabetes Mellitus, Diagnosis Gestational diabetes (gestational diabetes mellitus) is a short-term (temporary) form of diabetes that can happen during pregnancy. It goes away after you give birth. It may be caused by one or both of these problems:  Your body does not make enough of a hormone called insulin.  Your body does not respond in a normal way to insulin that it makes. Insulin lets sugars (glucose) go into cells in the body. This gives you energy. If you have diabetes, sugars cannot get into cells. This causes high blood sugar (hyperglycemia). If diabetes is treated, it may not hurt you or your baby. Your doctor will set treatment goals for you. In general, you should have these blood sugar levels:  After not eating for a long time (fasting): 95 mg/dL (5.3 mmol/L).  After meals (postprandial):  One hour after a meal: at or below 140 mg/dL (7.8 mmol/L).  Two hours after a meal: at or below 120 mg/dL (6.7 mmol/L).  A1c (hemoglobin A1c) level: 6-6.5%. Follow these instructions at home: Questions to Ask Your Doctor  You may want to ask these questions:  Do I need to meet with a diabetes educator?  Where can I find a support group for people with diabetes?  What equipment will I need to care for myself at home?  What diabetes medicines do I need? When should I take them?  How often do I need to check my blood sugar?  What number can I call if I have questions?  When is my next doctor's visit? General instructions  Take over-the-counter and prescription medicines only as told by your doctor.  Stay at a healthy weight during pregnancy.  Keep all follow-up visits as told by your doctor. This is important. Contact a doctor if:  Your blood sugar is at or above 240 mg/dL (13.3 mmol/L).  Your blood sugar is at or above 200 mg/dL (11.1 mmol/L) and you have ketones in your pee (urine).  You have been sick or have had a fever for 2 days or more and you are not  getting better.  You have any of these problems for more than 6 hours:  You cannot eat or drink.  You feel sick to your stomach (nauseous).  You throw up (vomit).  You have watery poop (diarrhea). Get help right away if:  Your blood sugar is lower than 54 mg/dL (3 mmol/L).  You get confused.  You have trouble:  Thinking clearly.  Breathing.  Your baby moves less than normal.  You have:  Moderate or large ketone levels in your pee (urine).  Bleeding from your vagina.  Unusual fluid coming from your vagina.  Early contractions. These may feel like tightness in your belly. This information is not intended to replace advice given to you by your health care provider. Make sure you discuss any questions you have with your health care provider. Document Released: 06/25/2015 Document Revised: 08/09/2015 Document Reviewed: 04/06/2015 Elsevier Interactive Patient Education  2017 Elsevier Inc.  

## 2016-06-18 NOTE — Progress Notes (Signed)
   PRENATAL VISIT NOTE  Subjective:  Kelsey Jones is a 26 y.o. G3P1011 at [redacted]w[redacted]d being seen today for ongoing prenatal care.  She is currently monitored for the following issues for this high-risk pregnancy and has ASCUS pap smear but negative high-risk HPV on 10/16/2015.   ; Supervision of high risk pregnancy, antepartum; Prior pregnancy complicated by short cervix, antepartum; Low vitamin D level; Short cervix affecting pregnancy; and Gestational diabetes mellitus (GDM) in second trimester on her problem list.  Patient reports no complaints.  Contractions: Irregular. Vag. Bleeding: None.  Movement: Present. Denies leaking of fluid.   The following portions of the patient's history were reviewed and updated as appropriate: allergies, current medications, past family history, past medical history, past social history, past surgical history and problem list. Problem list updated.  Objective:   Vitals:   06/18/16 1458  BP: 106/70  Pulse: 85  Weight: 170 lb 14.4 oz (77.5 kg)    Fetal Status: Fetal Heart Rate (bpm): 152   Movement: Present     General:  Alert, oriented and cooperative. Patient is in no acute distress.  Skin: Skin is warm and dry. No rash noted.   Cardiovascular: Normal heart rate noted  Respiratory: Normal respiratory effort, no problems with respiration noted  Abdomen: Soft, gravid, appropriate for gestational age. Pain/Pressure: Absent     Pelvic:  Cervical exam deferred        Extremities: Normal range of motion.  Edema: Trace  Mental Status: Normal mood and affect. Normal behavior. Normal judgment and thought content.   Assessment and Plan:  Pregnancy: G3P1011 at [redacted]w[redacted]d  1. Supervision of high risk pregnancy, antepartum FBS <80% in range and PP up to 150 ->200, will increase her glyburide - glyBURIDE (DIABETA) 2.5 MG tablet; Two tablets by mouth in the morning and one at night  Dispense: 90 tablet; Refill: 3 - [place] MFM OB FOLLOW UP; Future - [place] MFM FETAL BPP WO  NON STRESS; Future  2. Gestational diabetes mellitus (GDM) in second trimester controlled on oral hypoglycemic drug BPP, growth [place], NST  Preterm labor symptoms and general obstetric precautions including but not limited to vaginal bleeding, contractions, leaking of fluid and fetal movement were reviewed in detail with the patient. Please refer to After Visit Summary for other counseling recommendations 1 week f/u   Adam Phenix, MD

## 2016-06-18 NOTE — Telephone Encounter (Signed)
error 

## 2016-06-18 NOTE — Progress Notes (Signed)
Patient reports good fetal  movement, and contractions that come and go. 

## 2016-06-25 ENCOUNTER — Ambulatory Visit (HOSPITAL_COMMUNITY)
Admission: RE | Admit: 2016-06-25 | Discharge: 2016-06-25 | Disposition: A | Discharge status: Home / Self Care | Payer: 59 | Source: Ambulatory Visit | Attending: Obstetrics & Gynecology | Admitting: Obstetrics & Gynecology

## 2016-06-25 ENCOUNTER — Encounter (HOSPITAL_COMMUNITY): Payer: Self-pay

## 2016-06-25 ENCOUNTER — Telehealth: Payer: Self-pay | Admitting: *Deleted

## 2016-06-25 ENCOUNTER — Other Ambulatory Visit: Payer: Self-pay | Admitting: Obstetrics & Gynecology

## 2016-06-25 DIAGNOSIS — O24415 Gestational diabetes mellitus in pregnancy, controlled by oral hypoglycemic drugs: Secondary | ICD-10-CM | POA: Insufficient documentation

## 2016-06-25 DIAGNOSIS — O24410 Gestational diabetes mellitus in pregnancy, diet controlled: Secondary | ICD-10-CM

## 2016-06-25 DIAGNOSIS — Z3A33 33 weeks gestation of pregnancy: Secondary | ICD-10-CM

## 2016-06-25 DIAGNOSIS — O0990 Supervision of high risk pregnancy, unspecified, unspecified trimester: Secondary | ICD-10-CM

## 2016-06-25 NOTE — Telephone Encounter (Signed)
Pt called to office stating she was expecting a return call from a nurse.  Attempt to contact pt regarding call. LM on VM that she may call back.

## 2016-06-26 ENCOUNTER — Other Ambulatory Visit (HOSPITAL_COMMUNITY): Payer: Self-pay | Admitting: *Deleted

## 2016-06-26 ENCOUNTER — Ambulatory Visit (INDEPENDENT_AMBULATORY_CARE_PROVIDER_SITE_OTHER): Payer: 59 | Admitting: Obstetrics & Gynecology

## 2016-06-26 VITALS — BP 117/84 | HR 96 | Wt 170.5 lb

## 2016-06-26 DIAGNOSIS — O24414 Gestational diabetes mellitus in pregnancy, insulin controlled: Secondary | ICD-10-CM

## 2016-06-26 DIAGNOSIS — O24415 Gestational diabetes mellitus in pregnancy, controlled by oral hypoglycemic drugs: Secondary | ICD-10-CM

## 2016-06-26 DIAGNOSIS — O2243 Hemorrhoids in pregnancy, third trimester: Secondary | ICD-10-CM

## 2016-06-26 DIAGNOSIS — O0990 Supervision of high risk pregnancy, unspecified, unspecified trimester: Secondary | ICD-10-CM

## 2016-06-26 MED ORDER — GLYBURIDE 5 MG PO TABS: ORAL_TABLET | ORAL | 3 refills | Status: DC

## 2016-06-26 MED ORDER — DOCUSATE SODIUM 100 MG PO CAPS: 100.0000 mg | ORAL_CAPSULE | Freq: Two times a day (BID) | ORAL | 2 refills | Status: DC | PRN

## 2016-06-26 MED ORDER — HYDROCORTISONE ACETATE 25 MG RE SUPP: 25.0000 mg | Freq: Two times a day (BID) | RECTAL | 1 refills | Status: DC

## 2016-06-26 NOTE — Patient Instructions (Addendum)
Return to clinic for any scheduled appointments or obstetric concerns, or go to MAU for evaluation  Hemorrhoids Hemorrhoids are swollen veins in and around the rectum or anus. There are two types of hemorrhoids:  Internal hemorrhoids. These occur in the veins that are just inside the rectum. They may poke through to the outside and become irritated and painful.  External hemorrhoids. These occur in the veins that are outside of the anus and can be felt as a painful swelling or hard lump near the anus. Most hemorrhoids do not cause serious problems, and they can be managed with home treatments such as diet and lifestyle changes. If home treatments do not help your symptoms, procedures can be done to shrink or remove the hemorrhoids. What are the causes? This condition is caused by increased pressure in the anal area. This pressure may result from various things, including:  Constipation.  Straining to have a bowel movement.  Diarrhea.  Pregnancy.  Obesity.  Sitting for long periods of time.  Heavy lifting or other activity that causes you to strain.  Anal sex. What are the signs or symptoms? Symptoms of this condition include:  Pain.  Anal itching or irritation.  Rectal bleeding.  Leakage of stool (feces).  Anal swelling.  One or more lumps around the anus. How is this diagnosed? This condition can often be diagnosed through a visual exam. Other exams or tests may also be done, such as:  Examination of the rectal area with a gloved hand (digital rectal exam).  Examination of the anal canal using a small tube (anoscope).  A blood test, if you have lost a significant amount of blood.  A test to look inside the colon (sigmoidoscopy or colonoscopy). How is this treated? This condition can usually be treated at home. However, various procedures may be done if dietary changes, lifestyle changes, and other home treatments do not help your symptoms. These procedures can  help make the hemorrhoids smaller or remove them completely. Some of these procedures involve surgery, and others do not. Common procedures include:  Rubber band ligation. Rubber bands are placed at the base of the hemorrhoids to cut off the blood supply to them.  Sclerotherapy. Medicine is injected into the hemorrhoids to shrink them.  Infrared coagulation. A type of light energy is used to get rid of the hemorrhoids.  Hemorrhoidectomy surgery. The hemorrhoids are surgically removed, and the veins that supply them are tied off.  Stapled hemorrhoidopexy surgery. A circular stapling device is used to remove the hemorrhoids and use staples to cut off the blood supply to them. Follow these instructions at home: Eating and drinking   Eat foods that have a lot of fiber in them, such as whole grains, beans, nuts, fruits, and vegetables. Ask your health care provider about taking products that have added fiber (fiber supplements).  Drink enough fluid to keep your urine clear or pale yellow. Managing pain and swelling   Take warm sitz baths for 20 minutes, 3-4 times a day to ease pain and discomfort.  If directed, apply ice to the affected area. Using ice packs between sitz baths may be helpful.  Put ice in a plastic bag.  Place a towel between your skin and the bag.  Leave the ice on for 20 minutes, 2-3 times a day. General instructions   Take over-the-counter and prescription medicines only as told by your health care provider.  Use medicated creams or suppositories as told.  Exercise regularly.  Go to  the bathroom when you have the urge to have a bowel movement. Do not wait.  Avoid straining to have bowel movements.  Keep the anal area dry and clean. Use wet toilet paper or moist towelettes after a bowel movement.  Do not sit on the toilet for long periods of time. This increases blood pooling and pain. Contact a health care provider if:  You have increasing pain and swelling  that are not controlled by treatment or medicine.  You have uncontrolled bleeding.  You have difficulty having a bowel movement, or you are unable to have a bowel movement.  You have pain or inflammation outside the area of the hemorrhoids. This information is not intended to replace advice given to you by your health care provider. Make sure you discuss any questions you have with your health care provider. Document Released: 02/29/2000 Document Revised: 08/01/2015 Document Reviewed: 11/15/2014 Elsevier Interactive Patient Education  2017 [employer].

## 2016-06-26 NOTE — Progress Notes (Signed)
PRENATAL VISIT NOTE  Subjective:  Kelsey Jones is a 26 y.o. G3P1011 at [redacted]w[redacted]d being seen today for ongoing prenatal care.  She is currently monitored for the following issues for this high-risk pregnancy and has ASCUS pap smear but negative high-risk HPV on 10/16/2015.   ; Supervision of high risk pregnancy, antepartum; Prior pregnancy complicated by short cervix, antepartum; Low vitamin D level; Short cervix affecting pregnancy; and Medication controlled gestational diabetes mellitus in third trimester on her problem list.  Patient reports painful hemorrhoids. No bleeding.  Contractions: Irregular. Vag. Bleeding: None.  Movement: Present. Denies leaking of fluid.   The following portions of the patient's history were reviewed and updated as appropriate: allergies, current medications, past family history, past medical history, past social history, past surgical history and problem list. Problem list updated.  Objective:   Vitals:   06/26/16 1317  BP: 117/84  Pulse: 96  Weight: 170 lb 8 oz (77.3 kg)    Fetal Status: Fetal Heart Rate (bpm): 156 Fundal Height: 35 cm Movement: Present     General:  Alert, oriented and cooperative. Patient is in no acute distress.  Skin: Skin is warm and dry. No rash noted.   Cardiovascular: Normal heart rate noted  Respiratory: Normal respiratory effort, no problems with respiration noted  Abdomen: Soft, gravid, appropriate for gestational age. Pain/Pressure: Present     Pelvic:  Cervical exam deferred       Small external hemorrhoid noted, no bleeding, no erythema, no thrombosis.  Extremities: Normal range of motion.  Edema: Trace  Mental Status: Normal mood and affect. Normal behavior. Normal judgment and thought content.   CBGs: Fastings 100s, PP 130s-140s  [place] Mfm Fetal Bpp Wo Non Stress  Result Date: 06/25/2016 ----------------------------------------------------------------------  OBSTETRICS REPORT                      (Signed Final  06/25/2016 02:03 pm) ---------------------------------------------------------------------- Patient Info  ID #:       [id number]                         D.O.B.:   Jan 23, 1991 (25 yrs)  Name:       Kelsey Jones            Visit Date:  06/25/2016 01:46 pm ---------------------------------------------------------------------- Performed By  Performed By:     Marcellina Millin          Secondary Phy.:   [facility]                                                             [facility]  [facility]                                                             Haskel Khan)  Attending:        Candis Shine        Address:          [address]                    MD                                                             Road                                                             [address]                                                             [place]                                                             [address]  Referred By:      Bing Neighbors              Location:         [facility] MD  Ref. Address:     [address]                    [place], [place]                    [address] ---------------------------------------------------------------------- Orders   #  Description                                 Code   1  [place] MFM OB FOLLOW UP                         86578.46   2  [place] MFM FETAL BPP WO NON STRESS              76819.01  ----------------------------------------------------------------------   #  Ordered By  Order #        Accession #    Episode #   1  Scheryl Darter             [id number]      [id number]     [id number]   2  Scheryl Darter             [id number]      [id number]     [id number]  ---------------------------------------------------------------------- Indications   [redacted] weeks  gestation of pregnancy                Z3A.33   Gestational diabetes in pregnancy,             O24.415   controlled by oral hypoglycemic drugs  ---------------------------------------------------------------------- OB History  Blood Type:            Height:  5'7"   Weight (lb):  165      BMI:   25.84  Gravidity:    3         Term:   1        Prem:   0        SAB:   1  TOP:          0       Ectopic:  0        Living: 1 ---------------------------------------------------------------------- Fetal Evaluation  Num Of Fetuses:     1  Fetal Heart         144  Rate(bpm):  Cardiac Activity:   Observed  Presentation:       Cephalic  Placenta:           Anterior, above cervical os  P. Cord Insertion:  Visualized  Amniotic Fluid  AFI FV:      Subjectively within normal limits  AFI Sum(cm)     %Tile       Largest Pocket(cm)  14.4            51          4.98  RUQ(cm)       RLQ(cm)       LUQ(cm)        LLQ(cm)  2.31          3.12          4.98           3.99 ---------------------------------------------------------------------- Biophysical Evaluation  Amniotic F.V:   Within normal limits       F. Tone:        Observed  F. Movement:    Observed                   Score:          8/8  F. Breathing:   Observed ---------------------------------------------------------------------- Biometry  BPD:      86.8  mm     G. Age:  35w 0d         87  %    CI:        74.05   %   70 - 86                                                          FL/HC:      22.5   %  19.9 - 21.5  HC:      320.3  mm     G. Age:  36w 1d         80  %    HC/AC:      1.01       0.96 - 1.11  AC:      317.3  mm     G. Age:  35w 4d         96  %    FL/BPD:     83.2   %   71 - 87  FL:       72.2  mm     G. Age:  37w 0d       > 97  %    FL/AC:      22.8   %   20 - 24  Est. FW:    2818  gm      6 lb 3 oz   > 90  % ---------------------------------------------------------------------- Gestational Age  LMP:           33w 3d       Date:   11/04/15                 EDD:   08/10/16   U/S Today:     36w 0d                                        EDD:   07/23/16  Best:          33w 3d    Det. By:   LMP  (11/04/15)          EDD:   08/10/16 ---------------------------------------------------------------------- Anatomy  Cranium:               Appears normal         Aortic Arch:            Previously seen  Cavum:                 Appears normal         Ductal Arch:            Previously seen  Ventricles:            Appears normal         Diaphragm:              Appears normal  Choroid Plexus:        Previously seen        Stomach:                Appears normal, left                                                                        sided  Cerebellum:            Previously seen        Abdomen:                Appears normal  Posterior Fossa:       Previously seen  Abdominal Wall:         Previously seen  Nuchal Fold:           Not applicable (>20    Cord Vessels:           Previously seen                         wks GA)  Face:                  Orbits previously      Kidneys:                Appear normal                         seen  Lips:                  Previously seen        Bladder:                Appears normal  Thoracic:              Appears normal         Spine:                  Previously seen  Heart:                 Previously seen        Upper Extremities:      Previously seen  RVOT:                  Appears normal         Lower Extremities:      Previously seen  LVOT:                  Appears normal  Other:  Fetus previously appears to be a female. Heels previously visualized.          Nasal bone previously visualized.  Technically difficult due to fetal          position. ---------------------------------------------------------------------- Cervix Uterus Adnexa  Cervix  Not visualized (advanced GA >29wks) ---------------------------------------------------------------------- Impression  Single IUP at 33w 3d  A2 GDM on glyburide  The estimated fetal weight today is > 90th %tile  (2818 g).  The AC measures > 97th %tile.  Active fetus with BPP of 8/8  Normal amniotic fluid volume ---------------------------------------------------------------------- Recommendations  Recommend 2x weekly NSTs with weekly AFIs  Ultrasound for growth in 4 weeks  Delivery by [redacted] weeks gestation ----------------------------------------------------------------------                Candis Shine, MD Electronically Signed Final Report   06/25/2016 02:03 pm ----------------------------------------------------------------------  [place] Mfm Ob Follow Up  Result Date: 06/25/2016 ----------------------------------------------------------------------  OBSTETRICS REPORT                      (Signed Final 06/25/2016 02:03 pm) ---------------------------------------------------------------------- Patient Info  ID #:       [id number]                         D.O.B.:   1991/02/03 (25 yrs)  Name:       Kelsey Jones            Visit Date:  06/25/2016 01:46 pm ---------------------------------------------------------------------- Performed By  Performed By:     Tresa Endo  Megan Mans          Secondary Phy.:   [facility] for                                                             [facility]                                                             Healthcare                                                             [phone])  Attending:        Candis Shine        Address:          [address]                    [address]                                                             Road                                                             [address]                                                             [place]                                                             [address]  Referred By:      Bing Neighbors  Location:         [facility] MD  Ref. Address:     [address]                    [place], [place]                    [address] ---------------------------------------------------------------------- Orders   #  Description                                 Code   1  [place] MFM OB FOLLOW UP                         [phone]   2  [place] MFM FETAL BPP WO NON STRESS              47829.56  ----------------------------------------------------------------------   #  Ordered By               Order #        Accession #    Episode #   1  Scheryl Darter             [id number]      [id number]     [id number]   2  Scheryl Darter             [id number]      [id number]     [id number]  ---------------------------------------------------------------------- Indications   [redacted] weeks gestation of pregnancy                Z3A.33   Gestational diabetes in pregnancy,             O24.415   controlled by oral hypoglycemic drugs  ---------------------------------------------------------------------- OB History  Blood Type:            Height:  5'7"   Weight (lb):  165      BMI:   25.84  Gravidity:    3         Term:   1        Prem:   0        SAB:   1  TOP:          0       Ectopic:  0        Living: 1 ---------------------------------------------------------------------- Fetal Evaluation  Num Of Fetuses:     1  Fetal Heart         144  Rate(bpm):  Cardiac Activity:   Observed  Presentation:       Cephalic  Placenta:           Anterior, above cervical os  P. Cord Insertion:  Visualized  Amniotic Fluid  AFI FV:      Subjectively within normal limits  AFI Sum(cm)     %Tile       Largest Pocket(cm)  14.4            51          4.98  RUQ(cm)       RLQ(cm)       LUQ(cm)  LLQ(cm)  2.31          3.12          4.98           3.99 ---------------------------------------------------------------------- Biophysical Evaluation  Amniotic F.V:   Within normal limits       F. Tone:        Observed  F. Movement:    Observed                   Score:          8/8  F. Breathing:   Observed  ---------------------------------------------------------------------- Biometry  BPD:      86.8  mm     G. Age:  35w 0d         87  %    CI:        74.05   %   70 - 86                                                          FL/HC:      22.5   %   19.9 - 21.5  HC:      320.3  mm     G. Age:  36w 1d         80  %    HC/AC:      1.01       0.96 - 1.11  AC:      317.3  mm     G. Age:  35w 4d         96  %    FL/BPD:     83.2   %   71 - 87  FL:       72.2  mm     G. Age:  37w 0d       > 97  %    FL/AC:      22.8   %   20 - 24  Est. FW:    2818  gm      6 lb 3 oz   > 90  % ---------------------------------------------------------------------- Gestational Age  LMP:           33w 3d       Date:   11/04/15                 EDD:   08/10/16  U/S Today:     36w 0d                                        EDD:   07/23/16  Best:          33w 3d    Det. By:   LMP  (11/04/15)          EDD:   08/10/16 ---------------------------------------------------------------------- Anatomy  Cranium:               Appears normal         Aortic Arch:            Previously seen  Cavum:                 Appears normal  Ductal Arch:            Previously seen  Ventricles:            Appears normal         Diaphragm:              Appears normal  Choroid Plexus:        Previously seen        Stomach:                Appears normal, left                                                                        sided  Cerebellum:            Previously seen        Abdomen:                Appears normal  Posterior Fossa:       Previously seen        Abdominal Wall:         Previously seen  Nuchal Fold:           Not applicable (>20    Cord Vessels:           Previously seen                         wks GA)  Face:                  Orbits previously      Kidneys:                Appear normal                         seen  Lips:                  Previously seen        Bladder:                Appears normal  Thoracic:              Appears normal         Spine:                   Previously seen  Heart:                 Previously seen        Upper Extremities:      Previously seen  RVOT:                  Appears normal         Lower Extremities:      Previously seen  LVOT:                  Appears normal  Other:  Fetus previously appears to be a female. Heels previously visualized.          Nasal bone previously visualized.  Technically difficult due to fetal          position. ---------------------------------------------------------------------- Cervix Uterus Adnexa  Cervix  Not visualized (  advanced GA >29wks) ---------------------------------------------------------------------- Impression  Single IUP at 33w 3d  A2 GDM on glyburide  The estimated fetal weight today is > 90th %tile (2818 g).  The AC measures > 97th %tile.  Active fetus with BPP of 8/8  Normal amniotic fluid volume ---------------------------------------------------------------------- Recommendations  Recommend 2x weekly NSTs with weekly AFIs  Ultrasound for growth in 4 weeks  Delivery by [redacted] weeks gestation ----------------------------------------------------------------------                Candis Shine, MD Electronically Signed Final Report   06/25/2016 02:03 pm ----------------------------------------------------------------------  [place] Mfm Ob Follow Up  Result Date: 05/29/2016 ----------------------------------------------------------------------  OBSTETRICS REPORT                        (Corrected Final 05/29/2016 04:25                                                                          pm) ---------------------------------------------------------------------- Patient Info  ID #:       [id number]                          D.O.B.:  Jul 26, 1990 (25 yrs)  Name:       Kelsey Jones             Visit Date: 05/29/2016 03:20 pm ---------------------------------------------------------------------- Performed By  Performed By:     Fayne Norrie BS,      Secondary Phy.:   [facility]                                                             [facility]                                                             [facility]                                                             Healthcare                                                             [place])  Attending:        Rema Fendt MD      Address:  [address]                                                             [address]                                                             [place]                                                             [address]  Referred By:      Bing Neighbors              Location:         [facility]                    [place]AR[place] MD  Ref. Address:     [address]                    [place], [place]                    [address] ---------------------------------------------------------------------- Orders   #  Description                                 Code   1  [place] MFM OB FOLLOW UP                         09811.91  ----------------------------------------------------------------------   #  Ordered By               Order #        Accession #    Episode #   1  RACHELLE Marjo Bicker          [id number]      [id number]     [id number]  ---------------------------------------------------------------------- Indications   [redacted] weeks gestation of pregnancy                Z3A.29   Gestational diabetes in pregnancy, diet        O24.410   controlled  ---------------------------------------------------------------------- OB History  Blood Type:            Height:  5'7"   Weight (lb):  165       BMI:  25.84  Gravidity:  3         Term:   1        Prem:   0        SAB:   1  TOP:          0       Ectopic:  0        Living: 1 ---------------------------------------------------------------------- Fetal Evaluation  Num Of Fetuses:     1  Fetal Heart         151  Rate(bpm):  Cardiac Activity:   Observed   Presentation:       Cephalic  Placenta:           Anterior, above cervical os  P. Cord Insertion:  Previously Visualized  Amniotic Fluid  AFI FV:      Subjectively within normal limits  AFI Sum(cm)     %Tile       Largest Pocket(cm)  11.13           22          4.83  RUQ(cm)       RLQ(cm)       LUQ(cm)        LLQ(cm)  2.1           1.97          2.23           4.83 ---------------------------------------------------------------------- Biometry  BPD:      74.7  mm     G. Age:  30w 0d         50  %    CI:        72.52   %    70 - 86                                                          FL/HC:      21.5   %    19.2 - 21.4  HC:       279   mm     G. Age:  30w 4d         44  %    HC/AC:      1.06        0.99 - 1.21  AC:      262.5  mm     G. Age:  30w 2d         69  %    FL/BPD:     80.2   %    71 - 87  FL:       59.9  mm     G. Age:  31w 1d         79  %    FL/AC:      22.8   %    20 - 24  Est. FW:    1607  gm      3 lb 9 oz     72  % ---------------------------------------------------------------------- Gestational Age  LMP:           29w 4d        Date:  11/04/15                 EDD:   08/10/16  U/S Today:     30w 4d  EDD:   08/03/16  Best:          29w 4d     Det. By:  LMP  (11/04/15)          EDD:   08/10/16 ---------------------------------------------------------------------- Anatomy  Cranium:               Appears normal         Aortic Arch:            Previously seen  Cavum:                 Appears normal         Ductal Arch:            Previously seen  Ventricles:            Previously seen        Diaphragm:              Previously seen  Choroid Plexus:        Appears normal         Stomach:                Appears normal, left                                                                        sided  Cerebellum:            Previously seen        Abdomen:                Appears normal  Posterior Fossa:       Previously seen        Abdominal Wall:         Previously  seen  Nuchal Fold:           Not applicable (>20    Cord Vessels:           Previously seen                         wks GA)  Face:                  Orbits previously      Kidneys:                Appear normal                         seen  Lips:                  Appears normal         Bladder:                Appears normal  Thoracic:              Appears normal         Spine:                  Previously seen  Heart:                 Appears normal         Upper Extremities:  Previously seen                         (4CH, axis, and situs  RVOT:                  Previously seen        Lower Extremities:      Previously seen  LVOT:                  Previously seen  Other:  Fetus previously appears to be a female. Heels previously visualized.          Nasal bone previously visualized.  Technically difficult due to fetal          position. ---------------------------------------------------------------------- Cervix Uterus Adnexa  Cervix  Not visualized (advanced GA >29wks) ---------------------------------------------------------------------- Impression  Single living intrauterine pregnancy at 29 weeks 4 days.  Appropriate interval fetal growth (72%).  Normal amniotic fluid volume.  Normal interval fetal anatomy. ---------------------------------------------------------------------- Recommendations  If Ms. Mcconaghy ultimately needs glyburide or insulin to  achieve adequate glycemic control she will need serial  growth scans every 4 weeks and antenatal testing starting at  32 weeks. ----------------------------------------------------------------------               Fayne Norrie, BS, RDMS, RVT Electronically Signed Corrected Final Report  05/29/2016 04:25 pm ----------------------------------------------------------------------   Assessment and Plan:  Pregnancy: G3P1011 at [redacted]w[redacted]d  1. Medication controlled gestational diabetes mellitus in third trimester Had BPP 8/8 yesterday.  Will continue antenatal  testing. Increased Glyburide this week to 5 mg bid.  May need to add Metformin next week if CBGs still elevated.  Growth scan already scheduled.  - glyBURIDE (DIABETA) 5 MG tablet; One tablet by mouth in the morning and at bedtime  Dispense: 60 tablet; Refill: 3 - [place] MFM FETAL BPP WO NON STRESS; Future - [place] MFM FETAL BPP WO NON STRESS; Future - [place] MFM FETAL BPP WO NON STRESS; Future - [place] MFM FETAL BPP WO NON STRESS; Future  2. Hemorrhoids during pregnancy in third trimester Anusol and Colace prescribed.  - hydrocortisone (ANUSOL-HC) 25 MG suppository; Place 1 suppository (25 mg total) rectally 2 (two) times daily.  Dispense: 12 suppository; Refill: 1 - docusate sodium (COLACE) 100 MG capsule; Take 1 capsule (100 mg total) by mouth 2 (two) times daily as needed.  Dispense: 30 capsule; Refill: 2  3. Supervision of high risk pregnancy, antepartum Preterm labor symptoms and general obstetric precautions including but not limited to vaginal bleeding, contractions, leaking of fluid and fetal movement were reviewed in detail with the patient. Please refer to After Visit Summary for other counseling recommendations.  Return in about 1 week (around 07/03/2016) for OB Visit.   Tereso Newcomer, MD

## 2016-06-30 ENCOUNTER — Encounter: Payer: 59 | Admitting: Obstetrics and Gynecology

## 2016-07-03 ENCOUNTER — Ambulatory Visit (HOSPITAL_COMMUNITY)
Admission: RE | Admit: 2016-07-03 | Discharge: 2016-07-03 | Disposition: A | Discharge status: Home / Self Care | Payer: 59 | Source: Ambulatory Visit | Attending: Obstetrics & Gynecology | Admitting: Obstetrics & Gynecology

## 2016-07-03 ENCOUNTER — Other Ambulatory Visit: Payer: Self-pay | Admitting: Obstetrics & Gynecology

## 2016-07-03 DIAGNOSIS — O24414 Gestational diabetes mellitus in pregnancy, insulin controlled: Secondary | ICD-10-CM | POA: Diagnosis present

## 2016-07-03 DIAGNOSIS — Z3A34 34 weeks gestation of pregnancy: Secondary | ICD-10-CM

## 2016-07-07 ENCOUNTER — Ambulatory Visit (INDEPENDENT_AMBULATORY_CARE_PROVIDER_SITE_OTHER): Payer: 59 | Admitting: Obstetrics and Gynecology

## 2016-07-07 VITALS — BP 108/70 | HR 89 | Wt 175.5 lb

## 2016-07-07 DIAGNOSIS — O0990 Supervision of high risk pregnancy, unspecified, unspecified trimester: Secondary | ICD-10-CM

## 2016-07-07 DIAGNOSIS — O24414 Gestational diabetes mellitus in pregnancy, insulin controlled: Secondary | ICD-10-CM | POA: Diagnosis not present

## 2016-07-07 DIAGNOSIS — O0993 Supervision of high risk pregnancy, unspecified, third trimester: Secondary | ICD-10-CM

## 2016-07-07 MED ORDER — METFORMIN HCL 500 MG PO TABS: 500.0000 mg | ORAL_TABLET | Freq: Two times a day (BID) | ORAL | 5 refills | Status: DC
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
## 2016-07-10 ENCOUNTER — Ambulatory Visit (HOSPITAL_COMMUNITY)
Admission: RE | Admit: 2016-07-10 | Discharge: 2016-07-10 | Disposition: A | Discharge status: Home / Self Care | Payer: 59 | Source: Ambulatory Visit | Attending: Obstetrics & Gynecology | Admitting: Obstetrics & Gynecology

## 2016-07-10 ENCOUNTER — Other Ambulatory Visit: Payer: Self-pay | Admitting: Obstetrics & Gynecology

## 2016-07-10 DIAGNOSIS — O24415 Gestational diabetes mellitus in pregnancy, controlled by oral hypoglycemic drugs: Secondary | ICD-10-CM | POA: Diagnosis present

## 2016-07-10 DIAGNOSIS — Z3A35 35 weeks gestation of pregnancy: Secondary | ICD-10-CM

## 2016-07-10 DIAGNOSIS — O24414 Gestational diabetes mellitus in pregnancy, insulin controlled: Secondary | ICD-10-CM

## 2016-07-14 ENCOUNTER — Other Ambulatory Visit (HOSPITAL_COMMUNITY)
Admission: RE | Admit: 2016-07-14 | Discharge: 2016-07-14 | Disposition: A | Discharge status: Home / Self Care | Payer: 59 | Source: Ambulatory Visit | Attending: Obstetrics and Gynecology | Admitting: Obstetrics and Gynecology

## 2016-07-14 ENCOUNTER — Ambulatory Visit (INDEPENDENT_AMBULATORY_CARE_PROVIDER_SITE_OTHER): Payer: 59 | Admitting: Obstetrics and Gynecology

## 2016-07-14 VITALS — BP 111/70 | HR 96 | Wt 178.0 lb

## 2016-07-14 DIAGNOSIS — O24414 Gestational diabetes mellitus in pregnancy, insulin controlled: Secondary | ICD-10-CM

## 2016-07-14 DIAGNOSIS — O0993 Supervision of high risk pregnancy, unspecified, third trimester: Secondary | ICD-10-CM | POA: Insufficient documentation

## 2016-07-14 DIAGNOSIS — Z3A36 36 weeks gestation of pregnancy: Secondary | ICD-10-CM | POA: Insufficient documentation

## 2016-07-14 DIAGNOSIS — O0990 Supervision of high risk pregnancy, unspecified, unspecified trimester: Secondary | ICD-10-CM

## 2016-07-14 LAB — OB RESULTS CONSOLE GBS: GBS: NEGATIVE

## 2016-07-14 NOTE — Progress Notes (Signed)
   PRENATAL VISIT NOTE  Subjective:  Kelsey Jones is a 26 y.o. G3P1011 at [redacted]w[redacted]d being seen today for ongoing prenatal care.  She is currently monitored for the following issues for this high-risk pregnancy and has ASCUS pap smear but negative high-risk HPV on 10/16/2015.   ; Supervision of high risk pregnancy, antepartum; Prior pregnancy complicated by short cervix, antepartum; Low vitamin D level; Short cervix affecting pregnancy; and Medication controlled gestational diabetes mellitus in third trimester on her problem list.  Patient reports no complaints.  Contractions: Irregular. Vag. Bleeding: None.  Movement: Present. Denies leaking of fluid.   The following portions of the patient's history were reviewed and updated as appropriate: allergies, current medications, past family history, past medical history, past social history, past surgical history and problem list. Problem list updated.  Objective:   Vitals:   07/14/16 1045  BP: 111/70  Pulse: 96  Weight: 178 lb (80.7 kg)    Fetal Status: Fetal Heart Rate (bpm): NST Fundal Height: 36 cm Movement: Present     General:  Alert, oriented and cooperative. Patient is in no acute distress.  Skin: Skin is warm and dry. No rash noted.   Cardiovascular: Normal heart rate noted  Respiratory: Normal respiratory effort, no problems with respiration noted  Abdomen: Soft, gravid, appropriate for gestational age. Pain/Pressure: Present     Pelvic:  Cervical exam performed Dilation: Closed Effacement (%): Thick Station: Ballotable  Extremities: Normal range of motion.  Edema: None  Mental Status: Normal mood and affect. Normal behavior. Normal judgment and thought content.   Assessment and Plan:  Pregnancy: G3P1011 at [redacted]w[redacted]d  1. Medication controlled gestational diabetes mellitus in third trimester Patient did not bring CBG log. She has not started metformin as previously instructed She states she modified her diet and noted some  improvement in her values. She is not able to recall numbers Patient to bring CBG log and meter at next visit NST reviewed and reactive- baseline 150, mod variability, +accels, no decels Follow up BPP later this week Growth ultrasound already scheduled  2. Supervision of high risk pregnancy, antepartum Patient is doing well without complaints Cultures today - Strep Gp B NAA - GC/Chlamydia probe amp ([place])not at [facility]  Preterm labor symptoms and general obstetric precautions including but not limited to vaginal bleeding, contractions, leaking of fluid and fetal movement were reviewed in detail with the patient. Please refer to After Visit Summary for other counseling recommendations.  No Follow-up on file.   Catalina Antigua, MD
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
## 2016-07-15 LAB — GC/CHLAMYDIA PROBE AMP (~~LOC~~) NOT AT ARMC
CHLAMYDIA, DNA PROBE: NEGATIVE
NEISSERIA GONORRHEA: NEGATIVE

## 2016-07-16 LAB — STREP GP B NAA: Strep Gp B NAA: NEGATIVE

## 2016-07-17 ENCOUNTER — Other Ambulatory Visit: Payer: Self-pay | Admitting: Obstetrics & Gynecology

## 2016-07-17 ENCOUNTER — Ambulatory Visit (HOSPITAL_COMMUNITY)
Admission: RE | Admit: 2016-07-17 | Discharge: 2016-07-17 | Disposition: A | Discharge status: Home / Self Care | Payer: 59 | Source: Ambulatory Visit | Attending: Obstetrics & Gynecology | Admitting: Obstetrics & Gynecology

## 2016-07-17 DIAGNOSIS — O24414 Gestational diabetes mellitus in pregnancy, insulin controlled: Secondary | ICD-10-CM

## 2016-07-17 DIAGNOSIS — Z3A36 36 weeks gestation of pregnancy: Secondary | ICD-10-CM | POA: Diagnosis not present

## 2016-07-21 ENCOUNTER — Ambulatory Visit (INDEPENDENT_AMBULATORY_CARE_PROVIDER_SITE_OTHER): Payer: 59 | Admitting: Obstetrics and Gynecology

## 2016-07-21 VITALS — BP 112/73 | HR 89 | Wt 183.0 lb

## 2016-07-21 DIAGNOSIS — O0993 Supervision of high risk pregnancy, unspecified, third trimester: Secondary | ICD-10-CM

## 2016-07-21 DIAGNOSIS — O24414 Gestational diabetes mellitus in pregnancy, insulin controlled: Secondary | ICD-10-CM

## 2016-07-21 DIAGNOSIS — O0990 Supervision of high risk pregnancy, unspecified, unspecified trimester: Secondary | ICD-10-CM

## 2016-07-21 MED ORDER — INSULIN ASPART 100 UNIT/ML ~~LOC~~ SOLN: 12.0000 [IU] | Freq: Three times a day (TID) | SUBCUTANEOUS | 12 refills | Status: DC

## 2016-07-21 MED ORDER — "INSULIN SYRINGE-NEEDLE U-100 27G X 1/2"" 1 ML MISC": 0 refills | Status: DC

## 2016-07-21 MED ORDER — INSULIN NPH (HUMAN) (ISOPHANE) 100 UNIT/ML ~~LOC~~ SUSP: 36.0000 [IU] | Freq: Every day | SUBCUTANEOUS | 3 refills | Status: DC

## 2016-07-21 MED ORDER — INSULIN LISPRO 100 UNIT/ML ~~LOC~~ SOLN: 12.0000 [IU] | Freq: Three times a day (TID) | SUBCUTANEOUS | 11 refills | Status: DC

## 2016-07-21 MED ORDER — INSULIN NPH (HUMAN) (ISOPHANE) 100 UNIT/ML ~~LOC~~ SUSP: 14.0000 [IU] | Freq: Every day | SUBCUTANEOUS | 3 refills | Status: DC

## 2016-07-21 NOTE — Progress Notes (Signed)
Patient presents for ROB/NST

## 2016-07-21 NOTE — Addendum Note (Signed)
Addended by: Marya LandryFOSTER, Kyiah Canepa D on: 07/21/2016 02:44 PM   Modules accepted: Orders

## 2016-07-21 NOTE — Progress Notes (Signed)
   PRENATAL VISIT NOTE  Subjective:  Stephannie Lieisha S Yiu is a 26 y.o. G3P1011 at 6651w1d being seen today for ongoing prenatal care.  She is currently monitored for the following issues for this high-risk pregnancy and has ASCUS pap smear but negative high-risk HPV on 10/16/2015.   ; Supervision of high risk pregnancy, antepartum; Prior pregnancy complicated by short cervix, antepartum; Low vitamin D level; Short cervix affecting pregnancy; and Medication controlled gestational diabetes mellitus in third trimester on her problem list.  Patient reports no complaints.  Contractions: Irritability. Vag. Bleeding: None.  Movement: Present. Denies leaking of fluid.   The following portions of the patient's history were reviewed and updated as appropriate: allergies, current medications, past family history, past medical history, past social history, past surgical history and problem list. Problem list updated.  Objective:   Vitals:   07/21/16 1018  BP: 112/73  Pulse: 89  Weight: 183 lb (83 kg)    Fetal Status: Fetal Heart Rate (bpm): NST Fundal Height: 37 cm Movement: Present     General:  Alert, oriented and cooperative. Patient is in no acute distress.  Skin: Skin is warm and dry. No rash noted.   Cardiovascular: Normal heart rate noted  Respiratory: Normal respiratory effort, no problems with respiration noted  Abdomen: Soft, gravid, appropriate for gestational age. Pain/Pressure: Present     Pelvic:  Cervical exam deferred        Extremities: Normal range of motion.  Edema: Trace  Mental Status: Normal mood and affect. Normal behavior. Normal judgment and thought content.   Assessment and Plan:  Pregnancy: G3P1011 at 7251w1d  1. Supervision of high risk pregnancy, antepartum Patient is doing well without complaints - Fetal nonstress test; Future  2. Medication controlled gestational diabetes mellitus in third trimester Patient currently on gluburide 10 mg BID and metformin Meter  reviewed and for the past 2 weeks all values are out of range with values as low as 130 and as high as 250 across the board Will start insulin with NPH 36/14 and pre-meal 14 units. Rx provided and patient plans to return today for teaching Follow up growth ultrasound scheduled later this week - Fetal nonstress test; Future- NST reviewed and reactive with baseline 150, mod variability, pos accels, no decels  Term labor symptoms and general obstetric precautions including but not limited to vaginal bleeding, contractions, leaking of fluid and fetal movement were reviewed in detail with the patient. Please refer to After Visit Summary for other counseling recommendations.  No Follow-up on file.   Quantae Martel, Gigi GinPeggy, MD

## 2016-07-21 NOTE — Progress Notes (Signed)
Changes have been made to Insulin orders due to insurance coverage. Rx changes made per Dr Jolayne Pantheronstant order.

## 2016-07-22 ENCOUNTER — Other Ambulatory Visit: Payer: Self-pay | Admitting: *Deleted

## 2016-07-22 MED ORDER — "INSULIN SYRINGE-NEEDLE U-100 30G X 1/2"" 0.3 ML MISC": 3 refills | Status: DC

## 2016-07-22 NOTE — Progress Notes (Signed)
Insulin syringe/needle change due to pharmacy not having previous order available.

## 2016-07-23 ENCOUNTER — Ambulatory Visit (HOSPITAL_COMMUNITY): Payer: 59

## 2016-07-24 ENCOUNTER — Other Ambulatory Visit: Payer: Self-pay | Admitting: Obstetrics & Gynecology

## 2016-07-24 ENCOUNTER — Ambulatory Visit (HOSPITAL_COMMUNITY)
Admission: RE | Admit: 2016-07-24 | Discharge: 2016-07-24 | Disposition: A | Discharge status: Home / Self Care | Payer: 59 | Source: Ambulatory Visit | Attending: Obstetrics & Gynecology | Admitting: Obstetrics & Gynecology

## 2016-07-24 ENCOUNTER — Other Ambulatory Visit (HOSPITAL_COMMUNITY): Payer: Self-pay | Admitting: Maternal and Fetal Medicine

## 2016-07-24 DIAGNOSIS — Z3A37 37 weeks gestation of pregnancy: Secondary | ICD-10-CM | POA: Diagnosis not present

## 2016-07-24 DIAGNOSIS — O24414 Gestational diabetes mellitus in pregnancy, insulin controlled: Secondary | ICD-10-CM

## 2016-07-24 DIAGNOSIS — O24415 Gestational diabetes mellitus in pregnancy, controlled by oral hypoglycemic drugs: Secondary | ICD-10-CM

## 2016-07-25 ENCOUNTER — Telehealth: Payer: Self-pay | Admitting: *Deleted

## 2016-07-25 ENCOUNTER — Inpatient Hospital Stay (HOSPITAL_COMMUNITY)
Admission: AD | Admit: 2016-07-25 | Discharge: 2016-07-25 | Disposition: A | Discharge status: Home / Self Care | Payer: 59 | Source: Ambulatory Visit | Attending: Family Medicine | Admitting: Family Medicine

## 2016-07-25 ENCOUNTER — Encounter (HOSPITAL_COMMUNITY): Payer: Self-pay | Admitting: *Deleted

## 2016-07-25 DIAGNOSIS — Z3A37 37 weeks gestation of pregnancy: Secondary | ICD-10-CM | POA: Insufficient documentation

## 2016-07-25 DIAGNOSIS — O9989 Other specified diseases and conditions complicating pregnancy, childbirth and the puerperium: Secondary | ICD-10-CM | POA: Diagnosis not present

## 2016-07-25 DIAGNOSIS — O471 False labor at or after 37 completed weeks of gestation: Secondary | ICD-10-CM | POA: Diagnosis present

## 2016-07-25 DIAGNOSIS — O0990 Supervision of high risk pregnancy, unspecified, unspecified trimester: Secondary | ICD-10-CM

## 2016-07-25 HISTORY — DX: Gestational diabetes mellitus in pregnancy, unspecified control: O24.419

## 2016-07-25 HISTORY — DX: Incompetence of cervix uteri: N88.3

## 2016-07-25 LAB — URINALYSIS, ROUTINE W REFLEX MICROSCOPIC
Bilirubin Urine: NEGATIVE
Glucose, UA: >=500 mg/dL — AB
Ketones, ur: NEGATIVE mg/dL
Nitrite: NEGATIVE
PH: 5 (ref 5.0–8.0)
Protein, ur: 100 mg/dL — AB
SPECIFIC GRAVITY, URINE: 1.03 (ref 1.005–1.030)

## 2016-07-25 LAB — GLUCOSE, CAPILLARY: GLUCOSE-CAPILLARY: 135 mg/dL — AB (ref 65–99)

## 2016-07-25 MED ORDER — LACTATED RINGERS IV BOLUS (SEPSIS)
1000.0000 mL | Freq: Once | INTRAVENOUS | Status: AC
  Administered 2016-07-25: 1000 mL via INTRAVENOUS

## 2016-07-25 NOTE — Telephone Encounter (Signed)
Patient is at work and she reports feeling a lot of pressure and contractions while she is at work. She is having to sit at this time. Advised patient she needs to go to MAU to be checked for signs of labor.

## 2016-07-25 NOTE — Progress Notes (Signed)
Patient states she has not had anything to drink today.  Large water given.  Encouraged PO fluids.  Urinalysis pending.

## 2016-07-25 NOTE — Discharge Instructions (Signed)
Gestational Diabetes Mellitus, Self Care When you have gestational diabetes (gestational diabetes mellitus), you must keep your blood sugar (glucose) under control. You can do this with:  Nutrition.  Exercise.  Lifestyle changes.  Medicines or insulin, if needed.  Support from your doctors and others. How do I manage my blood sugar?  Check your blood sugar every day during pregnancy. Check it as often as told.  Call your doctor if your blood sugar is above your goal numbers for 2 tests in a row. Your doctor will set treatment goals for you. Try to have these blood sugars:  After not eating for a long time (after fasting): at or below 95 mg/dL (5.3 mmol/L).  After meals (postprandial):  One hour after a meal: at or below 140 mg/dL (7.8 mmol/L).  Two hours after a meal: at or below 120 mg/dL (6.7 mmol/L).  A1c (hemoglobin A1c) level: 6-6.5%. What do I need to know about high blood sugar? High blood sugar is called hyperglycemia. Know the early signs of high blood sugar. Signs include:  Feeling:  Thirsty.  Hungry.  Very tired.  Needing to pee (urinate) more than usual.  Blurry vision. What do I need to know about low blood sugar? Low blood sugar is called hypoglycemia. This is when blood sugar is at or below 70 mg/dL (3.9 mmol/L). Symptoms may include:  Feeling:  Hungry.  Worried or nervous (anxious).  Sweaty or clammy.  Confused.  Dizzy.  Sleepy.  Sick to your stomach (nauseous).  Having:  A fast heartbeat.  A headache.  A change in vision.  Jerky movements that you cannot control (seizure).  Nightmares.  Tingling or no feeling (numbness) around the mouth, lips, or tongue.  Having trouble with:  Talking.  Paying attention (concentrating).  Moving (coordination).  Sleeping.  Shaking.  Passing out (fainting).  Getting upset easily (irritability). Treating low blood sugar   To treat low blood sugar, eat or drink something sugary  right away. If you can think clearly and swallow safely, follow the 15:15 rule:  Take 15 grams of a fast-acting carb (carbohydrate). Some fast-acting carbs are:  1 tube of glucose gel.  3 sugar tablets (glucose pills).  6-8 pieces of hard candy.  4 oz (120 mL) of fruit juice.  4 oz (120 mL) regular (not diet) soda.  Check your blood sugar 15 minutes after you take the carb.  If your blood sugar is still at or below 70 mg/dL (3.9 mmol/L), take 15 grams of a carb again.  If your blood sugar does not go above 70 mg/dL (3.9 mmol/L) after 3 tries, get help right away.  After your blood sugar goes back to normal, eat a meal or a snack within 1 hour. Treating very low blood sugar  If your blood sugar is at or below 54 mg/dL (3 mmol/L), you have very low blood sugar (severe hypoglycemia). This is an emergency. Do not wait to see if the symptoms will go away. Get medical help right away. Call your local emergency services (911 in the [place] Do not drive yourself to the hospital. If you have very low blood sugar and you cannot eat or drink, you may need a glucagon shot (injection). A family member or friend should learn:  How to check your blood sugar.  How to give you a glucagon shot. Ask your doctor if you need a glucagon shot kit at home. What else is important to manage my diabetes? Medicine   Take your insulin   and diabetes medicines as told.  Do not run out of insulin or medicines.  Adjust your insulin and diabetes medicines as told. Food    Make healthy food choices. These include:  Chicken, fish, egg whites, and beans.  Oats, whole wheat, bulgur, brown rice, quinoa, and millet.  Fresh fruits and vegetables.  Low-fat dairy products.  Nuts, avocado, olive oil, and canola oil.  Make an eating plan. A food specialist (dietitian) can help you.  Follow instructions from your doctor about what you cannot eat or drink.  Drink enough fluid to keep your pee (urine) clear or  pale yellow.  Eat healthy snacks between healthy meals.  Keep track of carbs you eat. Read food labels. Learn about food serving sizes.  Follow your sick day plan when you cannot eat or drink normally. Make this plan with your doctor. Activity   Exercise 30 minutes or more a day or as much as told by your doctor.  Talk with your doctor if you start a new exercise. Your doctor may need to adjust your insulin, medicines, or food. Lifestyle   Do not drink alcohol.  Do not use any tobacco products. If you need help quitting, ask your doctor.  Learn how to deal with stress. If you need help with this, ask your doctor. Body care    Stay up to date with your shots (immunizations).  Brush your teeth and gums two times a day. Floss at least one time a day.  Go to the dentist least one time every 6 months.  Stay at a healthy weight while you are pregnant. General instructions   Take over-the-counter and prescription medicines only as told by your doctor.  Ask your doctor about risks of high blood pressure in pregnancy. These are called preeclampsia and eclampsia.  Share your diabetes care plan with:  Your work or school.  People you live with.  Check your pee for ketones:  When you are sick.  As told by your doctor.  Ask your doctor:  Do I need to meet with a diabetes educator?  Where can I find a support group for people with diabetes?  Carry a card or wear jewelry that says that you have diabetes.  Keep all follow-up visits with your doctor. This is important. Care after giving birth    Have your blood sugar checked 4-12 weeks after you give birth.  Get checked for diabetes at least every 3 years. Where to find more information: To learn more about diabetes, visit:  American Diabetes Association: www.diabetes.org/diabetes-basics/gestational  Centers for Disease Control and Prevention (CDC): www.cdc.gov/diabetes/pubs/pdf/gestationalDiabetes.pdf This  information is not intended to replace advice given to you by your health care provider. Make sure you discuss any questions you have with your health care provider. Document Released: 06/25/2015 Document Revised: 08/09/2015 Document Reviewed: 04/06/2015 Elsevier Interactive Patient Education  2017 Elsevier Inc.  

## 2016-07-25 NOTE — Progress Notes (Signed)
Called Philipp DeputyKim Shaw, CNM to evaluate FHR tracing and discuss plan of care.  Herbert SetaHeather, RN on birthing suites relayed message.  CNM in delivery and requests MAU provider evaluate patient.

## 2016-07-25 NOTE — MAU Provider Note (Signed)
Patient is a 25 year G3P1011 at 8323w5d here with complaints of contractions; denies bleeding, leaking of fluid or decreased fetal movement. Patient was recently placed on insulin after she failed her diet controlled diabetic regimen. Patient had BPP yesterday; 8/8 with normal fluid volume. Fetal weight estimated 8 lbs 7 oz.    Cervical exam is 0.5/thick/posterior. After some initial minimal variability patient was given a 500 ml bolus of LR. Tracing now Cat 1 with 140 BPM, moderate variability, no decels, present acels and occasional short contractions. FHR tracing reviewed with Dr. Shawnie PonsPratt; patient stable to do home. Patient plans to keep appt with [facility] on 07/29/2016.  Reviewed when to return to MAU (bleeding, leakign of fluid, contractions, any signs of hyperglycemia or hypoglycemia). All questions answered.  Luna KitchensKathryn Kooistra CNM

## 2016-07-25 NOTE — MAU Note (Signed)
Pt c/o contractions that started this morning around 5am. Pt c/o vaginal pressure that started today. Pt c/o urinary frequency. Pt states baby is moving normally. Pt denies bleeding and leaking. Pt c/o increased discharge starting earlier this week.

## 2016-07-26 LAB — CULTURE, OB URINE

## 2016-07-28 ENCOUNTER — Encounter: Payer: 59 | Admitting: Obstetrics and Gynecology

## 2016-07-29 ENCOUNTER — Telehealth (HOSPITAL_COMMUNITY): Payer: Self-pay | Admitting: *Deleted

## 2016-07-29 ENCOUNTER — Encounter (HOSPITAL_COMMUNITY): Payer: Self-pay | Admitting: *Deleted

## 2016-07-29 NOTE — Telephone Encounter (Signed)
Preadmission screen  

## 2016-07-31 ENCOUNTER — Other Ambulatory Visit: Payer: Self-pay | Admitting: Advanced Practice Midwife

## 2016-08-03 ENCOUNTER — Inpatient Hospital Stay (HOSPITAL_COMMUNITY): Payer: 59 | Admitting: Anesthesiology

## 2016-08-03 ENCOUNTER — Inpatient Hospital Stay (HOSPITAL_COMMUNITY)
Admission: RE | Admit: 2016-08-03 | Discharge: 2016-08-05 | DRG: 775 | Disposition: A | Discharge status: Home / Self Care | Payer: 59 | Source: Ambulatory Visit | Attending: Obstetrics & Gynecology | Admitting: Obstetrics & Gynecology

## 2016-08-03 ENCOUNTER — Encounter (HOSPITAL_COMMUNITY): Payer: Self-pay | Admitting: *Deleted

## 2016-08-03 DIAGNOSIS — O26873 Cervical shortening, third trimester: Secondary | ICD-10-CM | POA: Diagnosis present

## 2016-08-03 DIAGNOSIS — Z3A39 39 weeks gestation of pregnancy: Secondary | ICD-10-CM

## 2016-08-03 DIAGNOSIS — Z87891 Personal history of nicotine dependence: Secondary | ICD-10-CM

## 2016-08-03 DIAGNOSIS — O24414 Gestational diabetes mellitus in pregnancy, insulin controlled: Secondary | ICD-10-CM

## 2016-08-03 DIAGNOSIS — O0990 Supervision of high risk pregnancy, unspecified, unspecified trimester: Secondary | ICD-10-CM

## 2016-08-03 DIAGNOSIS — O24425 Gestational diabetes mellitus in childbirth, controlled by oral hypoglycemic drugs: Secondary | ICD-10-CM | POA: Diagnosis not present

## 2016-08-03 DIAGNOSIS — Z8632 Personal history of gestational diabetes: Secondary | ICD-10-CM | POA: Diagnosis present

## 2016-08-03 DIAGNOSIS — O24419 Gestational diabetes mellitus in pregnancy, unspecified control: Secondary | ICD-10-CM | POA: Diagnosis present

## 2016-08-03 LAB — CBC
HEMATOCRIT: 34.4 % — AB (ref 36.0–46.0)
Hemoglobin: 11.5 g/dL — ABNORMAL LOW (ref 12.0–15.0)
MCH: 27.2 pg (ref 26.0–34.0)
MCHC: 33.4 g/dL (ref 30.0–36.0)
MCV: 81.3 fL (ref 78.0–100.0)
Platelets: 175 10*3/uL (ref 150–400)
RBC: 4.23 MIL/uL (ref 3.87–5.11)
RDW: 14.9 % (ref 11.5–15.5)
WBC: 9.9 10*3/uL (ref 4.0–10.5)

## 2016-08-03 LAB — GLUCOSE, CAPILLARY
Glucose-Capillary: 146 mg/dL — ABNORMAL HIGH (ref 65–99)
Glucose-Capillary: 135 mg/dL — ABNORMAL HIGH (ref 65–99)
Glucose-Capillary: 86 mg/dL (ref 65–99)

## 2016-08-03 LAB — TYPE AND SCREEN
ABO/RH(D): O POS
Antibody Screen: NEGATIVE

## 2016-08-03 LAB — ABO/RH: ABO/RH(D): O POS

## 2016-08-03 MED ORDER — BENZOCAINE-MENTHOL 20-0.5 % EX AERO
1.0000 "application " | INHALATION_SPRAY | CUTANEOUS | Status: DC | PRN
  Administered 2016-08-03: 1 via TOPICAL
  Filled 2016-08-03: qty 56

## 2016-08-03 MED ORDER — IBUPROFEN 600 MG PO TABS
600.0000 mg | ORAL_TABLET | Freq: Four times a day (QID) | ORAL | Status: DC
  Administered 2016-08-03 – 2016-08-05 (×7): 600 mg via ORAL
  Filled 2016-08-03 (×7): qty 1

## 2016-08-03 MED ORDER — ONDANSETRON HCL 4 MG/2ML IJ SOLN
4.0000 mg | Freq: Four times a day (QID) | INTRAMUSCULAR | Status: DC | PRN
  Administered 2016-08-03: 4 mg via INTRAVENOUS
  Filled 2016-08-03: qty 2

## 2016-08-03 MED ORDER — ACETAMINOPHEN 325 MG PO TABS: 650.0000 mg | ORAL_TABLET | ORAL | Status: DC | PRN

## 2016-08-03 MED ORDER — SENNOSIDES-DOCUSATE SODIUM 8.6-50 MG PO TABS
2.0000 | ORAL_TABLET | ORAL | Status: DC
  Administered 2016-08-03 – 2016-08-04 (×2): 2 via ORAL
  Filled 2016-08-03 (×2): qty 2

## 2016-08-03 MED ORDER — LACTATED RINGERS IV SOLN
500.0000 mL | Freq: Once | INTRAVENOUS | Status: AC
Start: 2016-08-03 — End: 2016-08-03
  Administered 2016-08-03: 500 mL via INTRAVENOUS

## 2016-08-03 MED ORDER — TERBUTALINE SULFATE 1 MG/ML IJ SOLN: 0.2500 mg | Freq: Once | INTRAMUSCULAR | Status: DC | PRN

## 2016-08-03 MED ORDER — ONDANSETRON HCL 4 MG PO TABS: 4.0000 mg | ORAL_TABLET | ORAL | Status: DC | PRN

## 2016-08-03 MED ORDER — COCONUT OIL OIL
1.0000 "application " | TOPICAL_OIL | Status: DC | PRN
  Administered 2016-08-03: 1 via TOPICAL
  Filled 2016-08-03: qty 120

## 2016-08-03 MED ORDER — SODIUM CHLORIDE 0.9% FLUSH: 3.0000 mL | Freq: Two times a day (BID) | INTRAVENOUS | Status: DC

## 2016-08-03 MED ORDER — OXYTOCIN BOLUS FROM INFUSION: 500.0000 mL | Freq: Once | INTRAVENOUS | Status: DC

## 2016-08-03 MED ORDER — SODIUM CHLORIDE 0.9 % IV SOLN: 250.0000 mL | INTRAVENOUS | Status: DC | PRN

## 2016-08-03 MED ORDER — OXYTOCIN 40 UNITS IN LACTATED RINGERS INFUSION - SIMPLE MED
2.5000 [IU]/h | INTRAVENOUS | Status: DC
  Administered 2016-08-03: 2.5 [IU]/h via INTRAVENOUS

## 2016-08-03 MED ORDER — MEASLES, MUMPS & RUBELLA VAC ~~LOC~~ INJ
0.5000 mL | INJECTION | Freq: Once | SUBCUTANEOUS | Status: DC
  Filled 2016-08-03: qty 0.5

## 2016-08-03 MED ORDER — TETANUS-DIPHTH-ACELL PERTUSSIS 5-2.5-18.5 LF-MCG/0.5 IM SUSP
0.5000 mL | Freq: Once | INTRAMUSCULAR | Status: DC
  Filled 2016-08-03: qty 0.5

## 2016-08-03 MED ORDER — LACTATED RINGERS IV SOLN: 500.0000 mL | INTRAVENOUS | Status: DC | PRN

## 2016-08-03 MED ORDER — LIDOCAINE HCL (PF) 1 % IJ SOLN: 30.0000 mL | INTRAMUSCULAR | Status: DC | PRN

## 2016-08-03 MED ORDER — DIPHENHYDRAMINE HCL 25 MG PO CAPS: 25.0000 mg | ORAL_CAPSULE | Freq: Four times a day (QID) | ORAL | Status: DC | PRN

## 2016-08-03 MED ORDER — FENTANYL 2.5 MCG/ML BUPIVACAINE 1/10 % EPIDURAL INFUSION (WH - ANES)
14.0000 mL/h | INTRAMUSCULAR | Status: DC | PRN
  Administered 2016-08-03 (×2): 14 mL/h via EPIDURAL
  Filled 2016-08-03: qty 100

## 2016-08-03 MED ORDER — EPHEDRINE 5 MG/ML INJ: 10.0000 mg | INTRAVENOUS | Status: DC | PRN

## 2016-08-03 MED ORDER — WITCH HAZEL-GLYCERIN EX PADS: 1.0000 "application " | MEDICATED_PAD | CUTANEOUS | Status: DC | PRN

## 2016-08-03 MED ORDER — PRENATAL MULTIVITAMIN CH
1.0000 | ORAL_TABLET | Freq: Every day | ORAL | Status: DC
  Administered 2016-08-04 – 2016-08-05 (×2): 1 via ORAL
  Filled 2016-08-03 (×2): qty 1

## 2016-08-03 MED ORDER — DIPHENHYDRAMINE HCL 50 MG/ML IJ SOLN: 12.5000 mg | INTRAMUSCULAR | Status: DC | PRN

## 2016-08-03 MED ORDER — FENTANYL CITRATE (PF) 100 MCG/2ML IJ SOLN
100.0000 ug | INTRAMUSCULAR | Status: DC | PRN
  Administered 2016-08-03 (×2): 100 ug via INTRAVENOUS
  Filled 2016-08-03 (×2): qty 2

## 2016-08-03 MED ORDER — MISOPROSTOL 25 MCG QUARTER TABLET
25.0000 ug | ORAL_TABLET | ORAL | Status: DC | PRN
  Administered 2016-08-03: 25 ug via VAGINAL
  Filled 2016-08-03 (×2): qty 1

## 2016-08-03 MED ORDER — OXYCODONE-ACETAMINOPHEN 5-325 MG PO TABS: 2.0000 | ORAL_TABLET | ORAL | Status: DC | PRN

## 2016-08-03 MED ORDER — OXYTOCIN 40 UNITS IN LACTATED RINGERS INFUSION - SIMPLE MED
1.0000 m[IU]/min | INTRAVENOUS | Status: DC
  Administered 2016-08-03: 2 m[IU]/min via INTRAVENOUS
  Filled 2016-08-03: qty 1000

## 2016-08-03 MED ORDER — SODIUM CHLORIDE 0.9% FLUSH: 3.0000 mL | INTRAVENOUS | Status: DC | PRN

## 2016-08-03 MED ORDER — ZOLPIDEM TARTRATE 5 MG PO TABS
5.0000 mg | ORAL_TABLET | Freq: Every evening | ORAL | Status: DC | PRN
Start: 2016-08-03 — End: 2016-08-05

## 2016-08-03 MED ORDER — PHENYLEPHRINE 40 MCG/ML (10ML) SYRINGE FOR IV PUSH (FOR BLOOD PRESSURE SUPPORT)
80.0000 ug | PREFILLED_SYRINGE | INTRAVENOUS | Status: DC | PRN
  Filled 2016-08-03: qty 10

## 2016-08-03 MED ORDER — SIMETHICONE 80 MG PO CHEW: 80.0000 mg | CHEWABLE_TABLET | ORAL | Status: DC | PRN

## 2016-08-03 MED ORDER — PHENYLEPHRINE 40 MCG/ML (10ML) SYRINGE FOR IV PUSH (FOR BLOOD PRESSURE SUPPORT): 80.0000 ug | PREFILLED_SYRINGE | INTRAVENOUS | Status: DC | PRN

## 2016-08-03 MED ORDER — DIBUCAINE 1 % RE OINT: 1.0000 "application " | TOPICAL_OINTMENT | RECTAL | Status: DC | PRN

## 2016-08-03 MED ORDER — LIDOCAINE HCL (PF) 1 % IJ SOLN
INTRAMUSCULAR | Status: DC | PRN
  Administered 2016-08-03: 4 mL
  Administered 2016-08-03: 6 mL via EPIDURAL

## 2016-08-03 MED ORDER — LACTATED RINGERS IV SOLN
INTRAVENOUS | Status: DC
  Administered 2016-08-03 (×2): via INTRAVENOUS

## 2016-08-03 MED ORDER — SOD CITRATE-CITRIC ACID 500-334 MG/5ML PO SOLN: 30.0000 mL | ORAL | Status: DC | PRN

## 2016-08-03 MED ORDER — MISOPROSTOL 25 MCG QUARTER TABLET
25.0000 ug | ORAL_TABLET | ORAL | Status: DC | PRN
  Filled 2016-08-03: qty 1

## 2016-08-03 MED ORDER — TERBUTALINE SULFATE 1 MG/ML IJ SOLN
0.2500 mg | Freq: Once | INTRAMUSCULAR | Status: DC | PRN
  Filled 2016-08-03: qty 1

## 2016-08-03 MED ORDER — ONDANSETRON HCL 4 MG/2ML IJ SOLN: 4.0000 mg | INTRAMUSCULAR | Status: DC | PRN

## 2016-08-03 MED ORDER — OXYCODONE-ACETAMINOPHEN 5-325 MG PO TABS: 1.0000 | ORAL_TABLET | ORAL | Status: DC | PRN

## 2016-08-03 NOTE — Progress Notes (Signed)
Patient eating and then pumping with debp per lactation instruction.

## 2016-08-03 NOTE — Anesthesia Pain Management Evaluation Note (Signed)
  CRNA Pain Management Visit Note  Patient: Stephannie Lieisha S Kolasinski, 26 y.o., female  "Hello I am a member of the anesthesia team at [facility]. We have an anesthesia team available at all times to provide care throughout the hospital, including epidural management and anesthesia for C-section. I don't know your plan for the delivery whether it a natural birth, water birth, IV sedation, nitrous supplementation, doula or epidural, but we want to meet your pain goals."   1.Was your pain managed to your expectations on prior hospitalizations?   Yes   2.What is your expectation for pain management during this hospitalization?     Epidural  3.How can we help you reach that goal?   Record the patient's initial score and the patient's pain goal.   Pain: 0  Pain Goal: 6 [facility] wants you to be able to say your pain was always managed very well.  Laban EmperorMalinova,Marlita Keil Hristova 08/03/2016

## 2016-08-03 NOTE — Lactation Note (Signed)
Lactation Consultation Note  Patient Name: Stephannie Lieisha S Solorio ZOXWR'UToday's Date: 08/03/2016  Mom desired nipples to be evaluated while she is in labor.  She is worried nipples look cracked.  Both nipples intact and invert some with compression.  Reassured patient that nipples do not look cracked.  She denies any nipple discomfort.   Maternal Data    Feeding    LATCH Score/Interventions                      Lactation Tools Discussed/Used WIC Program: Yes   Consult Status      Huston FoleyMOULDEN, Zakkery Dorian S 08/03/2016, 11:47 AM

## 2016-08-03 NOTE — Lactation Note (Signed)
This note was copied from a baby's chart. Lactation Consultation Note  Patient Name: Kelsey Jones'JToday's Date: 08/03/2016 Reason for consult: Initial assessment  Baby 2 hours old. Mom reports that she has decided to pump and bottle feed EBM d/t baby's natal tooth. CN RN is going to set mom up with DEBP. Patient's bedside nurse, Vanessa KickJan, RN notified.   Maternal Data Has patient been taught Hand Expression?: Yes Does the patient have breastfeeding experience prior to this delivery?: No  Feeding Feeding Type: Breast Fed  LATCH Score/Interventions Latch: Repeated attempts needed to sustain latch, nipple held in mouth throughout feeding, stimulation needed to elicit sucking reflex. Intervention(s): Adjust position;Assist with latch  Audible Swallowing: None Intervention(s): Skin to skin;Hand expression  Type of Nipple: Inverted (Right nipple) Intervention(s): Shells  Comfort (Breast/Nipple): Soft / non-tender     Hold (Positioning): Assistance needed to correctly position infant at breast and maintain latch.  LATCH Score: 4  Lactation Tools Discussed/Used     Consult Status Consult Status: Follow-up Date: 08/03/16 Follow-up type: In-patient    Sherlyn HayJennifer D Julianna Vanwagner 08/03/2016, 10:09 PM

## 2016-08-03 NOTE — Lactation Note (Signed)
This note was copied from a baby's chart. Lactation Consultation Note  Patient Name: Kelsey Jones Today's Date: 08/03/2016 Reason for consult: Initial assessment  Baby 1 hours old, in B/S, mom GDM. Mom nauseous and very tired, but wanting baby to be fed. Mom reports that she did not nurse first child, but wants to nurse this child. Since mom had just attempted to latch baby prior to this LC entering the room and mom not able to latch baby at this moment d/t lying back for and in-and-out catheter and feeling nauseous, assisted mom with hand expression and able to spoon-feed baby 5 ml of EBM. Mom's right breast easily compressible and expressible and nipple everts well. Mom's right nipple inverted and breast not easily compressible. Baby tolerated spoon-feeding well. Discussed assessment and interventions with [address] Nursery RN, Cat.  Maternal Data Has patient been taught Hand Expression?: Yes Does the patient have breastfeeding experience prior to this delivery?: No  Feeding Feeding Type: Breast Fed  LATCH Score/Interventions Latch: Repeated attempts needed to sustain latch, nipple held in mouth throughout feeding, stimulation needed to elicit sucking reflex. Intervention(s): Adjust position;Assist with latch  Audible Swallowing: None Intervention(s): Skin to skin;Hand expression  Type of Nipple: Inverted (Right nipple) Intervention(s): Shells  Comfort (Breast/Nipple): Soft / non-tender     Hold (Positioning): Assistance needed to correctly position infant at breast and maintain latch.  LATCH Score: 4  Lactation Tools Discussed/Used     Consult Status Consult Status: Follow-up Date: 08/03/16 Follow-up type: In-patient    Sherlyn HayJennifer D Holley Kocurek 08/03/2016, 8:46 PM

## 2016-08-03 NOTE — Anesthesia Procedure Notes (Signed)
Epidural Patient location during procedure: OB  Staffing Anesthesiologist: Tongela Encinas  Preanesthetic Checklist Completed: patient identified, site marked, surgical consent, pre-op evaluation, timeout performed, IV checked, risks and benefits discussed and monitors and equipment checked  Epidural Patient position: sitting Prep: site prepped and draped and DuraPrep Patient monitoring: continuous pulse ox and blood pressure Approach: midline Location: L3-L4 Injection technique: LOR air  Needle:  Needle type: Tuohy  Needle gauge: 17 G Needle length: 9 cm and 9 Needle insertion depth: 5 cm cm Catheter type: closed end flexible Catheter size: 19 Gauge Catheter at skin depth: 10 cm Test dose: negative  Assessment Events: blood not aspirated, injection not painful, no injection resistance, negative IV test and no paresthesia  Additional Notes Dosing of Epidural:  1st dose, through catheter .............................................  Xylocaine 40 mg  2nd dose, through catheter, after waiting 3 minutes.........Xylocaine 60 mg    As each dose occurred, patient was free of IV sx; and patient exhibited no evidence of SA injection.  Patient is more comfortable after epidural dosed. Please see RN's note for documentation of vital signs,and FHR which are stable.  Patient reminded not to try to ambulate with numb legs, and that an RN must be present when she attempts to get up.        

## 2016-08-03 NOTE — Anesthesia Preprocedure Evaluation (Signed)
Anesthesia Evaluation  Patient identified by MRN, date of birth, ID band Patient awake    Reviewed: Allergy & Precautions, H&P , Patient's Chart, lab work & pertinent test results  Airway Mallampati: II  TM Distance: >3 FB Neck ROM: full    Dental  (+) Teeth Intact   Pulmonary former smoker,    breath sounds clear to auscultation       Cardiovascular  Rhythm:regular Rate:Normal     Neuro/Psych    GI/Hepatic   Endo/Other  diabetes, Insulin Dependent  Renal/GU      Musculoskeletal   Abdominal   Peds  Hematology   Anesthesia Other Findings       Reproductive/Obstetrics (+) Pregnancy                             Anesthesia Physical Anesthesia Plan  ASA: III  Anesthesia Plan: Epidural   Post-op Pain Management:    Induction:   Airway Management Planned:   Additional Equipment:   Intra-op Plan:   Post-operative Plan:   Informed Consent: I have reviewed the patients History and Physical, chart, labs and discussed the procedure including the risks, benefits and alternatives for the proposed anesthesia with the patient or authorized representative who has indicated his/her understanding and acceptance.   Dental Advisory Given  Plan Discussed with:   Anesthesia Plan Comments: (Labs checked- platelets confirmed with RN in room. Fetal heart tracing, per RN, reported to be stable enough for sitting procedure. Discussed epidural, and patient consents to the procedure:  included risk of possible headache,backache, failed block, allergic reaction, and nerve injury. This patient was asked if she had any questions or concerns before the procedure started.)        Anesthesia Quick Evaluation

## 2016-08-03 NOTE — Progress Notes (Signed)
Kelsey Jones is a 26 y.o. G3P1011 at 1836w0d by ultrasound admitted for induction of labor due to Gestational diabetes.  Subjective:   Objective: BP 120/75   Pulse 85   Temp 98.6 F (37 C) (Oral)   Resp 18   Ht 5\' 8"  (1.727 m)   Wt 183 lb (83 kg)   LMP 11/04/2015 (Approximate)   Breastfeeding? Unknown   BMI 27.83 kg/m  No intake/output data recorded. No intake/output data recorded.  FHT:  FHR: 150 bpm, variability: moderate,  accelerations:  Present,  decelerations:  Absent UC:   regular, every 2 minutes SVE:   Dilation: 1 Effacement (%): 80 Station: -2 Exam by:: Cleone SlimH. Mitchell, RN  Labs: Lab Results  Component Value Date   WBC 9.9 08/03/2016   HGB 11.5 (L) 08/03/2016   HCT 34.4 (L) 08/03/2016   MCV 81.3 08/03/2016   PLT 175 08/03/2016    Assessment / Plan: Induction of labor due to gestational diabetes,  progressing well on pitocin  Labor: yet to be in labor Preeclampsia:  no signs or symptoms of toxicity Fetal Wellbeing:  Category I Pain Control:  Labor support without medications I/D:  n/a Anticipated MOD:  NSVD  Kelsey Jones 08/03/2016, 2:15 PM

## 2016-08-03 NOTE — H&P (Signed)
Stephannie Lieisha S Grewell is a 26 y.o. female presenting for IOL for GDM at 39 wks. GBS neg. OB History    Gravida Para Term Preterm AB Living   3 1 1  0 1 1   SAB TAB Ectopic Multiple Live Births   0 1 0 0 1     Past Medical History:  Diagnosis Date  . Gestational diabetes    metformin  . Medical history non-contributory   . Short cervix    Past Surgical History:  Procedure Laterality Date  . INDUCED ABORTION     Family History: family history includes Cancer in her maternal grandfather and maternal grandmother; Diabetes in her maternal grandmother and mother. Social History:  reports that she has quit smoking. Her smoking use included Cigars. She has never used smokeless tobacco. She reports that she does not drink alcohol or use drugs.     Maternal Diabetes: Yes:  Diabetes Type:  Insulin/Medication controlled Genetic Screening: Normal Maternal Ultrasounds/Referrals: Normal Fetal Ultrasounds or other Referrals:  None Maternal Substance Abuse:  No Significant Maternal Medications:  None Significant Maternal Lab Results:  None Other Comments:  None  ROS Maternal Medical History:  Reason for admission: IOL for GDM  Contractions: none  Fetal activity: Perceived fetal activity is normal.   Last perceived fetal movement was within the past hour.    Prenatal Complications - Diabetes: type 2. Diabetes is managed by oral agent (monotherapy) and intensive insulin program.      Dilation: 1 Effacement (%): 80 Station: -2 Exam by:: Cleone SlimH. Mitchell, RN Blood pressure 113/80, pulse 84, temperature 98.5 F (36.9 C), temperature source Oral, resp. rate 18, height 5\' 8"  (1.727 m), weight 183 lb (83 kg), last menstrual period 11/04/2015, unknown if currently breastfeeding. Maternal Exam:  Uterine Assessment: No uc's  Abdomen: Patient reports no abdominal tenderness. Fetal presentation: vertex  Introitus: Normal vulva. Normal vagina.  Pelvis: adequate for delivery.      Fetal  Exam Fetal Monitor Review: Mode: ultrasound.   Variability: moderate (6-25 bpm).   Pattern: accelerations present.    Fetal State Assessment: Category I - tracings are normal.     Physical Exam  Constitutional: She is oriented to person, place, and time. She appears well-developed and well-nourished.  HENT:  Head: Normocephalic.  Eyes: Pupils are equal, round, and reactive to light.  Cardiovascular: Normal rate, regular rhythm, normal heart sounds and intact distal pulses.   Respiratory: Effort normal and breath sounds normal.  GI: Soft. Bowel sounds are normal.  Genitourinary: Vagina normal and uterus normal.  Musculoskeletal: Normal range of motion.  Neurological: She is alert and oriented to person, place, and time. She has normal reflexes.  Skin: Skin is warm and dry.  Psychiatric: She has a normal mood and affect. Her behavior is normal. Judgment and thought content normal.    Prenatal labs: ABO, Rh: --/--/O POS (05/20 0810) Antibody: NEG (05/20 0810) Rubella: 2.08 (10/25 1603) RPR: Non Reactive (03/01 1115)  HBsAg: Negative (10/25 1603)  HIV: Non Reactive (03/01 1115)  GBS: Negative (04/30 1130)   Assessment/Plan: SVe 1/th/post/-2 cytotec induction of labor   Wyvonnia DuskyMarie Daveah Varone 08/03/2016, 9:42 AM

## 2016-08-03 NOTE — Progress Notes (Signed)
Stephannie Lieisha S Jaimes is a 26 y.o. G3P1011 at 2032w0d by ultrasound admitted for induction of labor due to Gestational diabetes.  Subjective:   Objective: BP 100/63   Pulse 94   Temp 98.4 F (36.9 C) (Oral)   Resp 20   Ht 5\' 8"  (1.727 m)   Wt 183 lb (83 kg)   LMP 11/04/2015 (Approximate)   Breastfeeding? Unknown   BMI 27.83 kg/m  No intake/output data recorded. No intake/output data recorded.  FHT:  FHR: 135-140 bpm, variability: moderate,  accelerations:  Present,  decelerations:  Absent UC:   regular, every 2-3 minutes SVE:   Dilation: 4 Effacement (%): 90 Station: -2 Exam by:: [email or web address]  Labs: Lab Results  Component Value Date   WBC 9.9 08/03/2016   HGB 11.5 (L) 08/03/2016   HCT 34.4 (L) 08/03/2016   MCV 81.3 08/03/2016   PLT 175 08/03/2016    Assessment / Plan: Induction of labor due to gestational diabetes,  progressing well on pitocin  Labor: Progressing normally Preeclampsia:  no signs or symptoms of toxicity Fetal Wellbeing:  Category I Pain Control:  Epidural I/D:  n/a Anticipated MOD:  NSVD  Wyvonnia DuskyMarie Lawson 08/03/2016, 5:59 PM

## 2016-08-04 LAB — RPR, QUANT+TP ABS (REFLEX)
Rapid Plasma Reagin, Quant: 1:1 {titer} — ABNORMAL HIGH
T Pallidum Abs: NEGATIVE

## 2016-08-04 LAB — GLUCOSE, CAPILLARY: Glucose-Capillary: 172 mg/dL — ABNORMAL HIGH (ref 65–99)

## 2016-08-04 NOTE — Progress Notes (Signed)
Pt c/o lightheadness. CBG checked via pt request. CBG  172  1.5 hour after breakfast of pancakes with syrup and OJ. Pt to call if she gets up

## 2016-08-04 NOTE — Progress Notes (Signed)
Post Partum Day #1 Subjective: no complaints, up ad lib, voiding and tolerating PO  Objective: Blood pressure 118/70, pulse 71, temperature 98.5 F (36.9 C), temperature source Oral, resp. rate 18, height 5\' 8"  (1.727 m), weight 183 lb (83 kg), last menstrual period 11/04/2015, SpO2 99 %, unknown if currently breastfeeding.  Physical Exam:  General: alert, cooperative and no distress Lochia: appropriate Uterine Fundus: firm Incision: none DVT Evaluation: No evidence of DVT seen on physical exam. No cords or calf tenderness. No significant calf/ankle edema.   Recent Labs  08/03/16 0810  HGB 11.5*  HCT 34.4*    Assessment/Plan: Plan for discharge tomorrow, Breastfeeding and Contraception POP planned   LOS: 1 day   Roe CoombsRachelle A Joni Norrod, CNM 08/04/2016, 7:31 AM

## 2016-08-04 NOTE — Anesthesia Postprocedure Evaluation (Signed)
Anesthesia Post Note  Patient: Kelsey Jones  Procedure(s) Performed: * No procedures listed *  Patient location during evaluation: Mother Baby Anesthesia Type: Epidural Level of consciousness: awake and alert and oriented Pain management: pain level controlled Vital Signs Assessment: post-procedure vital signs reviewed and stable Respiratory status: spontaneous breathing and nonlabored ventilation Cardiovascular status: stable Postop Assessment: no headache, epidural receding, patient able to bend at knees, adequate PO intake, no backache and no signs of nausea or vomiting Anesthetic complications: no        Last Vitals:  Vitals:   08/04/16 0526 08/04/16 1013  BP: 118/70 124/78  Pulse: 71 78  Resp: 18 18  Temp: 36.9 C     Last Pain:  Vitals:   08/04/16 0730  TempSrc:   PainSc: 0-No pain   Pain Goal:                 [name],[name]

## 2016-08-05 MED ORDER — NORETHINDRONE 0.35 MG PO TABS: 1.0000 | ORAL_TABLET | Freq: Every day | ORAL | 11 refills | Status: DC

## 2016-08-05 MED ORDER — IBUPROFEN 600 MG PO TABS: 600.0000 mg | ORAL_TABLET | Freq: Four times a day (QID) | ORAL | 0 refills | Status: DC

## 2016-08-05 NOTE — Lactation Note (Signed)
This note was copied from a baby's chart. Lactation Consultation Note  Patient Name: Kelsey Jones ZOXWR'UToday's Date: 08/05/2016 Reason for consult: Follow-up assessment;Infant weight loss (6% weight loss , pumping and bottle feeding due to baby neonatal tooth )  Baby is 4639 hours old ,  Dad feeding baby a bottle - LC reviewed PACE feeding, and recommended pausing the  Baby intermittently.  Per mom has pumped 3-4 times in the last 24 hours with 15 ml expressed. Mom requested for LC to check breast while LC present, LC noted the breast  To be filling bilaterally with few nodules.  LC assisted to set up the DEBP - checked flanges - #24 for the right breast, #27 for the  Left breast , per mom comfortable. LC had mom use the coconut oil on the nipples and areolas  To pump.  LC recommended using  Coconut oil until soreness improved / no breakdown noted.  Sore nipple and engorgement prevention and tx reviewed. Per mom has a DEBP at home.  LC recommended once the baby's tooth is pulled , [facility]  Mother informed of post-discharge support and given phone number to the lactation department, including services for phone call assistance; out-patient appointments; and breastfeeding support group. List of other breastfeeding resources in the community given in the handout. Encouraged mother to call for problems or concerns related to breastfeeding.   Maternal Data Has patient been taught Hand Expression?: Yes  Feeding Feeding Type: Formula Nipple Type: Slow - flow  LATCH Score/Interventions                      Lactation Tools Discussed/Used Tools: Pump;Flanges (pumping with yield ) Flange Size: Other (comment) (on the right #24 and on the left #27 ) Breast pump type: Double-Electric Breast Pump Pump Review:  (DEBP had already set up )   Consult Status Consult Status: Complete Date: 08/05/16    Kathrin GreathouseMargaret Ann Maricus Tanzi 08/05/2016, 10:58  AM

## 2016-08-05 NOTE — Discharge Instructions (Signed)
Breastfeeding °Deciding to breastfeed is one of the best choices you can make for you and your baby. A change in hormones during pregnancy causes your breast tissue to grow and increases the number and size of your milk ducts. These hormones also allow proteins, sugars, and fats from your blood supply to make breast milk in your milk-producing glands. Hormones prevent breast milk from being released before your baby is born as well as prompt milk flow after birth. Once breastfeeding has begun, thoughts of your baby, as well as his or her sucking or crying, can stimulate the release of milk from your milk-producing glands. °Benefits of breastfeeding °For Your Baby °· Your first milk (colostrum) helps your baby's digestive system function better. °· There are antibodies in your milk that help your baby fight off infections. °· Your baby has a lower incidence of asthma, allergies, and sudden infant death syndrome. °· The nutrients in breast milk are better for your baby than infant formulas and are designed uniquely for your baby’s needs. °· Breast milk improves your baby's brain development. °· Your baby is less likely to develop other conditions, such as childhood obesity, asthma, or type 2 diabetes mellitus. ° °For You °· Breastfeeding helps to create a very special bond between you and your baby. °· Breastfeeding is convenient. Breast milk is always available at the correct temperature and costs nothing. °· Breastfeeding helps to burn calories and helps you lose the weight gained during pregnancy. °· Breastfeeding makes your uterus contract to its prepregnancy size faster and slows bleeding (lochia) after you give birth. °· Breastfeeding helps to lower your risk of developing type 2 diabetes mellitus, osteoporosis, and breast or ovarian cancer later in life. ° °Signs that your baby is hungry °Early Signs of Hunger °· Increased alertness or activity. °· Stretching. °· Movement of the head from side to  side. °· Movement of the head and opening of the mouth when the corner of the mouth or cheek is stroked (rooting). °· Increased sucking sounds, smacking lips, cooing, sighing, or squeaking. °· Hand-to-mouth movements. °· Increased sucking of fingers or hands. ° °Late Signs of Hunger °· Fussing. °· Intermittent crying. ° °Extreme Signs of Hunger °Signs of extreme hunger will require calming and consoling before your baby will be able to breastfeed successfully. Do not wait for the following signs of extreme hunger to occur before you initiate breastfeeding: °· Restlessness. °· A loud, strong cry. °· Screaming. ° °Breastfeeding basics °Breastfeeding Initiation °· Find a comfortable place to sit or lie down, with your neck and back well supported. °· Place a pillow or rolled up blanket under your baby to bring him or her to the level of your breast (if you are seated). Nursing pillows are specially designed to help support your arms and your baby while you breastfeed. °· Make sure that your baby's abdomen is facing your abdomen. °· Gently massage your breast. With your fingertips, massage from your chest wall toward your nipple in a circular motion. This encourages milk flow. You may need to continue this action during the feeding if your milk flows slowly. °· Support your breast with 4 fingers underneath and your thumb above your nipple. Make sure your fingers are well away from your nipple and your baby’s mouth. °· Stroke your baby's lips gently with your finger or nipple. °· When your baby's mouth is open wide enough, quickly bring your baby to your breast, placing your entire nipple and as much of the colored area   around your nipple (areola) as possible into your baby's mouth. °? More areola should be visible above your baby's upper lip than below the lower lip. °? Your baby's tongue should be between his or her lower gum and your breast. °· Ensure that your baby's mouth is correctly positioned around your nipple  (latched). Your baby's lips should create a seal on your breast and be turned out (everted). °· It is common for your baby to suck about 2-3 minutes in order to start the flow of breast milk. ° °Latching °Teaching your baby how to latch on to your breast properly is very important. An improper latch can cause nipple pain and decreased milk supply for you and poor weight gain in your baby. Also, if your baby is not latched onto your nipple properly, he or she may swallow some air during feeding. This can make your baby fussy. Burping your baby when you switch breasts during the feeding can help to get rid of the air. However, teaching your baby to latch on properly is still the best way to prevent fussiness from swallowing air while breastfeeding. °Signs that your baby has successfully latched on to your nipple: °· Silent tugging or silent sucking, without causing you pain. °· Swallowing heard between every 3-4 sucks. °· Muscle movement above and in front of his or her ears while sucking. ° °Signs that your baby has not successfully latched on to nipple: °· Sucking sounds or smacking sounds from your baby while breastfeeding. °· Nipple pain. ° °If you think your baby has not latched on correctly, slip your finger into the corner of your baby’s mouth to break the suction and place it between your baby's gums. Attempt breastfeeding initiation again. °Signs of Successful Breastfeeding °Signs from your baby: °· A gradual decrease in the number of sucks or complete cessation of sucking. °· Falling asleep. °· Relaxation of his or her body. °· Retention of a small amount of milk in his or her mouth. °· Letting go of your breast by himself or herself. ° °Signs from you: °· Breasts that have increased in firmness, weight, and size 1-3 hours after feeding. °· Breasts that are softer immediately after breastfeeding. °· Increased milk volume, as well as a change in milk consistency and color by the fifth day of  breastfeeding. °· Nipples that are not sore, cracked, or bleeding. ° °Signs That Your Baby is Getting Enough Milk °· Wetting at least 1-2 diapers during the first 24 hours after birth. °· Wetting at least 5-6 diapers every 24 hours for the first week after birth. The urine should be clear or pale yellow by 5 days after birth. °· Wetting 6-8 diapers every 24 hours as your baby continues to grow and develop. °· At least 3 stools in a 24-hour period by age 5 days. The stool should be soft and yellow. °· At least 3 stools in a 24-hour period by age 7 days. The stool should be seedy and yellow. °· No loss of weight greater than 10% of birth weight during the first 3 days of age. °· Average weight gain of 4-7 ounces (113-198 g) per week after age 4 days. °· Consistent daily weight gain by age 5 days, without weight loss after the age of 2 weeks. ° °After a feeding, your baby may spit up a small amount. This is common. °Breastfeeding frequency and duration °Frequent feeding will help you make more milk and can prevent sore nipples and breast engorgement. Breastfeed when   you feel the need to reduce the fullness of your breasts or when your baby shows signs of hunger. This is called "breastfeeding on demand." Avoid introducing a pacifier to your baby while you are working to establish breastfeeding (the first 4-6 weeks after your baby is born). After this time you may choose to use a pacifier. Research has shown that pacifier use during the first year of a baby's life decreases the risk of sudden infant death syndrome (SIDS). °Allow your baby to feed on each breast as long as he or she wants. Breastfeed until your baby is finished feeding. When your baby unlatches or falls asleep while feeding from the first breast, offer the second breast. Because newborns are often sleepy in the first few weeks of life, you may need to awaken your baby to get him or her to feed. °Breastfeeding times will vary from baby to baby. However,  the following rules can serve as a guide to help you ensure that your baby is properly fed: °· Newborns (babies 4 weeks of age or younger) may breastfeed every 1-3 hours. °· Newborns should not go longer than 3 hours during the day or 5 hours during the night without breastfeeding. °· You should breastfeed your baby a minimum of 8 times in a 24-hour period until you begin to introduce solid foods to your baby at around 6 months of age. ° °Breast milk pumping °Pumping and storing breast milk allows you to ensure that your baby is exclusively fed your breast milk, even at times when you are unable to breastfeed. This is especially important if you are going back to work while you are still breastfeeding or when you are not able to be present during feedings. Your lactation consultant can give you guidelines on how long it is safe to store breast milk. °A breast pump is a machine that allows you to pump milk from your breast into a sterile bottle. The pumped breast milk can then be stored in a refrigerator or freezer. Some breast pumps are operated by hand, while others use electricity. Ask your lactation consultant which type will work best for you. Breast pumps can be purchased, but some hospitals and breastfeeding support groups lease breast pumps on a monthly basis. A lactation consultant can teach you how to hand express breast milk, if you prefer not to use a pump. °Caring for your breasts while you breastfeed °Nipples can become dry, cracked, and sore while breastfeeding. The following recommendations can help keep your breasts moisturized and healthy: °· Avoid using soap on your nipples. °· Wear a supportive bra. Although not required, special nursing bras and tank tops are designed to allow access to your breasts for breastfeeding without taking off your entire bra or top. Avoid wearing underwire-style bras or extremely tight bras. °· Air dry your nipples for 3-4 minutes after each feeding. °· Use only cotton  bra pads to absorb leaked breast milk. Leaking of breast milk between feedings is normal. °· Use lanolin on your nipples after breastfeeding. Lanolin helps to maintain your skin's normal moisture barrier. If you use pure lanolin, you do not need to wash it off before feeding your baby again. Pure lanolin is not toxic to your baby. You may also hand express a few drops of breast milk and gently massage that milk into your nipples and allow the milk to air dry. ° °In the first few weeks after giving birth, some women experience extremely full breasts (engorgement). Engorgement can make your   breasts feel heavy, warm, and tender to the touch. Engorgement peaks within 3-5 days after you give birth. The following recommendations can help ease engorgement: °· Completely empty your breasts while breastfeeding or pumping. You may want to start by applying warm, moist heat (in the shower or with warm water-soaked hand towels) just before feeding or pumping. This increases circulation and helps the milk flow. If your baby does not completely empty your breasts while breastfeeding, pump any extra milk after he or she is finished. °· Wear a snug bra (nursing or regular) or tank top for 1-2 days to signal your body to slightly decrease milk production. °· Apply ice packs to your breasts, unless this is too uncomfortable for you. °· Make sure that your baby is latched on and positioned properly while breastfeeding. ° °If engorgement persists after 48 hours of following these recommendations, contact your health care provider or a lactation consultant. °Overall health care recommendations while breastfeeding °· Eat healthy foods. Alternate between meals and snacks, eating 3 of each per day. Because what you eat affects your breast milk, some of the foods may make your baby more irritable than usual. Avoid eating these foods if you are sure that they are negatively affecting your baby. °· Drink milk, fruit juice, and water to  satisfy your thirst (about 10 glasses a day). °· Rest often, relax, and continue to take your prenatal vitamins to prevent fatigue, stress, and anemia. °· Continue breast self-awareness checks. °· Avoid chewing and smoking tobacco. Chemicals from cigarettes that pass into breast milk and exposure to secondhand smoke may harm your baby. °· Avoid alcohol and drug use, including marijuana. °Some medicines that may be harmful to your baby can pass through breast milk. It is important to ask your health care provider before taking any medicine, including all over-the-counter and prescription medicine as well as vitamin and herbal supplements. °It is possible to become pregnant while breastfeeding. If birth control is desired, ask your health care provider about options that will be safe for your baby. °Contact a health care provider if: °· You feel like you want to stop breastfeeding or have become frustrated with breastfeeding. °· You have painful breasts or nipples. °· Your nipples are cracked or bleeding. °· Your breasts are red, tender, or warm. °· You have a swollen area on either breast. °· You have a fever or chills. °· You have nausea or vomiting. °· You have drainage other than breast milk from your nipples. °· Your breasts do not become full before feedings by the fifth day after you give birth. °· You feel sad and depressed. °· Your baby is too sleepy to eat well. °· Your baby is having trouble sleeping. °· Your baby is wetting less than 3 diapers in a 24-hour period. °· Your baby has less than 3 stools in a 24-hour period. °· Your baby's skin or the white part of his or her eyes becomes yellow. °· Your baby is not gaining weight by 5 days of age. °Get help right away if: °· Your baby is overly tired (lethargic) and does not want to wake up and feed. °· Your baby develops an unexplained fever. °This information is not intended to replace advice given to you by your health care provider. Make sure you discuss  any questions you have with your health care provider. °Document Released: 03/03/2005 Document Revised: 08/15/2015 Document Reviewed: 08/25/2012 °Elsevier Interactive Patient Education © 2017 [employer] °Home Care Instructions for Mom °ACTIVITY °·   Gradually return to your regular activities. °· Let yourself rest. Nap while your baby sleeps. °· Avoid lifting anything that is heavier than 10 lb (4.5 kg) until your health care provider says it is okay. °· Avoid activities that take a lot of effort and energy (are strenuous) until approved by your health care provider. Walking at a slow-to-moderate pace is usually safe. °· If you had a cesarean delivery: °? Do not vacuum, climb stairs, or drive a car for 4-6 weeks. °? Have someone help you at home until you feel like you can do your usual activities yourself. °? Do exercises as told by your health care provider, if this applies. ° °VAGINAL BLEEDING °You may continue to bleed for 4-6 weeks after delivery. Over time, the amount of blood usually decreases and the color of the blood usually gets lighter. However, the flow of bright red blood may increase if you have been too active. If you need to use more than one pad in an hour because your pad gets soaked, or if you pass a large clot: °· Lie down. °· Raise your feet. °· Place a cold compress on your lower abdomen. °· Rest. °· Call your health care provider. ° °If you are breastfeeding, your period should return anytime between 8 weeks after delivery and the time that you stop breastfeeding. If you are not breastfeeding, your period should return 6-8 weeks after delivery. °PERINEAL CARE °The perineal area, or perineum, is the part of your body between your thighs. After delivery, this area needs special care. Follow these instructions as told by your health care provider. °· Take warm tub baths for 15-20 minutes. °· Use medicated pads and pain-relieving sprays and creams as told. °· Do not use tampons or douches until  vaginal bleeding has stopped. °· Each time you go to the bathroom: °? Use a peri bottle. °? Change your pad. °? Use towelettes in place of toilet paper until your stitches have healed. °· Do Kegel exercises every day. Kegel exercises help to maintain the muscles that support the vagina, bladder, and bowels. You can do these exercises while you are standing, sitting, or lying down. To do Kegel exercises: °? Tighten the muscles of your abdomen and the muscles that surround your birth canal. °? Hold for a few seconds. °? Relax. °? Repeat until you have done this 5 times in a row. °· To prevent hemorrhoids from developing or getting worse: °? Drink enough fluid to keep your urine clear or pale yellow. °? Avoid straining when having a bowel movement. °? Take over-the-counter medicines and stool softeners as told by your health care provider. ° °BREAST CARE °· Wear a tight-fitting bra. °· Avoid taking over-the-counter pain medicine for breast discomfort. °· Apply ice to the breasts to help with discomfort as needed: °? Put ice in a plastic bag. °? Place a towel between your skin and the bag. °? Leave the ice on for 20 minutes or as told by your health care provider. ° °NUTRITION °· Eat a well-balanced diet. °· Do not try to lose weight quickly by cutting back on calories. °· Take your prenatal vitamins until your postpartum checkup or until your health care provider tells you to stop. ° °POSTPARTUM DEPRESSION °You may find yourself crying for no apparent reason and unable to cope with all of the changes that come with having a newborn. This mood is called postpartum depression. Postpartum depression happens because your hormone levels change after delivery. If you have   postpartum depression, get support from your partner, friends, and family. If the depression does not go away on its own after several weeks, contact your health care provider. °BREAST SELF-EXAM °Do a breast self-exam each month, at the same time of the  month. If you are breastfeeding, check your breasts just after a feeding, when your breasts are less full. If you are breastfeeding and your period has started, check your breasts on day 5, 6, or 7 of your period. °Report any lumps, bumps, or discharge to your health care provider. Know that breasts are normally lumpy if you are breastfeeding. This is temporary, and it is not a health risk. °INTIMACY AND SEXUALITY °Avoid sexual activity for at least 3-4 weeks after delivery or until the brownish-red vaginal flow is completely gone. If you want to avoid pregnancy, use some form of birth control. You can get pregnant after delivery, even if you have not had your period. °SEEK MEDICAL CARE IF: °· You feel unable to cope with the changes that a child brings to your life, and these feelings do not go away after several weeks. °· You notice a lump, a bump, or discharge on your breast. ° °SEEK IMMEDIATE MEDICAL CARE IF: °· Blood soaks your pad in 1 hour or less. °· You have: °? Severe pain or cramping in your lower abdomen. °? A bad-smelling vaginal discharge. °? A fever that is not controlled by medicine. °? A fever, and an area of your breast is red and sore. °? Pain or redness in your calf. °? Sudden, severe chest pain. °? Shortness of breath. °? Painful or bloody urination. °? Problems with your vision. °· You vomit for 12 hours or longer. °· You develop a severe headache. °· You have serious thoughts about hurting yourself, your child, or anyone else. ° °This information is not intended to replace advice given to you by your health care provider. Make sure you discuss any questions you have with your health care provider. °Document Released: 02/29/2000 Document Revised: 08/09/2015 Document Reviewed: 09/04/2014 °Elsevier Interactive Patient Education © 2017 [employer] ° °

## 2016-08-05 NOTE — Discharge Summary (Signed)
OB Discharge Summary  Patient Name: Kelsey Jones [facility] DOB: [birth year] MRN: [id number]  Date of admission: 08/03/2016 Delivering MD: Wyvonnia Dusky D   Date of discharge: 08/05/2016  Admitting diagnosis: 39WKS INDUCTION  Intrauterine pregnancy: [redacted]w[redacted]d     Secondary diagnosis:Active Problems:   Gestational diabetes  Additional problems: none     Discharge diagnosis: Term Pregnancy Delivered                                                                      Augmentation: Pitocin and Cytotec  Complications: None  Hospital course:  Induction of Labor With Vaginal Delivery   26 y.o. yo [id number] at [redacted]w[redacted]d was admitted to the hospital 08/03/2016 for induction of labor.  Indication for induction: A2 DM.  Patient had an uncomplicated labor course as follows: Membrane Rupture Time/Date: 6:48 PM ,08/03/2016   Intrapartum Procedures: Episiotomy: None [1]                                         Lacerations:  None [1]  Patient had delivery of a Viable infant.  Information for the patient's newborn:  [place], Girl Tylyn [[id number]]  Delivery Method: Vaginal, Spontaneous Delivery (Filed from Delivery Summary)   08/03/2016  Details of delivery can be found in separate delivery note.  Patient had a routine postpartum course. Patient is discharged home 08/05/16.  Physical exam  Vitals:   08/04/16 0526 08/04/16 1013 08/04/16 1736 08/05/16 0558  BP: 118/70 124/78 113/68 (!) 110/56  Pulse: 71 78 84 70  Resp: 18 18 18 16   Temp: 98.5 F (36.9 C)  98.1 F (36.7 C) 98.7 F (37.1 C)  TempSrc: Oral  Oral Oral  SpO2: 99%     Weight:      Height:       General: alert Lochia: appropriate Uterine Fundus: firm Incision: N/A DVT Evaluation: No evidence of DVT seen on physical exam. Labs: Lab Results  Component Value Date   WBC 9.9 08/03/2016   HGB 11.5 (L) 08/03/2016   HCT 34.4 (L) 08/03/2016   MCV 81.3 08/03/2016   PLT 175 08/03/2016   CMP Latest Ref Rng & Units 02/25/2016   Glucose 65 - 99 mg/dL 130(Q)  BUN 6 - 20 mg/dL 7  Creatinine 6.57 - 8.46 mg/dL 9.62(X)  Sodium 528 - 413 mmol/L 133(L)  Potassium 3.5 - 5.1 mmol/L 3.5  Chloride 101 - 111 mmol/L 102  CO2 22 - 32 mmol/L 22  Calcium 8.9 - 10.3 mg/dL 9.2  Total Protein 6.5 - 8.1 g/dL 7.0  Total Bilirubin 0.3 - 1.2 mg/dL 0.4  Alkaline Phos 38 - 126 U/L 41  AST 15 - 41 U/L 35  ALT 14 - 54 U/L 47    Discharge instruction: per After Visit Summary and "Baby and Me Booklet".  After Visit Meds:  Allergies as of 08/05/2016   No Known Allergies     Medication List    STOP taking these medications   HUMALOG KWIKPEN 100 UNIT/ML KiwkPen Generic drug:  insulin lispro   insulin NPH Human 100 UNIT/ML injection Commonly known as:  HUMULIN N,NOVOLIN N  TAKE these medications   docusate sodium 100 MG capsule Commonly known as:  COLACE Take 1 capsule (100 mg total) by mouth 2 (two) times daily as needed. What changed:  reasons to take this   ibuprofen 600 MG tablet Commonly known as:  ADVIL,MOTRIN Take 1 tablet (600 mg total) by mouth every 6 (six) hours.   norethindrone 0.35 MG tablet Commonly known as:  MICRONOR,CAMILA,ERRIN Take 1 tablet (0.35 mg total) by mouth daily.   OB COMPLETE PETITE 35-5-1-200 MG Caps Take 1 capsule by mouth daily.   Vitamin D (Ergocalciferol) 50000 units Caps capsule Commonly known as:  DRISDOL Take 1 capsule (50,000 Units total) by mouth every 7 (seven) days. What changed:  when to take this       Diet: carb modified diet  Activity: Advance as tolerated. Pelvic rest for 6 weeks.   Outpatient follow up:4 weeks Follow up Appt:No future appointments. Follow up visit: No Follow-up on file.  Postpartum contraception: Progesterone only pills  Newborn Data: Live born female  Birth Weight: 8 lb 3.6 oz (3731 g) APGAR: 9, 9  Baby Feeding: Breast Disposition:home with mother   08/05/2016 Allie BossierMyra C Amal Saiki, MD

## 2016-08-06 LAB — RPR: RPR: NONREACTIVE

## 2016-10-23 ENCOUNTER — Telehealth: Payer: Self-pay | Admitting: *Deleted

## 2016-10-23 NOTE — Telephone Encounter (Signed)
Pt called to office inquiring if she can use Detox tea while breastfeeding.  Reviewed with Boykin Reaperachelle, she does not recommend while nursing.  Attempt to contact pt. No answer, no VM.

## 2017-11-28 IMAGING — US US OB LIMITED
1 series · 14 of 28 positions shown · non-contrast
Comparison: none

CLINICAL DATA: Abdominal pain for 1 day.  Pregnant patient.

EXAM:
LIMITED OBSTETRIC ULTRASOUND

[Series 1: us ob limited · 0.26mm/px · 33 acquisitions, 14 frames shown]
[im 2/33]
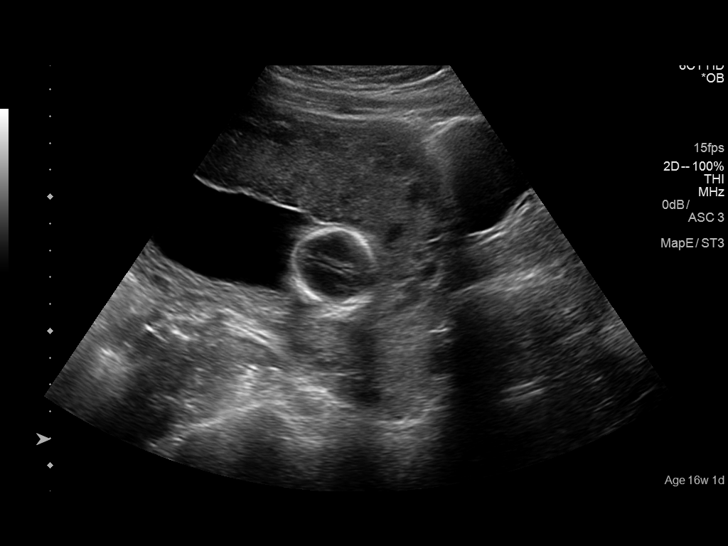
[im 4/33]
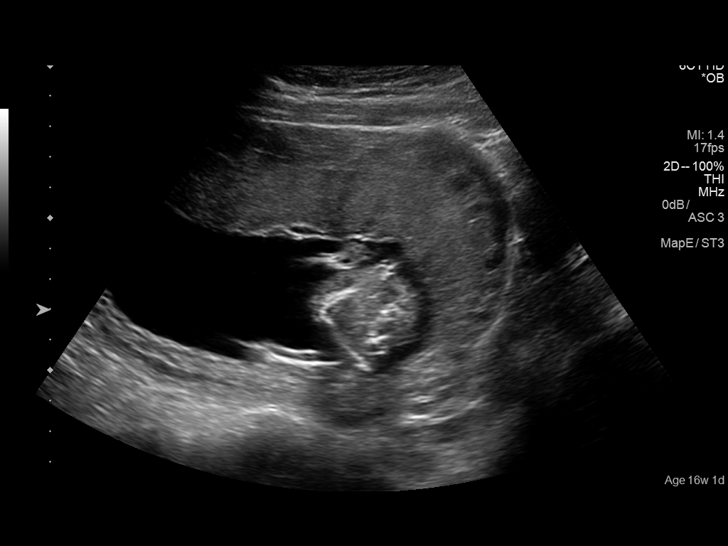
[im 6/33]
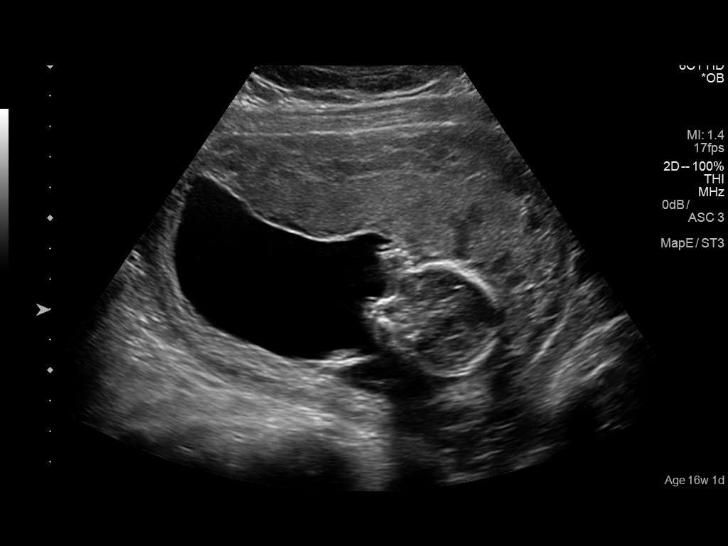
[im 9/33]
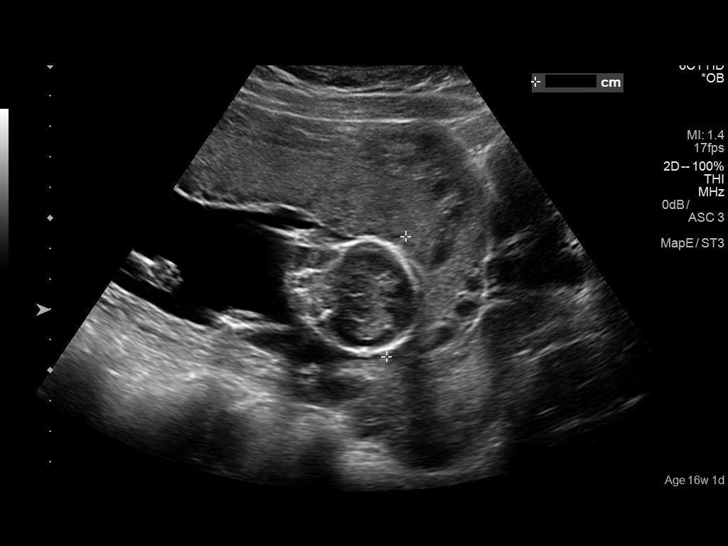
[im 11/33]
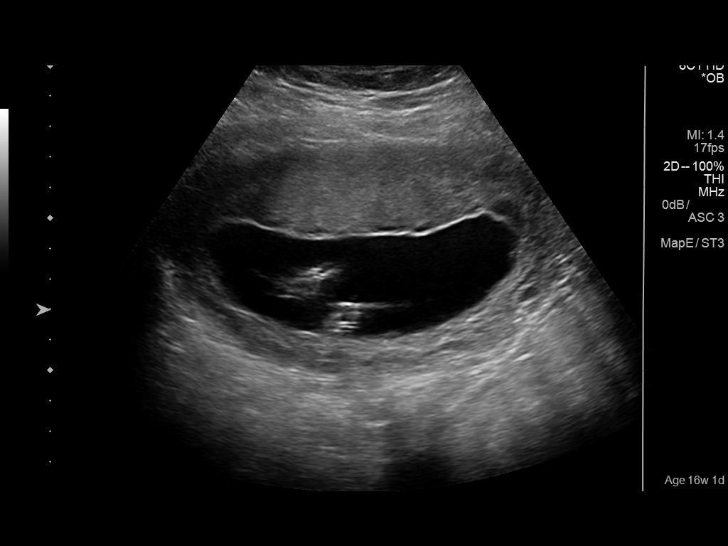
[im 14/33]
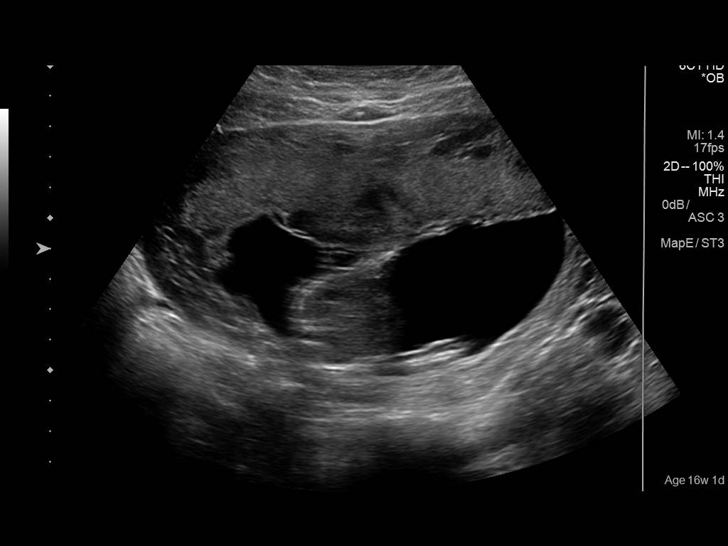
[im 16/33]
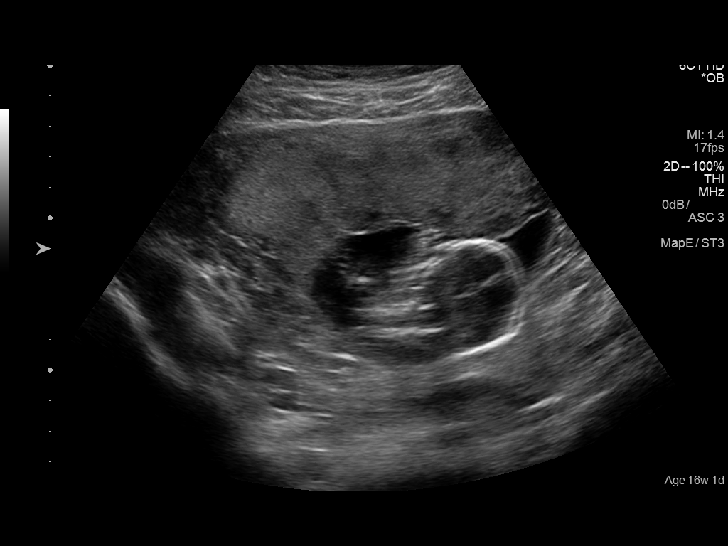
[im 18/33]
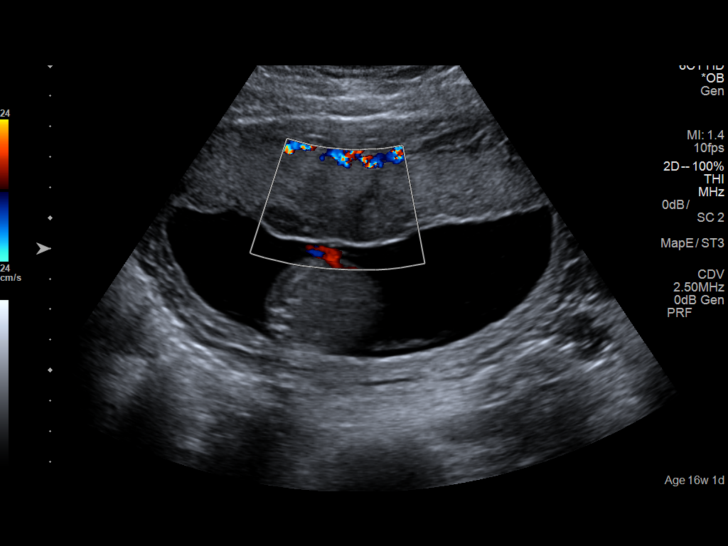
[im 21/33]
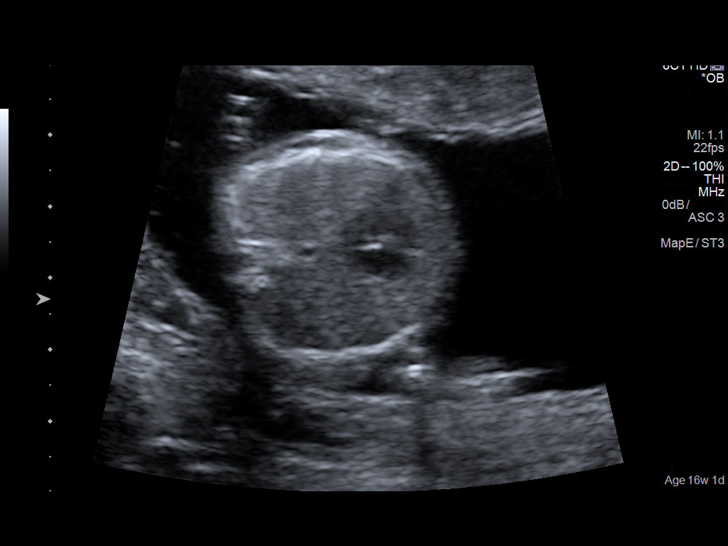
[im 23/33]
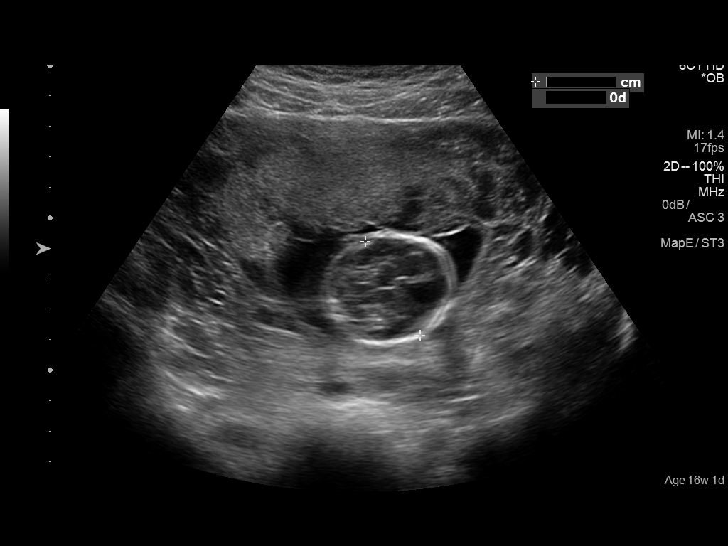
[im 25/33]
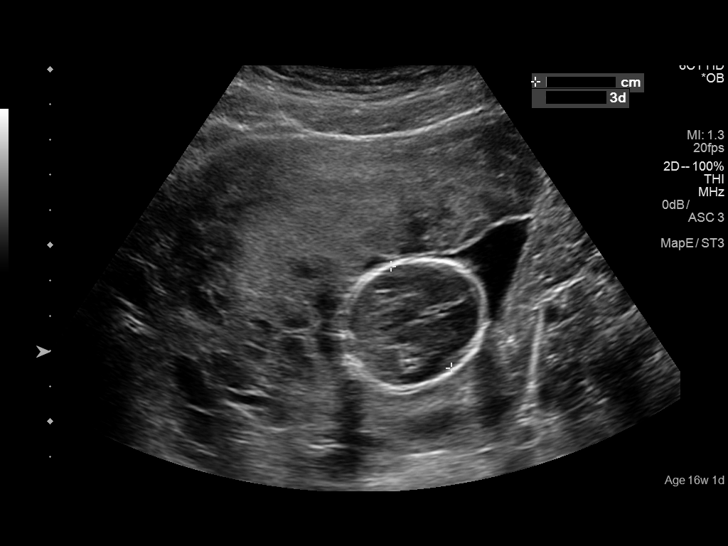
[im 28/33]
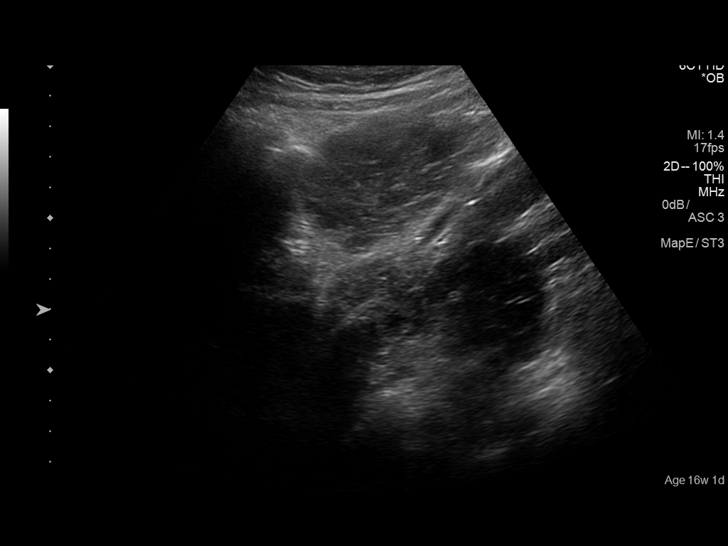
[im 30/33]
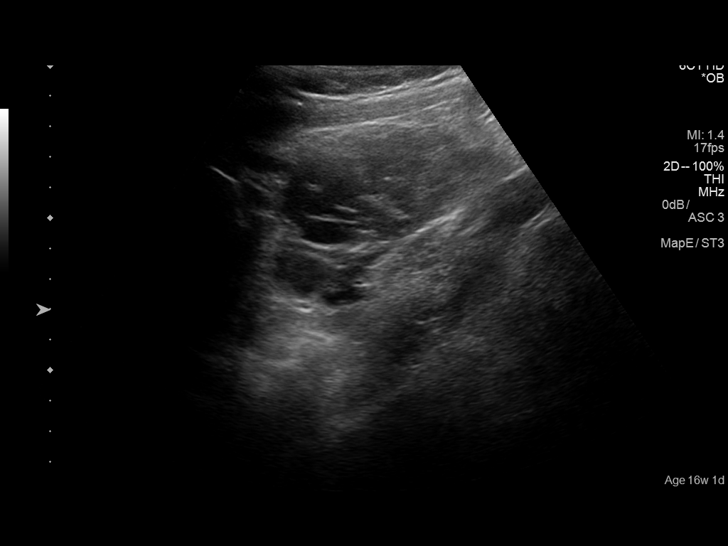
[im 33/33]
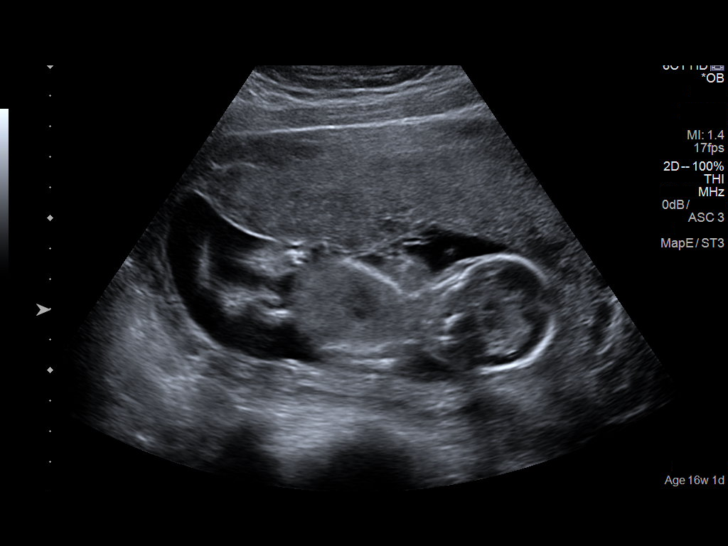

[14 of 28 positions shown; findings below may reference images not displayed]

FINDINGS: Number of Fetuses: 1

Heart Rate:  165 bpm

Movement: Yes

Presentation: Cephalic

Placental Location: Anterior

Previa: No

Amniotic Fluid (Subjective):  Within normal limits.

BPD:  3.5cm 16w  5d

MATERNAL FINDINGS:

Cervix:  Appears closed.

Uterus/Adnexae:  No abnormality visualized.
IMPRESSION: 1. Single live intrauterine pregnancy with a measured gestational
age of 16 weeks and 5 days, showing normal interval growth since the
prior exam.
2. No pregnancy complication or from the emergent maternal
abnormality.

This exam is performed on an emergent basis and does not
comprehensively evaluate fetal size, dating, or anatomy; follow-up
complete OB US should be considered if further fetal assessment is
warranted.

## 2018-01-15 IMAGING — US US OB TRANSVAGINAL
1 series · 15 of 28 positions shown · non-contrast
Comparison: 02/14/2010

CLINICAL DATA: Vaginal bleeding and cramping.  Beta HCG of [DATE].

EXAM:
OBSTETRIC <14 WK US AND TRANSVAGINAL OB US
TECHNIQUE: Both transabdominal and transvaginal ultrasound examinations were
performed for complete evaluation of the gestation as well as the
maternal uterus, adnexal regions, and pelvic cul-de-sac.
Transvaginal technique was performed to assess early pregnancy.

[Series 1: us ob transvaginal · 15 of 118 slices shown]
[im 1/118]
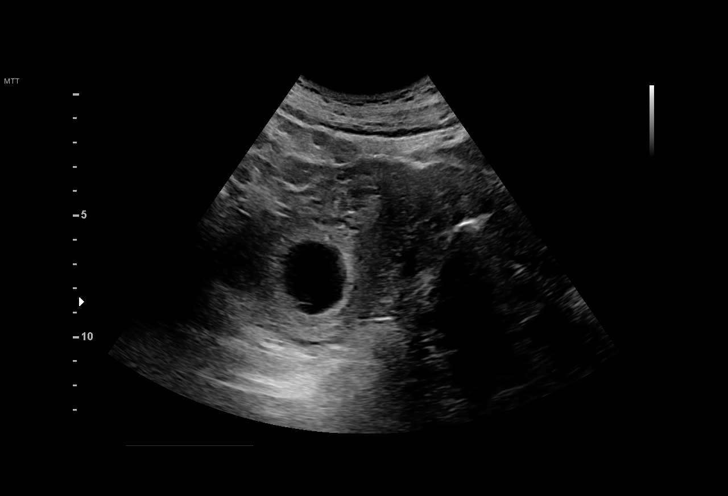
[im 9/118]
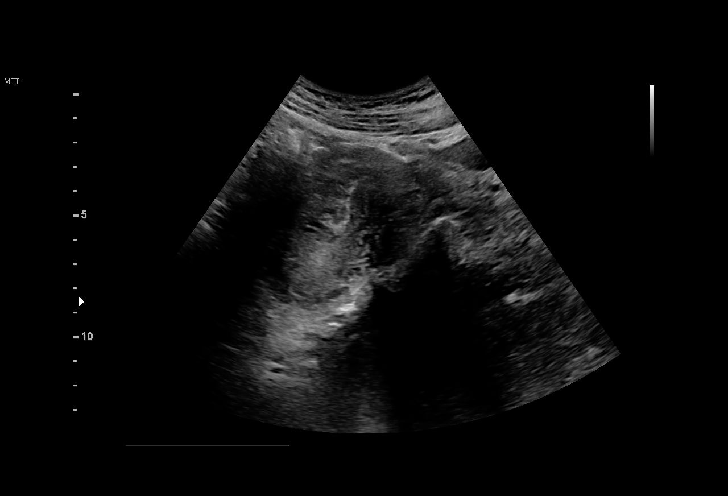
[im 18/118]
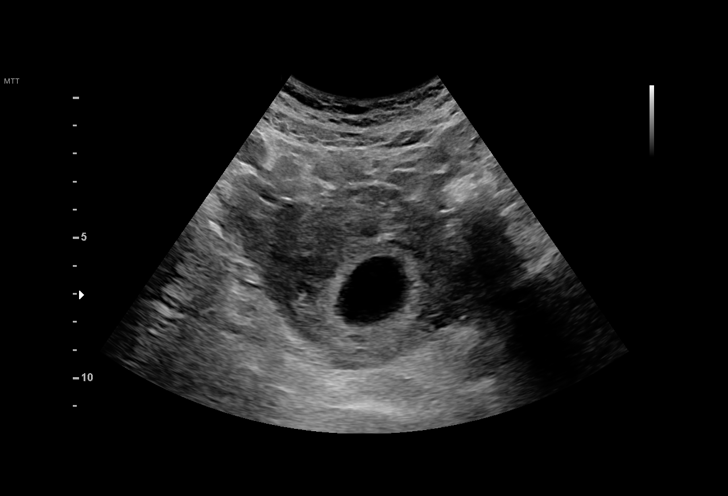
[im 27/118]
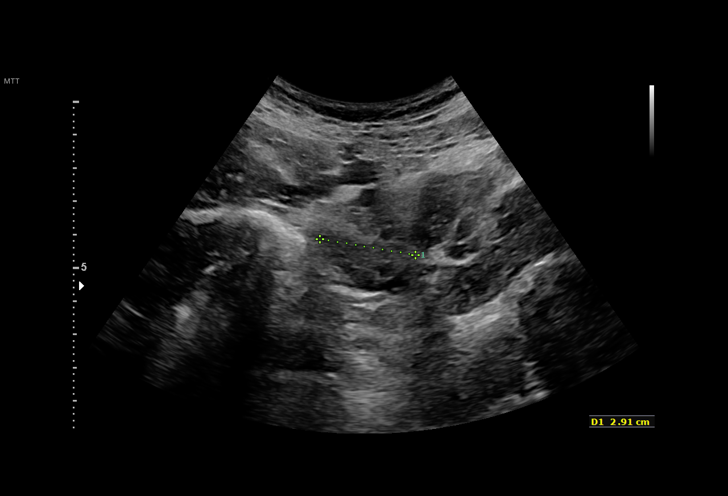
[im 35/118]
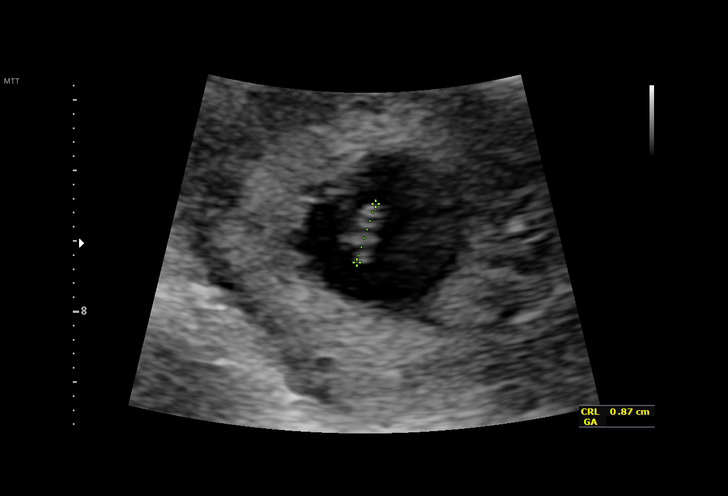
[im 44/118]
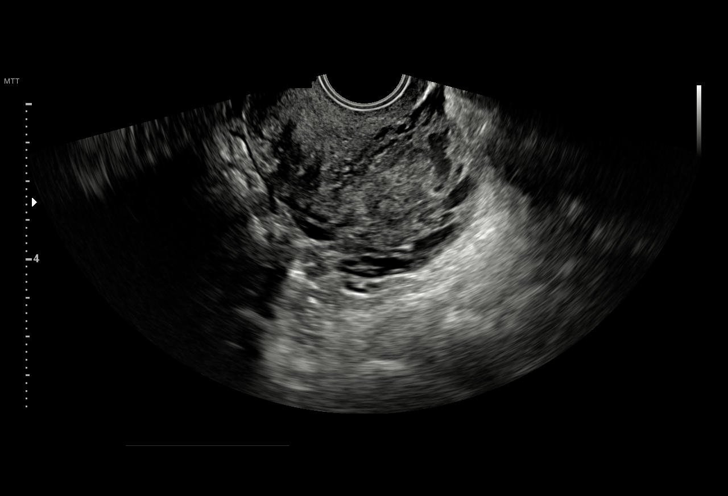
[im 53/118]
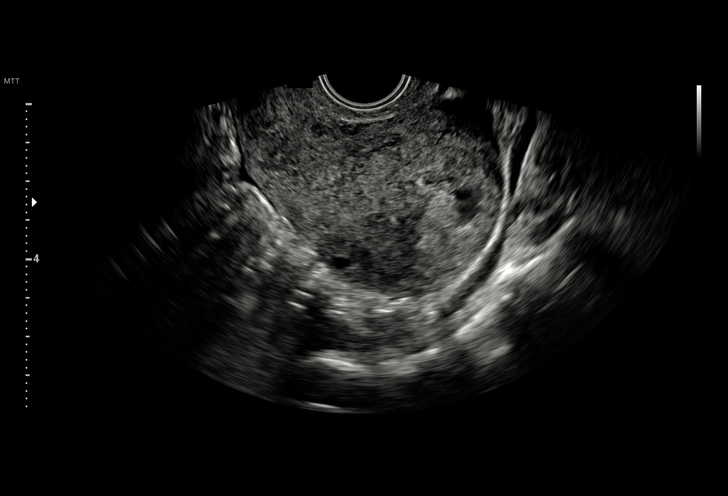
[im 61/118]
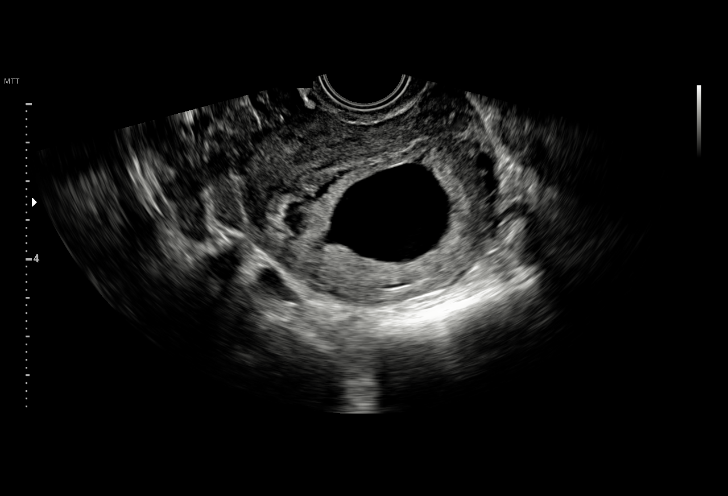
[im 66/118]
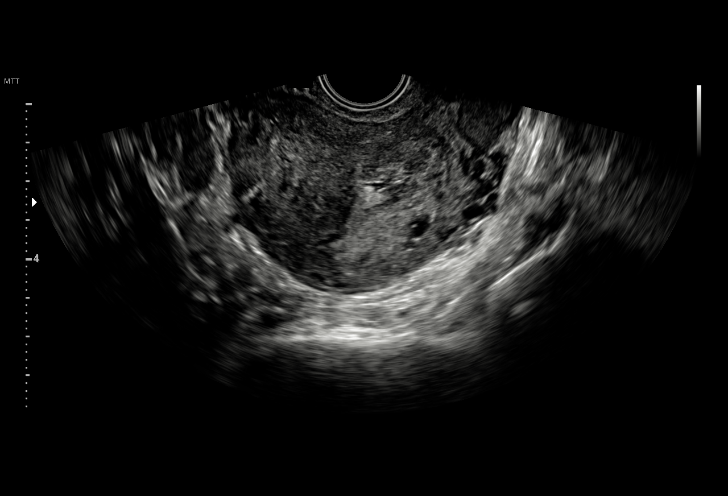
[im 74/118]
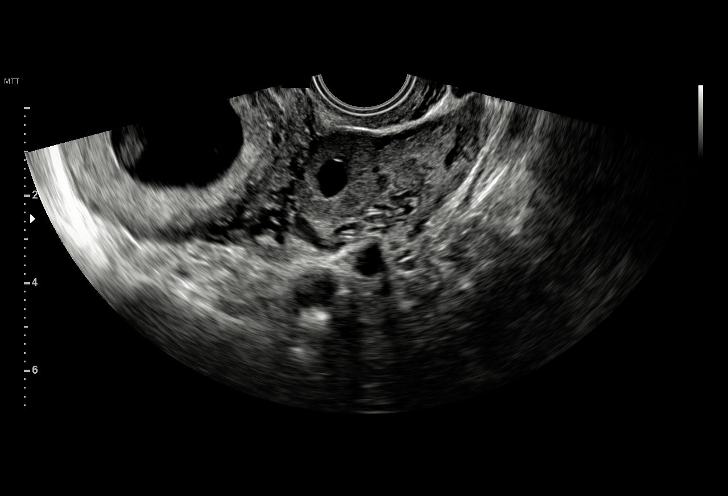
[im 83/118]
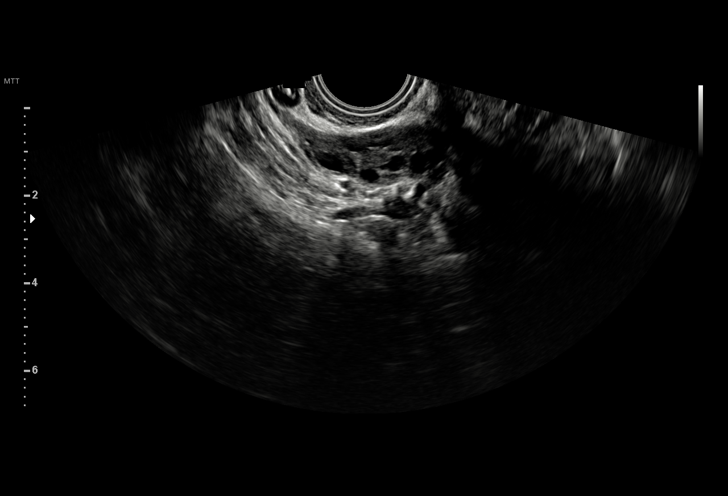
[im 92/118]
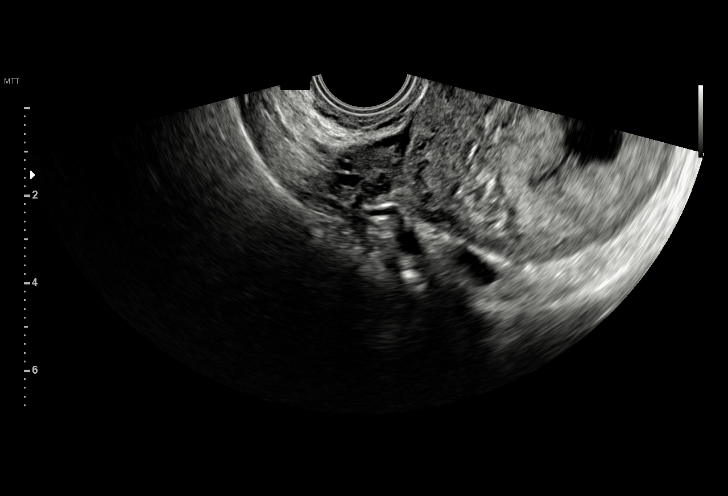
[im 100/118]
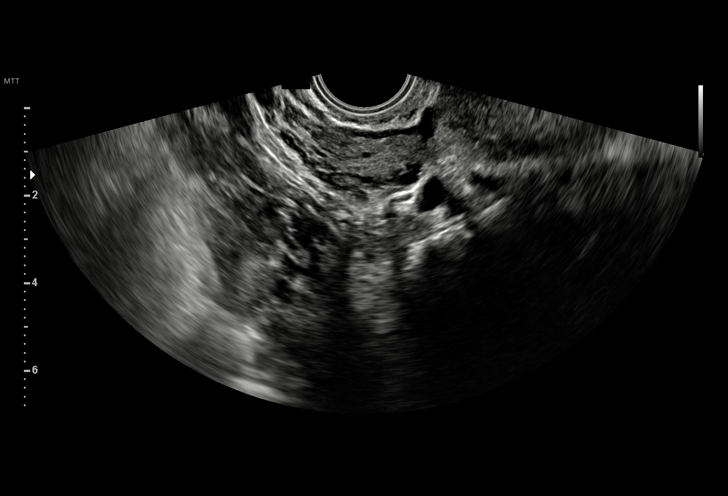
[im 109/118]
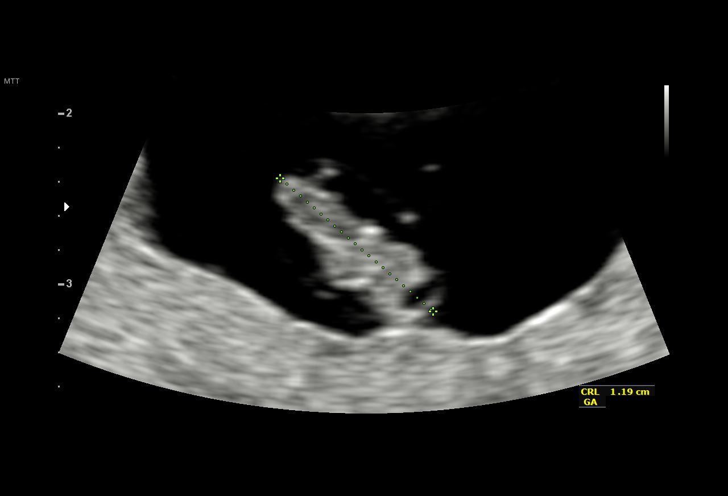
[im 118/118]
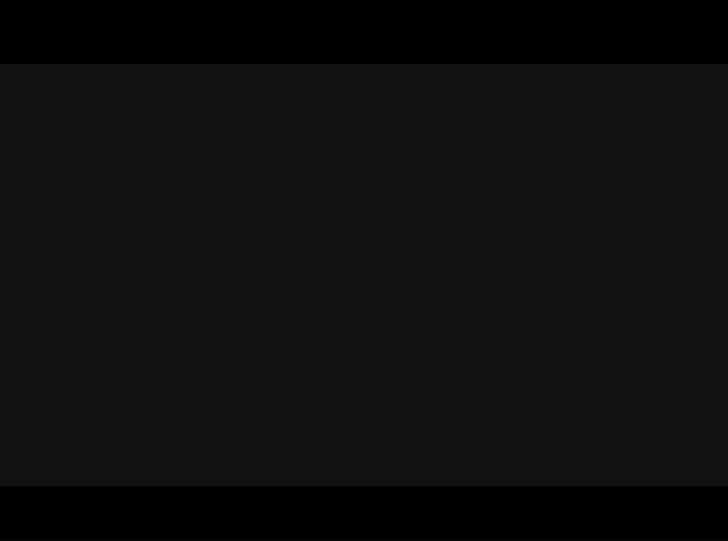

[15 of 28 positions shown; findings below may reference images not displayed]

FINDINGS: Intrauterine gestational sac: Present

Yolk sac:  Present

Embryo:  Present

Cardiac Activity: Present

Heart Rate: 132  bpm

CRL:  11.9  Mm   7 w   2 d                  US EDC: 08/09/2016

Subchorionic hemorrhage:  Small volume.  Primarily right-sided.

Maternal uterus/adnexae: Left ovarian corpus luteal cyst. Normal
right ovary. Trace free pelvic fluid is likely physiologic.
IMPRESSION: 1. Intrauterine gestational of approximately 7 weeks 2 days with
fetal heart rate of 132 beats per minute.
2. Small subchronic hemorrhage.
3. Left ovarian corpus luteal cyst.

## 2018-06-21 IMAGING — US US MFM OB FOLLOW-UP
1 series · 14 of 28 positions shown · non-contrast
Comparison: none

[Series 1: us mfm ob follow-up · 52 acquisitions, 14 frames shown]
[im 2/52]
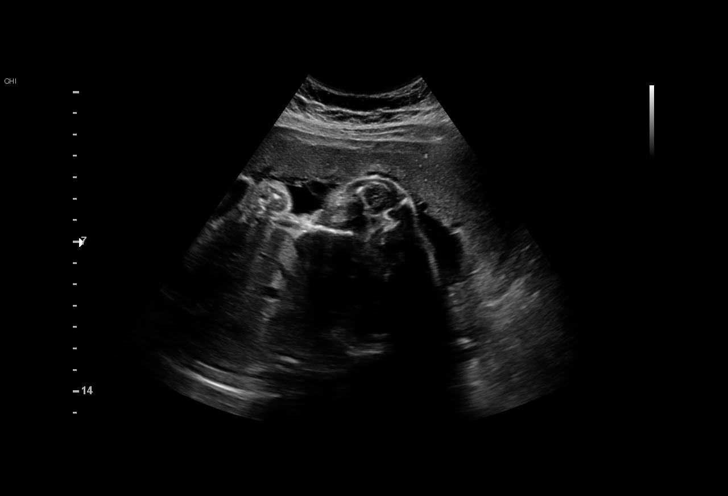
[im 6/52]
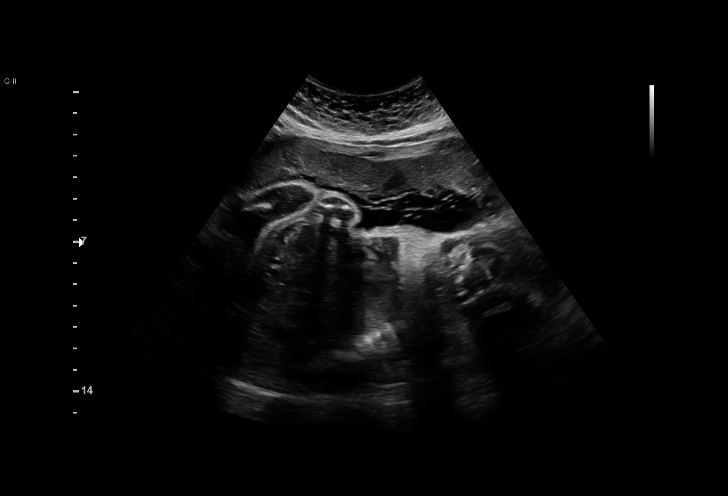
[im 10/52]
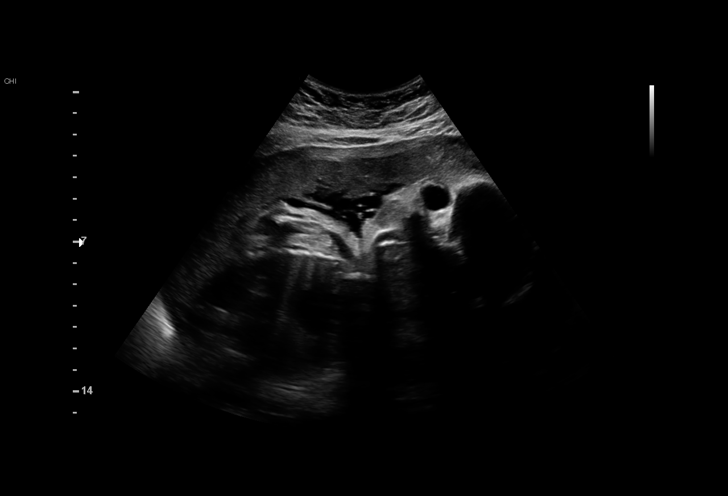
[im 14/52]
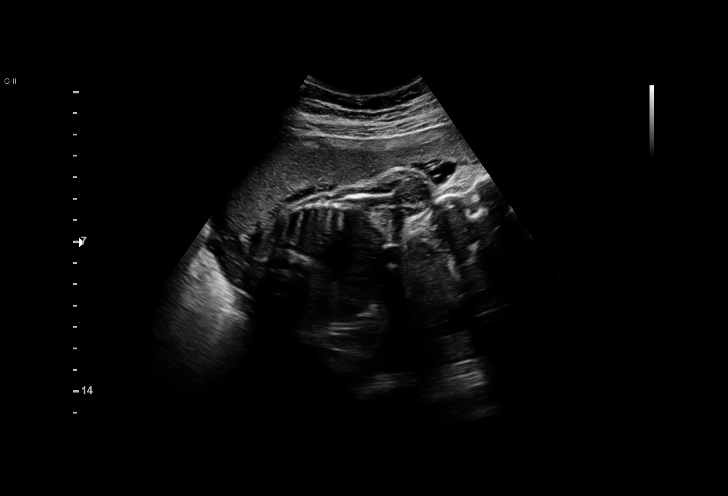
[im 18/52]
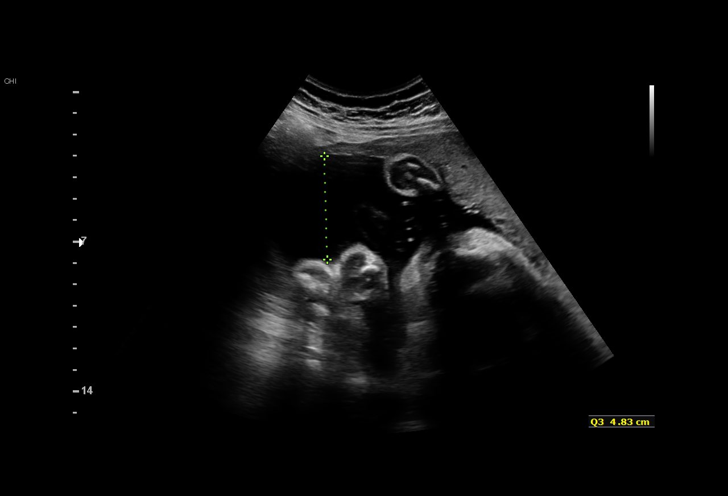
[im 21/52]
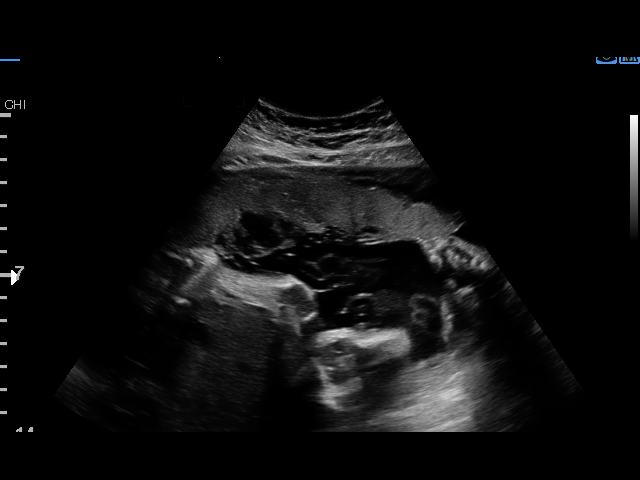
[im 25/52]
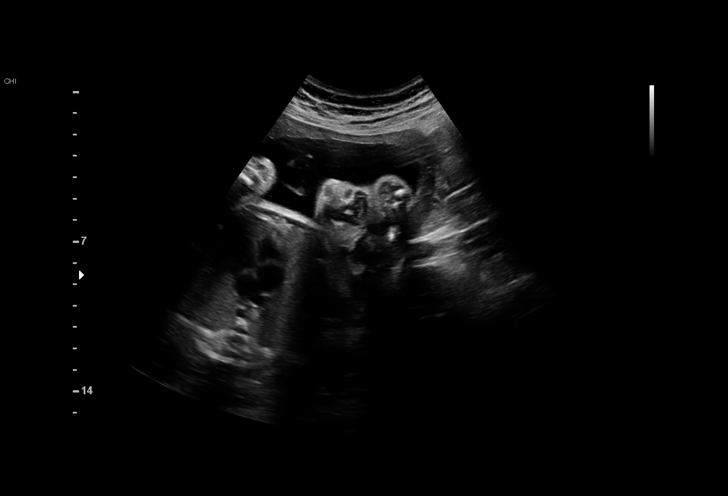
[im 29/52]
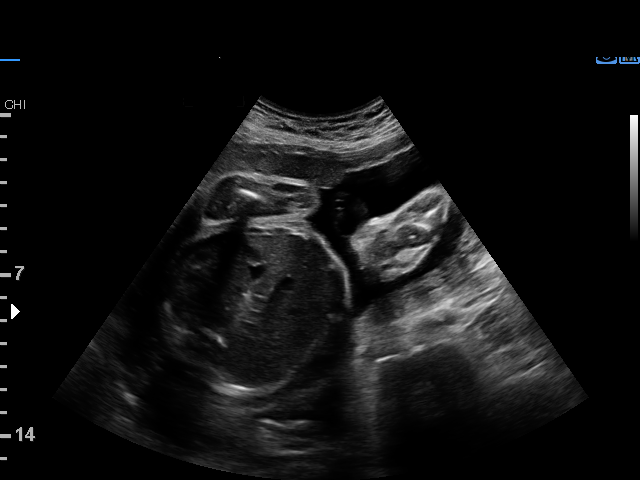
[im 33/52]
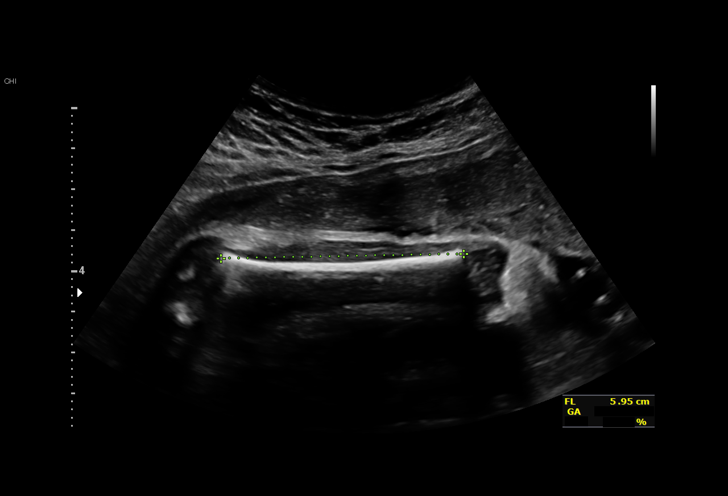
[im 36/52]
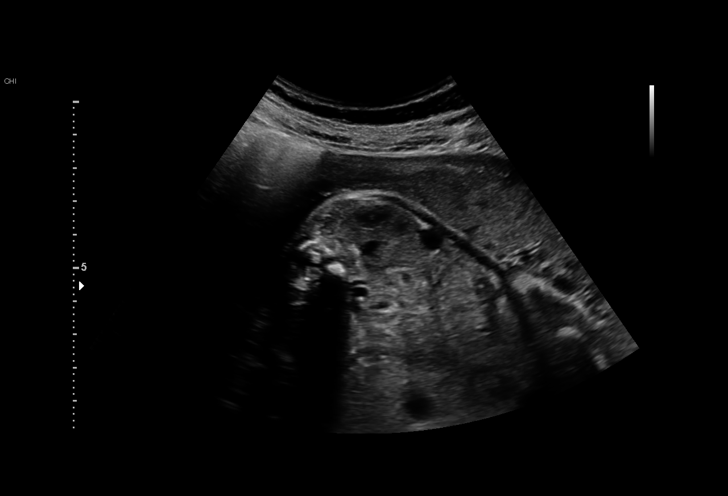
[im 40/52]
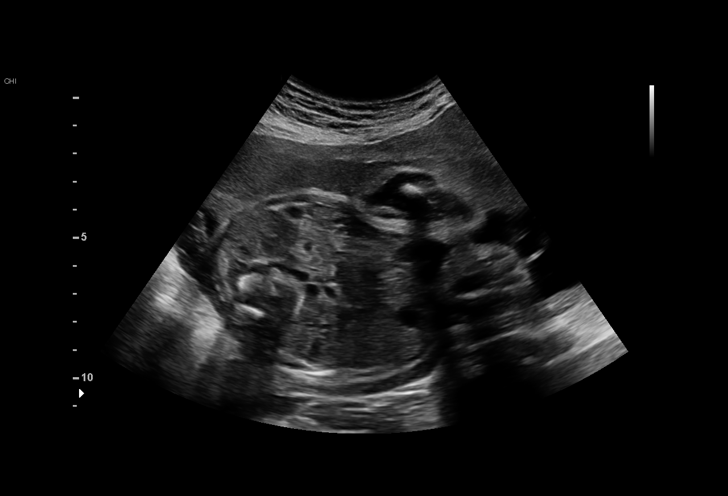
[im 44/52]
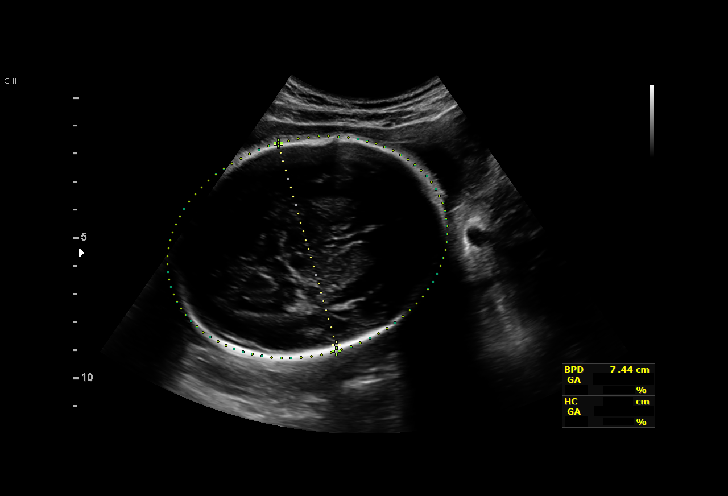
[im 48/52]
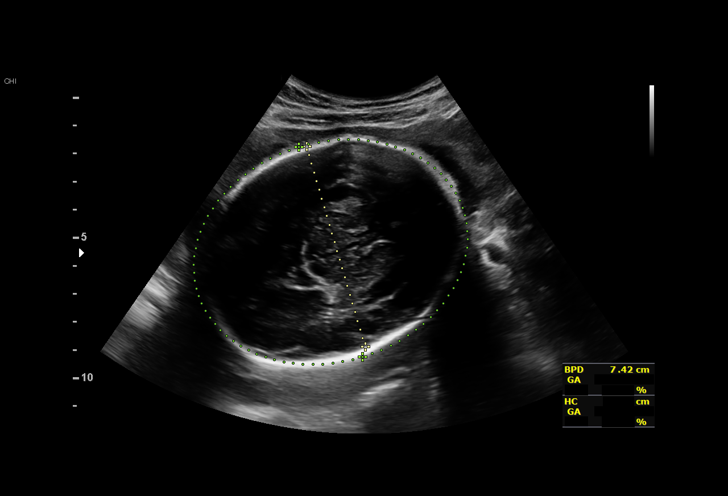
[im 52/52]
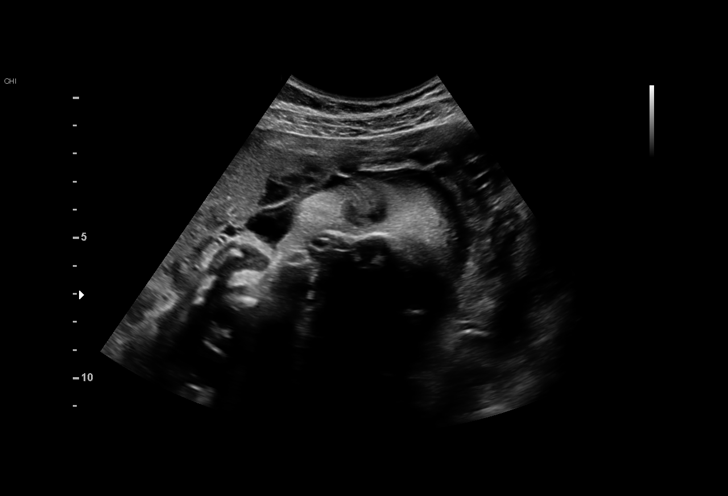

[14 of 28 positions shown; findings below may reference images not displayed]

pm)

([HOSPITAL])

Indications

29 weeks gestation of pregnancy
Gestational diabetes in pregnancy, diet
controlled
OB History

Blood Type:            Height:  5'7"   Weight (lb):  165       BMI:
Gravidity:    3         Term:   1        Prem:   0        SAB:   1
TOP:          0       Ectopic:  0        Living: 1
Fetal Evaluation

Num Of Fetuses:     1
Fetal Heart         151
Rate(bpm):
Cardiac Activity:   Observed
Presentation:       Cephalic
Placenta:           Anterior, above cervical os
P. Cord Insertion:  Previously Visualized
Amniotic Fluid
AFI FV:      Subjectively within normal limits

AFI Sum(cm)     %Tile       Largest Pocket(cm)
11.13           22

RUQ(cm)       RLQ(cm)       LUQ(cm)        LLQ(cm)
2.1
Biometry

BPD:      74.7  mm     G. Age:  30w 0d         50  %    CI:        72.52   %    70 - 86
FL/HC:      21.5   %    19.2 -
HC:       279   mm     G. Age:  30w 4d         44  %    HC/AC:      1.06        0.99 -
AC:      262.5  mm     G. Age:  30w 2d         69  %    FL/BPD:     80.2   %    71 - 87
FL:       59.9  mm     G. Age:  31w 1d         79  %    FL/AC:      22.8   %    20 - 24
Est. FW:    7356  gm      3 lb 9 oz     72  %
Gestational Age

LMP:           29w 4d        Date:  11/04/15                 EDD:   08/10/16
U/S Today:     30w 4d                                        EDD:   08/03/16
Best:          29w 4d     Det. By:  LMP  (11/04/15)          EDD:   08/10/16
Anatomy

Cranium:               Appears normal         Aortic Arch:            Previously seen
Cavum:                 Appears normal         Ductal Arch:            Previously seen
Ventricles:            Previously seen        Diaphragm:              Previously seen
Choroid Plexus:        Appears normal         Stomach:                Appears normal, left
sided
Cerebellum:            Previously seen        Abdomen:                Appears normal
Posterior Fossa:       Previously seen        Abdominal Wall:         Previously seen
Nuchal Fold:           Not applicable (>20    Cord Vessels:           Previously seen
wks GA)
Face:                  Orbits previously      Kidneys:                Appear normal
seen
Lips:                  Appears normal         Bladder:                Appears normal
Thoracic:              Appears normal         Spine:                  Previously seen
Heart:                 Appears normal         Upper Extremities:      Previously seen
(4CH, axis, and situs
RVOT:                  Previously seen        Lower Extremities:      Previously seen
LVOT:                  Previously seen

Other:  Fetus previously appears to be a female. Heels previously visualized.
Nasal bone previously visualized.  Technically difficult due to fetal
position.
Cervix Uterus Adnexa

Cervix
Not visualized (advanced GA >92wks)
Impression

Single living intrauterine pregnancy at 29 weeks 4 days.
Appropriate interval fetal growth (72%).
Normal amniotic fluid volume.
Normal interval fetal anatomy.
Recommendations

If Ms. Engelbert X ultimately needs glyburide or insulin to
achieve adequate glycemic control she will need serial
growth scans every 4 weeks and antenatal testing starting at
32 weeks.

## 2018-08-02 IMAGING — US US MFM FETAL BPP W/O NON-STRESS
1 series · 14 of 28 positions shown · non-contrast
Comparison: none

[Series 1: us mfm fetal bpp w/o non-stress · 32 acquisitions, 14 frames shown]
[im 2/32]
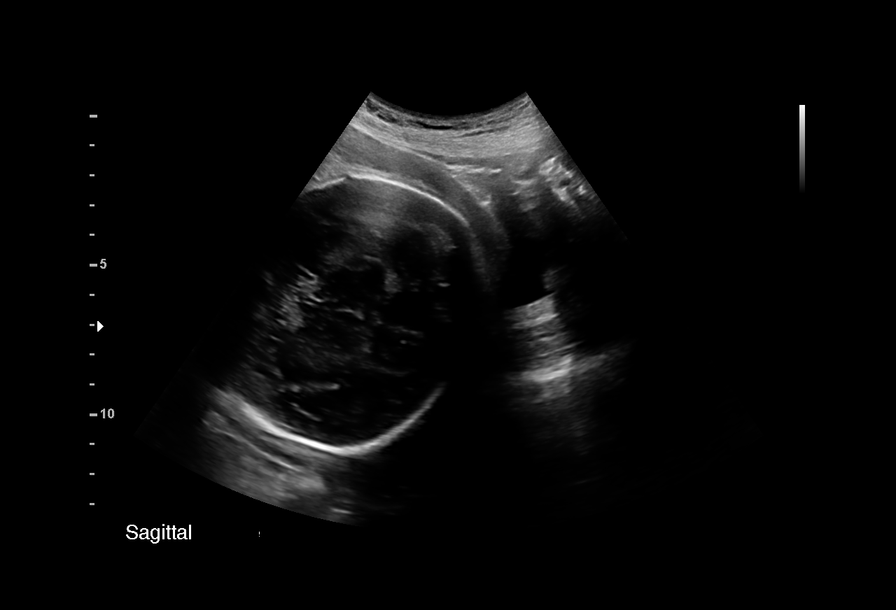
[im 4/32]
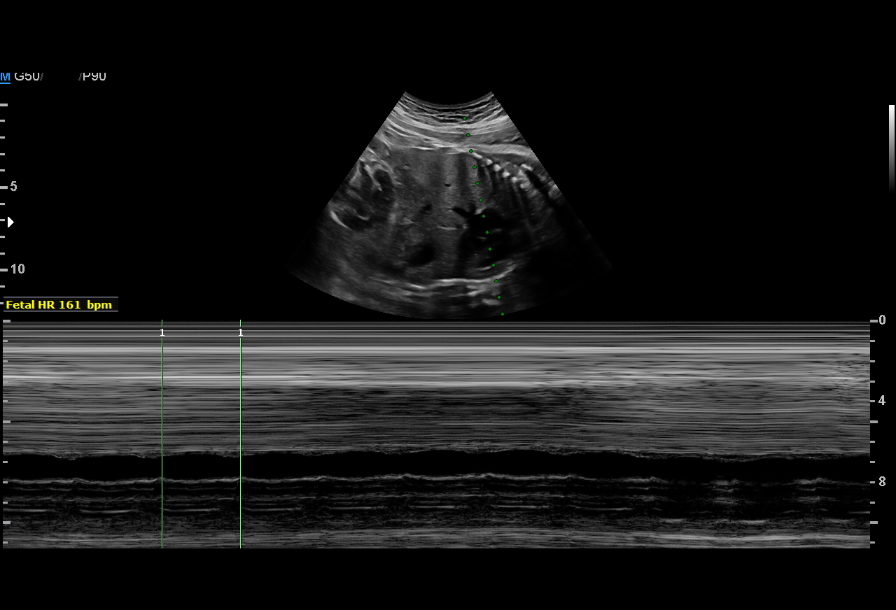
[im 6/32]
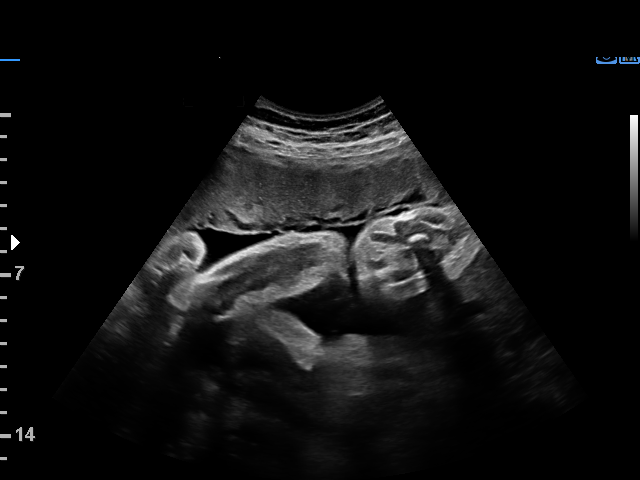
[im 9/32]
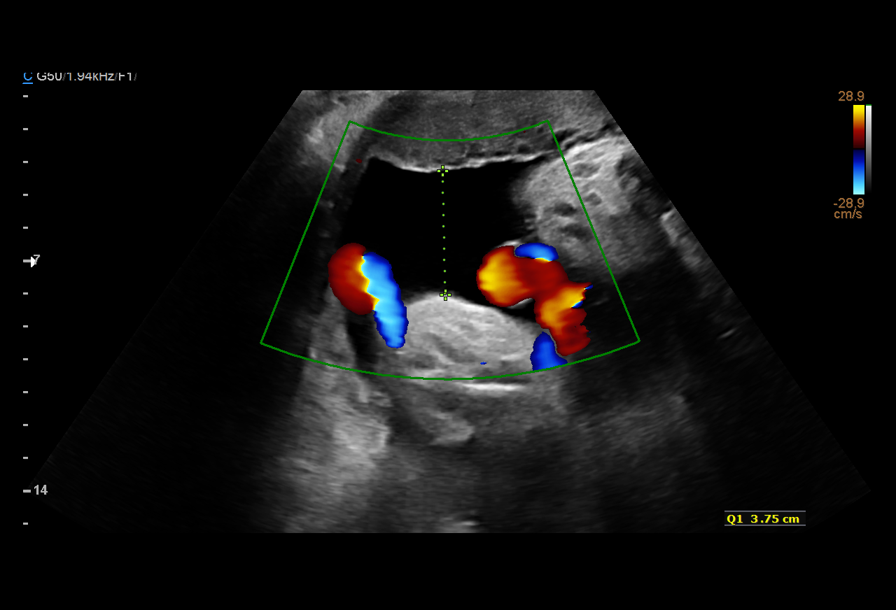
[im 11/32]
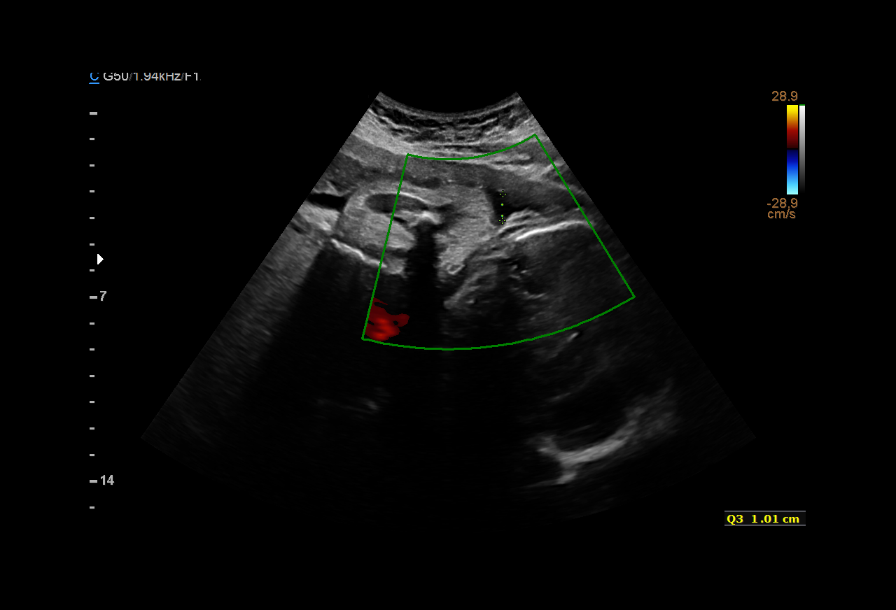
[im 13/32]
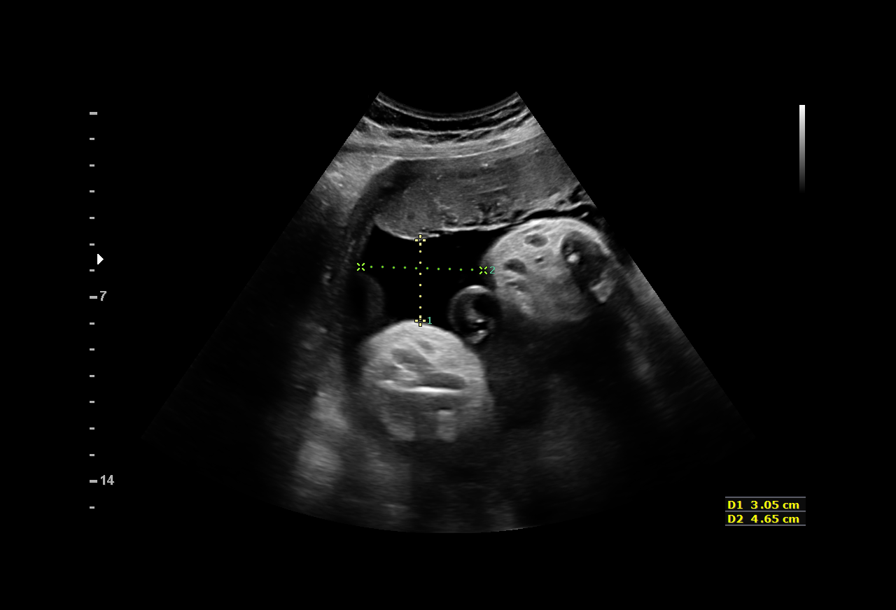
[im 15/32]
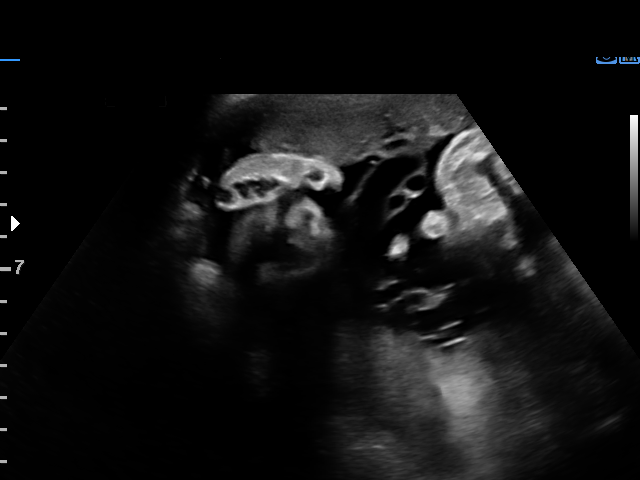
[im 18/32]
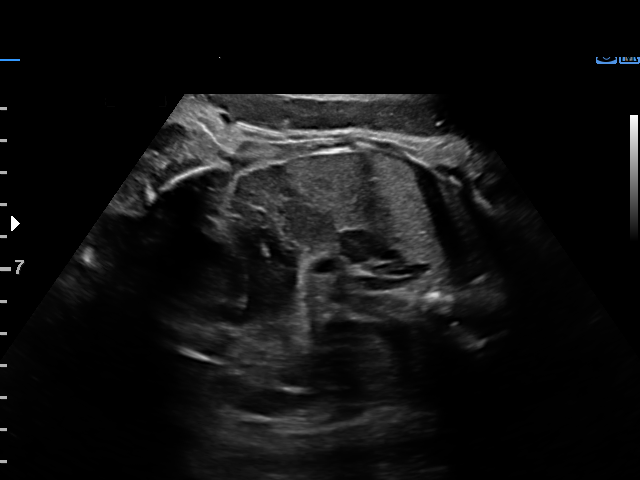
[im 20/32]
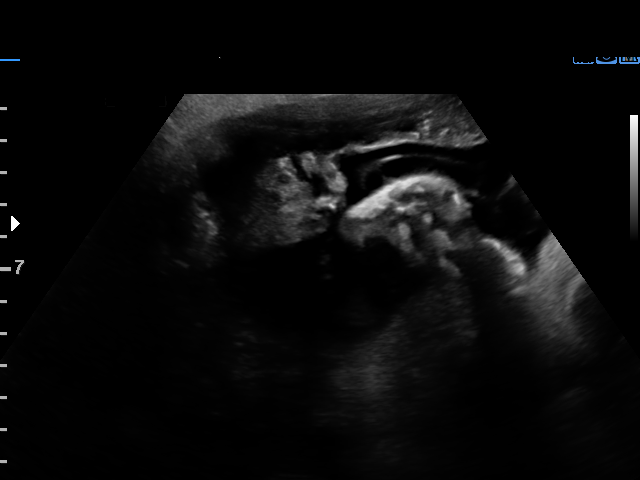
[im 22/32]
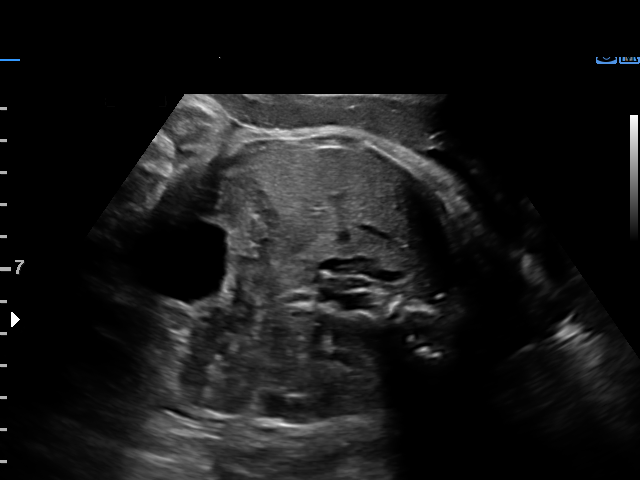
[im 25/32]
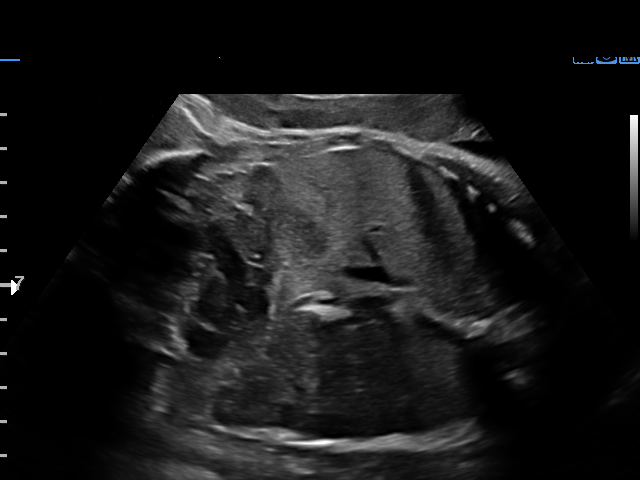
[im 27/32]
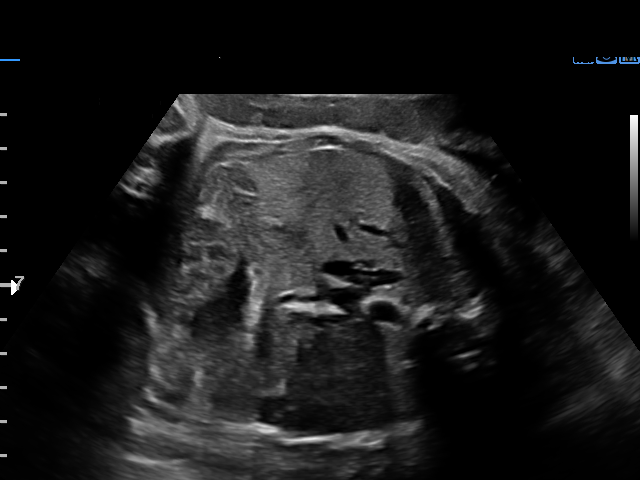
[im 29/32]
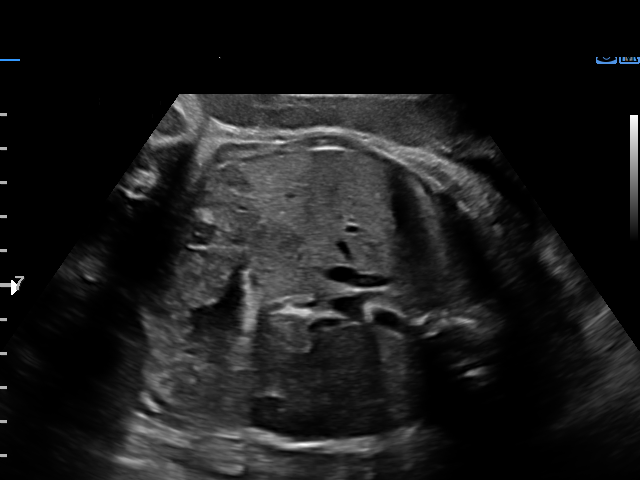
[im 32/32]
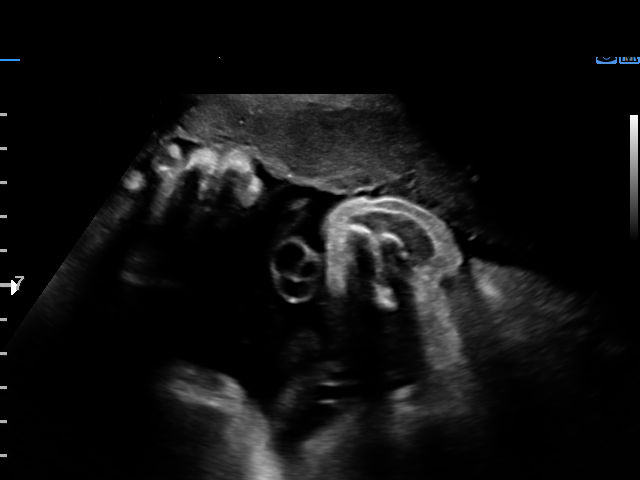

[14 of 28 positions shown; findings below may reference images not displayed]

([HOSPITAL])

1  HERLINDA BILLINGS           [id number]      [id number]     [id number]
Indications

35 weeks gestation of pregnancy
Gestational diabetes in pregnancy,
controlled by oral hypoglycemic drugs
OB History

Blood Type:            Height:  5'7"   Weight (lb):  165      BMI:
Gravidity:    3         Term:   1        Prem:   0        SAB:   1
TOP:          0       Ectopic:  0        Living: 1
Fetal Evaluation

Num Of Fetuses:     1
Cardiac Activity:   Observed
Presentation:       Cephalic
Biophysical Evaluation

Amniotic F.V:   Within normal limits       F. Tone:        Observed
F. Movement:    Observed                   Score:          [DATE]
F. Breathing:   Observed
Gestational Age

LMP:           35w 4d       Date:   11/04/15                 EDD:   08/10/16
Best:          35w 4d    Det. By:   LMP  (11/04/15)          EDD:   08/10/16
Impression

Single IUP at 35w 4d
Gestational diabetes on oral medications
BPP [DATE]
Normal amniotic fluid volume
Recommendations

Continue antenatal testing as scheduled
Delivery by 39 weeks gestation

## 2018-09-14 ENCOUNTER — Emergency Department (HOSPITAL_COMMUNITY): Payer: Medicaid Other

## 2018-09-14 ENCOUNTER — Emergency Department (HOSPITAL_COMMUNITY)
Admission: EM | Admit: 2018-09-14 | Discharge: 2018-09-15 | Disposition: A | Discharge status: Home / Self Care | Payer: Medicaid Other | Attending: Emergency Medicine | Admitting: Emergency Medicine

## 2018-09-14 ENCOUNTER — Encounter (HOSPITAL_COMMUNITY): Payer: Self-pay | Admitting: Emergency Medicine

## 2018-09-14 ENCOUNTER — Other Ambulatory Visit: Payer: Self-pay

## 2018-09-14 DIAGNOSIS — R8271 Bacteriuria: Secondary | ICD-10-CM | POA: Diagnosis not present

## 2018-09-14 DIAGNOSIS — O9989 Other specified diseases and conditions complicating pregnancy, childbirth and the puerperium: Secondary | ICD-10-CM | POA: Insufficient documentation

## 2018-09-14 DIAGNOSIS — Z87891 Personal history of nicotine dependence: Secondary | ICD-10-CM | POA: Diagnosis not present

## 2018-09-14 DIAGNOSIS — Z3A01 Less than 8 weeks gestation of pregnancy: Secondary | ICD-10-CM | POA: Insufficient documentation

## 2018-09-14 DIAGNOSIS — O99891 Other specified diseases and conditions complicating pregnancy: Secondary | ICD-10-CM

## 2018-09-14 DIAGNOSIS — O24419 Gestational diabetes mellitus in pregnancy, unspecified control: Secondary | ICD-10-CM | POA: Diagnosis present

## 2018-09-14 LAB — BASIC METABOLIC PANEL
Anion gap: 11 (ref 5–15)
BUN: 11 mg/dL (ref 6–20)
CO2: 26 mmol/L (ref 22–32)
Calcium: 9.5 mg/dL (ref 8.9–10.3)
Chloride: 95 mmol/L — ABNORMAL LOW (ref 98–111)
Creatinine, Ser: 0.78 mg/dL (ref 0.44–1.00)
GFR calc Af Amer: >60 mL/min (ref >60)
GFR calc non Af Amer: >60 mL/min (ref >60)
Glucose, Bld: 266 mg/dL — ABNORMAL HIGH (ref 70–99)
Potassium: 3.6 mmol/L (ref 3.5–5.1)
Sodium: 132 mmol/L — ABNORMAL LOW (ref 135–145)

## 2018-09-14 LAB — URINALYSIS, ROUTINE W REFLEX MICROSCOPIC
Bilirubin Urine: NEGATIVE
Glucose, UA: >=500 mg/dL — AB
Hgb urine dipstick: NEGATIVE
Ketones, ur: NEGATIVE mg/dL
Nitrite: NEGATIVE
Protein, ur: NEGATIVE mg/dL
Specific Gravity, Urine: 1.003 — ABNORMAL LOW (ref 1.005–1.030)
pH: 6 (ref 5.0–8.0)

## 2018-09-14 LAB — CBC
HCT: 41 % (ref 36.0–46.0)
Hemoglobin: 13.2 g/dL (ref 12.0–15.0)
MCH: 26.8 pg (ref 26.0–34.0)
MCHC: 32.2 g/dL (ref 30.0–36.0)
MCV: 83.3 fL (ref 80.0–100.0)
Platelets: 225 10*3/uL (ref 150–400)
RBC: 4.92 MIL/uL (ref 3.87–5.11)
RDW: 13.6 % (ref 11.5–15.5)
WBC: 10.1 10*3/uL (ref 4.0–10.5)
nRBC: 0 % (ref 0.0–0.2)

## 2018-09-14 LAB — I-STAT BETA HCG BLOOD, ED (MC, WL, AP ONLY): I-stat hCG, quantitative: >2000 m[IU]/mL — ABNORMAL HIGH (ref <5)

## 2018-09-14 LAB — CBG MONITORING, ED: Glucose-Capillary: 272 mg/dL — ABNORMAL HIGH (ref 70–99)

## 2018-09-14 MED ORDER — SODIUM CHLORIDE 0.9 % IV BOLUS
1000.0000 mL | Freq: Once | INTRAVENOUS | Status: AC
  Administered 2018-09-14: 1000 mL via INTRAVENOUS

## 2018-09-14 NOTE — ED Triage Notes (Signed)
Pt reports having blood sugar of 353 and has family history of diabetes. CBG at time of triage 272. Pt did reports occasional lightheadedness.

## 2018-09-14 NOTE — ED Notes (Signed)
Bed: WA05 Expected date:  Expected time:  Means of arrival:  Comments: 

## 2018-09-15 MED ORDER — NITROFURANTOIN MONOHYD MACRO 100 MG PO CAPS
100.0000 mg | ORAL_CAPSULE | Freq: Once | ORAL | Status: AC
  Administered 2018-09-15: 100 mg via ORAL
  Filled 2018-09-15: qty 1

## 2018-09-15 MED ORDER — METFORMIN HCL 1000 MG PO TABS: 1000.0000 mg | ORAL_TABLET | Freq: Two times a day (BID) | ORAL | 0 refills | Status: DC

## 2018-09-15 MED ORDER — METFORMIN HCL 500 MG PO TABS
500.0000 mg | ORAL_TABLET | Freq: Once | ORAL | Status: AC
  Administered 2018-09-15: 500 mg via ORAL
  Filled 2018-09-15: qty 1

## 2018-09-15 MED ORDER — NITROFURANTOIN MONOHYD MACRO 100 MG PO CAPS: 100.0000 mg | ORAL_CAPSULE | Freq: Two times a day (BID) | ORAL | 0 refills | Status: DC

## 2018-09-15 NOTE — ED Provider Notes (Signed)
[facility]-EMERGENCY DEPT Provider Note   CSN: [id number] Arrival date & time: 09/14/18  2023     History   Chief Complaint Chief Complaint  Patient presents with   Hyperglycemia    HPI Stephannie Lieisha S Flavell is a 28 y.o. female.     HPI  28 year old female comes in a chief complaint of elevated blood sugar.  Patient reports that over the past 2 or 3 weeks she has been having episodes of frequent urination, feeling thirsty all the time.  She had similar symptoms when she had gestational diabetes with her prior pregnancies.  Today she had some headaches and feeling weak so she called her mother, who checked her blood sugar and they found out that it was in the 300s.  Patient is now G3, P2.  She had a positive pregnancy test in the ED, but she reports that she had taken Plan B after having intercourse with her boyfriend.  She states that the last period was in May, the period in June was more like a spotting which is not normal for her.  She has no abdominal pain, back pain, UTI-like symptoms, nausea, vomiting.   Past Medical History:  Diagnosis Date   Gestational diabetes    metformin   Medical history non-contributory    Short cervix     Patient Active Problem List   Diagnosis Date Noted   Gestational diabetes 08/03/2016   Medication controlled gestational diabetes mellitus in third trimester 05/16/2016   Short cervix affecting pregnancy 04/04/2016   Low vitamin D level 01/14/2016   Supervision of high risk pregnancy, antepartum 01/09/2016   Prior pregnancy complicated by short cervix, antepartum 01/09/2016   ASCUS pap smear but negative high-risk HPV on 10/16/2015.    10/23/2015    Past Surgical History:  Procedure Laterality Date   INDUCED ABORTION       OB History    Gravida  3   Para  2   Term  2   Preterm  0   AB  1   Living  2     SAB  0   TAB  1   Ectopic  0   Multiple  0   Live Births  2             Home Medications    Prior to Admission medications   Medication Sig Start Date End Date Taking? Authorizing Provider  docusate sodium (COLACE) 100 MG capsule Take 1 capsule (100 mg total) by mouth 2 (two) times daily as needed. Patient not taking: Reported on 09/14/2018 06/26/16   Anyanwu, Jethro BastosUgonna A, MD  ibuprofen (ADVIL,MOTRIN) 600 MG tablet Take 1 tablet (600 mg total) by mouth every 6 (six) hours. Patient not taking: Reported on 09/14/2018 08/05/16   Allie Bossierove, Myra C, MD  norethindrone (MICRONOR,CAMILA,ERRIN) 0.35 MG tablet Take 1 tablet (0.35 mg total) by mouth daily. Patient not taking: Reported on 09/14/2018 08/05/16   Allie Bossierove, Myra C, MD  Vitamin D, Ergocalciferol, (DRISDOL) 50000 units CAPS capsule Take 1 capsule (50,000 Units total) by mouth every 7 (seven) days. Patient not taking: Reported on 09/14/2018 01/14/16   Roe Coombsenney, Rachelle A, CNM    Family History Family History  Problem Relation Age of Onset   Diabetes Mother    Cancer Maternal Grandmother    Diabetes Maternal Grandmother    Cancer Maternal Grandfather     Social History Social History   Tobacco Use   Smoking status: Former Smoker    Types:  Cigars   Smokeless tobacco: Never Used  Substance Use Topics   Alcohol use: No    Comment: Occasionally   Drug use: No     Allergies   Patient has no known allergies.   Review of Systems Review of Systems  Constitutional: Positive for activity change.  Gastrointestinal: Negative for nausea and vomiting.  Genitourinary: Positive for frequency. Negative for dysuria.  Neurological: Positive for headaches.  All other systems reviewed and are negative.    Physical Exam Updated Vital Signs BP 111/73 (BP Location: Right Arm)    Pulse 80    Temp 98.9 F (37.2 C) (Oral)    Resp 18    Ht 5\' 7"  (1.702 m)    Wt 79.8 kg    LMP 07/28/2018    SpO2 100%    BMI 27.57 kg/m   Physical Exam Vitals signs and nursing note reviewed.  Constitutional:      Appearance: She is  well-developed.  HENT:     Head: Normocephalic and atraumatic.  Eyes:     Pupils: Pupils are equal, round, and reactive to light.  Neck:     Musculoskeletal: Neck supple.  Cardiovascular:     Rate and Rhythm: Normal rate and regular rhythm.     Heart sounds: Normal heart sounds. No murmur.  Pulmonary:     Effort: Pulmonary effort is normal. No respiratory distress.  Abdominal:     General: There is no distension.     Palpations: Abdomen is soft.     Tenderness: There is no abdominal tenderness. There is no guarding or rebound.  Skin:    General: Skin is warm and dry.  Neurological:     Mental Status: She is alert and oriented to person, place, and time.      ED Treatments / Results  Labs (all labs ordered are listed, but only abnormal results are displayed) Labs Reviewed  BASIC METABOLIC PANEL - Abnormal; Notable for the following components:      Result Value   Sodium 132 (*)    Chloride 95 (*)    Glucose, Bld 266 (*)    All other components within normal limits  URINALYSIS, ROUTINE W REFLEX MICROSCOPIC - Abnormal; Notable for the following components:   Color, Urine STRAW (*)    Specific Gravity, Urine 1.003 (*)    Glucose, UA >=500 (*)    Leukocytes,Ua TRACE (*)    Bacteria, UA MANY (*)    All other components within normal limits  CBG MONITORING, ED - Abnormal; Notable for the following components:   Glucose-Capillary 272 (*)    All other components within normal limits  I-STAT BETA HCG BLOOD, ED ([facility], [facility], [facility] ONLY) - Abnormal; Notable for the following components:   I-stat hCG, quantitative >2,000.0 (*)    All other components within normal limits  CBC  CBG MONITORING, ED    EKG None  Radiology [place] Ob Comp Less 14 Wks  Result Date: 09/15/2018 CLINICAL DATA:  Pelvic pain EXAM: OBSTETRIC <14 WK US AND TRANSVAGINAL OB US TECHNIQUE: Both transabdominal and transvaginal ultrasound examinations were performed for complete evaluation of the gestation as well as the  maternal uterus, adnexal regions, and pelvic cul-de-sac. Transvaginal technique was performed to assess early pregnancy. COMPARISON:  None. FINDINGS: Intrauterine gestational sac: Single Yolk sac:  Visualized Embryo:  Visualized Cardiac Activity: Visualized Heart Rate: 132 bpm MSD:   mm    w     d CRL:  8.5 mm   6 w  6 d                  US EDC: 05/04/2019 Subchorionic hemorrhage:  Small subchorionic hemorrhage Maternal uterus/adnexae: No adnexal mass or free fluid. IMPRESSION: Six week 6 day intrauterine pregnancy. Fetal heart rate 132 beats per minute. Small subchorionic hemorrhage. Electronically Signed   By: Charlett NoseKevin  Dover M.D.   On: 09/15/2018 00:20   [place] Ob Transvaginal  Result Date: 09/15/2018 CLINICAL DATA:  Pelvic pain EXAM: OBSTETRIC <14 WK US AND TRANSVAGINAL OB US TECHNIQUE: Both transabdominal and transvaginal ultrasound examinations were performed for complete evaluation of the gestation as well as the maternal uterus, adnexal regions, and pelvic cul-de-sac. Transvaginal technique was performed to assess early pregnancy. COMPARISON:  None. FINDINGS: Intrauterine gestational sac: Single Yolk sac:  Visualized Embryo:  Visualized Cardiac Activity: Visualized Heart Rate: 132 bpm MSD:   mm    w     d CRL:  8.5 mm   6 w   6 d                  US EDC: 05/04/2019 Subchorionic hemorrhage:  Small subchorionic hemorrhage Maternal uterus/adnexae: No adnexal mass or free fluid. IMPRESSION: Six week 6 day intrauterine pregnancy. Fetal heart rate 132 beats per minute. Small subchorionic hemorrhage. Electronically Signed   By: Charlett NoseKevin  Dover M.D.   On: 09/15/2018 00:20    Procedures Procedures (including critical care time)  Medications Ordered in ED Medications  nitrofurantoin (macrocrystal-monohydrate) (MACROBID) capsule 100 mg (has no administration in time range)  metFORMIN (GLUCOPHAGE) tablet 500 mg (has no administration in time range)  sodium chloride 0.9 % bolus 1,000 mL (1,000 mLs Intravenous New  Bag/Given (Non-Interop) 09/14/18 2343)     Initial Impression / Assessment and Plan / ED Course  I have reviewed the triage vital signs and the nursing notes.  Pertinent labs & imaging results that were available during my care of the patient were reviewed by me and considered in my medical decision making (see chart for details).        28 year old comes in a chief complaint of elevated blood sugar.  She was having some vague symptoms, similar to her gestational diabetes and therefore she decided to come to the ER for further diagnosis and treatment.  Her blood sugar is indeed slightly elevated, but she is also pregnant.  Patient was unaware that she was pregnant.  She reports her LMP was in June, but she had vague spotting at that time, which is not typical for menstrual period.  UA shows asymptomatic bacteriuria.  We will start her on Macrobid. Ultrasound shows that she is 6 weeks and 6 days pregnant.  Discussed the case with unassigned OB and they recommend that we start patient on metformin twice daily and since patient is possibly considering abortion they would want her to be seen as soon as possible.  The patient appears reasonably screened and/or stabilized for discharge and I doubt any other medical condition or other [facility] requiring further screening, evaluation, or treatment in the ED at this time prior to discharge.   Results from the ER workup discussed with the patient face to face and all questions answered to the best of my ability. The patient is safe for discharge with strict return precautions.   Final Clinical Impressions(s) / ED Diagnoses   Final diagnoses:  Gestational diabetes mellitus (GDM) in first trimester, gestational diabetes method of control unspecified  Asymptomatic bacteriuria during pregnancy    ED Discharge Orders  None       Varney Biles, MD 09/15/18 [phone]

## 2018-09-15 NOTE — Discharge Instructions (Signed)
Please take the medications prescribed.  Expect a call from women's clinic tomorrow.  If they do not call you by noon then call the number provided for prompt follow-up.

## 2018-09-15 NOTE — ED Notes (Signed)
Provided pt with graham crackers and peanut butter at Dr.Nanavati's approval.

## 2018-10-17 ENCOUNTER — Encounter (HOSPITAL_COMMUNITY): Payer: Self-pay | Admitting: Emergency Medicine

## 2018-10-17 ENCOUNTER — Emergency Department (HOSPITAL_COMMUNITY)
Admission: EM | Admit: 2018-10-17 | Discharge: 2018-10-18 | Disposition: A | Discharge status: Home / Self Care | Payer: Medicaid Other | Attending: Emergency Medicine | Admitting: Emergency Medicine

## 2018-10-17 ENCOUNTER — Other Ambulatory Visit: Payer: Self-pay

## 2018-10-17 DIAGNOSIS — O039 Complete or unspecified spontaneous abortion without complication: Secondary | ICD-10-CM | POA: Insufficient documentation

## 2018-10-17 DIAGNOSIS — N760 Acute vaginitis: Secondary | ICD-10-CM | POA: Diagnosis not present

## 2018-10-17 DIAGNOSIS — R739 Hyperglycemia, unspecified: Secondary | ICD-10-CM | POA: Diagnosis not present

## 2018-10-17 DIAGNOSIS — B9689 Other specified bacterial agents as the cause of diseases classified elsewhere: Secondary | ICD-10-CM | POA: Diagnosis not present

## 2018-10-17 DIAGNOSIS — Z3A00 Weeks of gestation of pregnancy not specified: Secondary | ICD-10-CM | POA: Diagnosis not present

## 2018-10-17 DIAGNOSIS — O468X9 Other antepartum hemorrhage, unspecified trimester: Secondary | ICD-10-CM | POA: Diagnosis present

## 2018-10-17 DIAGNOSIS — Z8632 Personal history of gestational diabetes: Secondary | ICD-10-CM | POA: Diagnosis not present

## 2018-10-17 LAB — CBC
HCT: 39.8 % (ref 36.0–46.0)
Hemoglobin: 12.4 g/dL (ref 12.0–15.0)
MCH: 26.1 pg (ref 26.0–34.0)
MCHC: 31.2 g/dL (ref 30.0–36.0)
MCV: 83.8 fL (ref 80.0–100.0)
Platelets: 240 10*3/uL (ref 150–400)
RBC: 4.75 MIL/uL (ref 3.87–5.11)
RDW: 13.3 % (ref 11.5–15.5)
WBC: 9.7 10*3/uL (ref 4.0–10.5)
nRBC: 0 % (ref 0.0–0.2)

## 2018-10-17 LAB — BASIC METABOLIC PANEL
Anion gap: 10 (ref 5–15)
BUN: 16 mg/dL (ref 6–20)
CO2: 26 mmol/L (ref 22–32)
Calcium: 9.3 mg/dL (ref 8.9–10.3)
Chloride: 99 mmol/L (ref 98–111)
Creatinine, Ser: 0.65 mg/dL (ref 0.44–1.00)
GFR calc Af Amer: >60 mL/min (ref >60)
GFR calc non Af Amer: >60 mL/min (ref >60)
Glucose, Bld: 365 mg/dL — ABNORMAL HIGH (ref 70–99)
Potassium: 3.6 mmol/L (ref 3.5–5.1)
Sodium: 135 mmol/L (ref 135–145)

## 2018-10-17 LAB — URINALYSIS, ROUTINE W REFLEX MICROSCOPIC
Bacteria, UA: NONE SEEN
Bilirubin Urine: NEGATIVE
Glucose, UA: >=500 mg/dL — AB
Ketones, ur: NEGATIVE mg/dL
Nitrite: NEGATIVE
Protein, ur: NEGATIVE mg/dL
Specific Gravity, Urine: 1.033 — ABNORMAL HIGH (ref 1.005–1.030)
pH: 6 (ref 5.0–8.0)

## 2018-10-17 LAB — I-STAT BETA HCG BLOOD, ED (MC, WL, AP ONLY): I-stat hCG, quantitative: 82.3 m[IU]/mL — ABNORMAL HIGH (ref <5)

## 2018-10-17 LAB — CBG MONITORING, ED
Glucose-Capillary: 284 mg/dL — ABNORMAL HIGH (ref 70–99)
Glucose-Capillary: 331 mg/dL — ABNORMAL HIGH (ref 70–99)

## 2018-10-17 MED ORDER — METFORMIN HCL 500 MG PO TABS: 500.0000 mg | ORAL_TABLET | Freq: Two times a day (BID) | ORAL | Status: DC

## 2018-10-17 MED ORDER — SODIUM CHLORIDE 0.9 % IV BOLUS
1000.0000 mL | Freq: Once | INTRAVENOUS | Status: AC
  Administered 2018-10-17: 1000 mL via INTRAVENOUS

## 2018-10-17 MED ORDER — METFORMIN HCL 500 MG PO TABS
1000.0000 mg | ORAL_TABLET | Freq: Two times a day (BID) | ORAL | Status: DC
  Administered 2018-10-18: 1000 mg via ORAL
  Filled 2018-10-17: qty 2

## 2018-10-17 NOTE — ED Notes (Signed)
Pt given ice water.

## 2018-10-17 NOTE — ED Notes (Signed)
Pt ambulated to BR and was asked to provide a urine specimen.

## 2018-10-17 NOTE — ED Notes (Addendum)
Claiborne Billings, [place] and [place], RN aware of pt's CBG results of 284.

## 2018-10-17 NOTE — ED Notes (Signed)
Pt reminded she needed to provide a urine specimen.

## 2018-10-17 NOTE — ED Provider Notes (Signed)
[facility]-EMERGENCY DEPT Provider Note   CSN: [id number] Arrival date & time: 10/17/18  1949    History   Chief Complaint Chief Complaint  Patient presents with  . Hyperglycemia    HPI Kelsey Jones is a 28 y.o. female.     28 y/o female presents to the ED for c/o hyperglycemia.  Patient was diagnosed with gestational diabetes 1 month ago.  She was started on metformin, but only took this medication for 1 day as she believes she may have thrown it out accidentally.  Was seen in urgent care 2 weeks later and told she was having a miscarriage.  She has continued to experience vaginal bleeding, but denies any worsening to this bleeding or abdominal cramping.  Has intermittently felt lightheaded with sporadic migraines.  Also complains of fatigue, polyuria, and polydipsia.  She has not had any fevers, vomiting.  Has tried drinking plenty of water, but this has not provided any improvement to her symptoms.  The history is provided by the patient. No language interpreter was used.  Hyperglycemia   Past Medical History:  Diagnosis Date  . Gestational diabetes    metformin  . Medical history non-contributory   . Short cervix     Patient Active Problem List   Diagnosis Date Noted  . Gestational diabetes 08/03/2016  . Medication controlled gestational diabetes mellitus in third trimester 05/16/2016  . Short cervix affecting pregnancy 04/04/2016  . Low vitamin D level 01/14/2016  . Supervision of high risk pregnancy, antepartum 01/09/2016  . Prior pregnancy complicated by short cervix, antepartum 01/09/2016  . ASCUS pap smear but negative high-risk HPV on 10/16/2015.    10/23/2015    Past Surgical History:  Procedure Laterality Date  . INDUCED ABORTION       OB History    Gravida  3   Para  2   Term  2   Preterm  0   AB  1   Living  2     SAB  0   TAB  1   Ectopic  0   Multiple  0   Live Births  2            Home  Medications    Prior to Admission medications   Medication Sig Start Date End Date Taking? Authorizing Provider  metFORMIN (GLUCOPHAGE) 1000 MG tablet Take 1 tablet (1,000 mg total) by mouth 2 (two) times daily. 10/18/18   Antony MaduraHumes, Gearldean Lomanto, PA-C  metroNIDAZOLE (FLAGYL) 500 MG tablet Take 1 tablet (500 mg total) by mouth 2 (two) times daily. 10/18/18   Antony MaduraHumes, Idamay Hosein, PA-C    Family History Family History  Problem Relation Age of Onset  . Diabetes Mother   . Cancer Maternal Grandmother   . Diabetes Maternal Grandmother   . Cancer Maternal Grandfather     Social History Social History   Tobacco Use  . Smoking status: Former Smoker    Types: Cigars  . Smokeless tobacco: Never Used  Substance Use Topics  . Alcohol use: No    Comment: Occasionally  . Drug use: No     Allergies   Patient has no known allergies.   Review of Systems Review of Systems Ten systems reviewed and are negative for acute change, except as noted in the HPI.    Physical Exam Updated Vital Signs BP 115/77 (BP Location: Left Arm)   Pulse 83   Temp 99 F (37.2 C) (Oral)   Resp 16   SpO2  99%   Physical Exam Vitals signs and nursing note reviewed.  Constitutional:      General: She is not in acute distress.    Appearance: She is well-developed. She is not diaphoretic.     Comments: Nontoxic appearing and in NAD  HENT:     Head: Normocephalic and atraumatic.  Eyes:     General: No scleral icterus.    Conjunctiva/sclera: Conjunctivae normal.  Neck:     Musculoskeletal: Normal range of motion.  Cardiovascular:     Rate and Rhythm: Normal rate and regular rhythm.     Pulses: Normal pulses.  Pulmonary:     Effort: Pulmonary effort is normal. No respiratory distress.     Comments: Respirations even and unlabored Abdominal:     General: There is no distension.     Palpations: Abdomen is soft.  Genitourinary:    Comments: Scant bloody discharge in vaginal vault and at introitus.  Normal external  genitalia. Musculoskeletal: Normal range of motion.  Skin:    General: Skin is warm and dry.     Coloration: Skin is not pale.     Findings: No erythema or rash.  Neurological:     Mental Status: She is alert and oriented to person, place, and time.     Coordination: Coordination normal.     Comments: Moving all extremities spontaneously.  Psychiatric:        Behavior: Behavior normal.      ED Treatments / Results  Labs (all labs ordered are listed, but only abnormal results are displayed) Labs Reviewed  WET PREP, GENITAL - Abnormal; Notable for the following components:      Result Value   Clue Cells Wet Prep HPF POC PRESENT (*)    WBC, Wet Prep HPF POC MANY (*)    All other components within normal limits  BASIC METABOLIC PANEL - Abnormal; Notable for the following components:   Glucose, Bld 365 (*)    All other components within normal limits  URINALYSIS, ROUTINE W REFLEX MICROSCOPIC - Abnormal; Notable for the following components:   Specific Gravity, Urine 1.033 (*)    Glucose, UA >=500 (*)    Hgb urine dipstick LARGE (*)    Leukocytes,Ua MODERATE (*)    All other components within normal limits  CBG MONITORING, ED - Abnormal; Notable for the following components:   Glucose-Capillary 331 (*)    All other components within normal limits  CBG MONITORING, ED - Abnormal; Notable for the following components:   Glucose-Capillary 284 (*)    All other components within normal limits  I-STAT BETA HCG BLOOD, ED ([facility], [facility], [facility] ONLY) - Abnormal; Notable for the following components:   I-stat hCG, quantitative 82.3 (*)    All other components within normal limits  CBC  CBG MONITORING, ED    EKG None  Radiology No results found.  Procedures Procedures (including critical care time)  Medications Ordered in ED Medications  metFORMIN (GLUCOPHAGE) tablet 1,000 mg (1,000 mg Oral Given 10/18/18 0024)  sodium chloride 0.9 % bolus 1,000 mL (0 mLs Intravenous Stopped 10/18/18 0025)   ibuprofen (ADVIL) tablet 800 mg (800 mg Oral Given 10/18/18 0041)     Initial Impression / Assessment and Plan / ED Course  I have reviewed the triage vital signs and the nursing notes.  Pertinent labs & imaging results that were available during my care of the patient were reviewed by me and considered in my medical decision making (see chart for details).  Patient presenting for symptoms of hyperglycemia in the setting of gestational diabetes.  She has been off of her metformin since diagnosis 1 month ago.  Noted to be hyperglycemic on work-up today, but without evidence of DKA.  No ketonuria.  She did have bacteriuria during her prior ED assessment.  This seems to have resolved.  CBG has improved with IV fluids.  We will start the patient back on her metformin.  She has been encouraged to follow-up with OB/GYN or a primary care doctor for recheck of her sugar levels.  As an aside, patient complaining of a "fishy odor" from her vaginal area.  Her wet prep is suggestive of bacterial vaginosis.  Will treat with Flagyl.  She continues to have vaginal bleeding in the setting of miscarriage 2 weeks ago.  States that her bleeding has significantly subsided and she has no complaints of abdominal pain, fevers, vomiting.  Patient instructed to have hCG trended in 48 hours by her OBGYN.  HCG is 82.3 today compared to >2000 at ED visit 1 month ago; reported onset of miscarriage 2 weeks ago.  Return precautions discussed and provided. Patient discharged in stable condition with no unaddressed concerns.   Final Clinical Impressions(s) / ED Diagnoses   Final diagnoses:  Hyperglycemia  Spontaneous miscarriage  Bacterial vaginosis    ED Discharge Orders         Ordered    metFORMIN (GLUCOPHAGE) 1000 MG tablet  2 times daily     10/18/18 0053    metroNIDAZOLE (FLAGYL) 500 MG tablet  2 times daily     10/18/18 0053           Antony MaduraHumes, Avrielle Fry, PA-C 10/18/18 0233    Loren RacerYelverton, David, MD  10/22/18 [phone]

## 2018-10-17 NOTE — ED Triage Notes (Signed)
Patient here from home with complaints of hyperglycemia. Reports that she was recently diagnosed with gestational diabetes but has since miscarried. Nausea and fatigue at home today.

## 2018-10-18 LAB — WET PREP, GENITAL
Sperm: NONE SEEN
Trich, Wet Prep: NONE SEEN
Yeast Wet Prep HPF POC: NONE SEEN

## 2018-10-18 MED ORDER — IBUPROFEN 800 MG PO TABS
800.0000 mg | ORAL_TABLET | Freq: Once | ORAL | Status: AC
  Administered 2018-10-18: 800 mg via ORAL
  Filled 2018-10-18: qty 1

## 2018-10-18 MED ORDER — METFORMIN HCL 1000 MG PO TABS: 1000.0000 mg | ORAL_TABLET | Freq: Two times a day (BID) | ORAL | 1 refills | Status: DC

## 2018-10-18 MED ORDER — METRONIDAZOLE 500 MG PO TABS: 500.0000 mg | ORAL_TABLET | Freq: Two times a day (BID) | ORAL | 0 refills | Status: DC

## 2018-10-18 NOTE — Discharge Instructions (Signed)
Continue metformin as prescribed and have your blood sugar rechecked by your primary care doctor and/your OB/GYN.  You should also have your blood pregnancy trended to ensure completion of your miscarriage.  Y a repeat test should be performed in 48 hours.    You were found to have clue cells on your wet prep which can be present with bacterial vaginosis.  Take Flagyl as prescribed until finished.  Do not drink alcohol while taking Flagyl as this will make you violently ill and cause you to vomit.  Continue to stay well-hydrated by drinking plenty of fluids.  You may return to the ED for any new or concerning symptoms.

## 2018-10-20 ENCOUNTER — Other Ambulatory Visit: Payer: Medicaid Other

## 2018-10-20 ENCOUNTER — Other Ambulatory Visit: Payer: Self-pay

## 2018-10-20 DIAGNOSIS — O039 Complete or unspecified spontaneous abortion without complication: Secondary | ICD-10-CM

## 2018-10-20 NOTE — Progress Notes (Signed)
Lab visit for HCG

## 2018-10-21 LAB — BETA HCG QUANT (REF LAB): hCG Quant: 52 m[IU]/mL

## 2018-10-28 ENCOUNTER — Other Ambulatory Visit: Payer: Medicaid Other

## 2018-11-02 ENCOUNTER — Ambulatory Visit: Payer: Medicaid Other | Admitting: Obstetrics and Gynecology

## 2018-11-09 ENCOUNTER — Telehealth: Payer: Self-pay | Admitting: Obstetrics

## 2019-03-27 ENCOUNTER — Other Ambulatory Visit: Payer: Self-pay

## 2019-03-27 ENCOUNTER — Emergency Department (HOSPITAL_COMMUNITY)
Admission: EM | Admit: 2019-03-27 | Discharge: 2019-03-28 | Disposition: A | Discharge status: Home / Self Care | Payer: Medicaid Other | Attending: Emergency Medicine | Admitting: Emergency Medicine

## 2019-03-27 ENCOUNTER — Encounter (HOSPITAL_COMMUNITY): Payer: Self-pay | Admitting: Emergency Medicine

## 2019-03-27 DIAGNOSIS — R531 Weakness: Secondary | ICD-10-CM | POA: Diagnosis present

## 2019-03-27 DIAGNOSIS — Z7984 Long term (current) use of oral hypoglycemic drugs: Secondary | ICD-10-CM | POA: Diagnosis not present

## 2019-03-27 DIAGNOSIS — R0789 Other chest pain: Secondary | ICD-10-CM | POA: Diagnosis not present

## 2019-03-27 DIAGNOSIS — Z9114 Patient's other noncompliance with medication regimen: Secondary | ICD-10-CM | POA: Diagnosis not present

## 2019-03-27 DIAGNOSIS — E1165 Type 2 diabetes mellitus with hyperglycemia: Secondary | ICD-10-CM | POA: Diagnosis not present

## 2019-03-27 DIAGNOSIS — Z87891 Personal history of nicotine dependence: Secondary | ICD-10-CM | POA: Insufficient documentation

## 2019-03-27 DIAGNOSIS — R739 Hyperglycemia, unspecified: Secondary | ICD-10-CM

## 2019-03-27 LAB — CBC WITH DIFFERENTIAL/PLATELET
Abs Immature Granulocytes: 0.03 10*3/uL (ref 0.00–0.07)
Basophils Absolute: 0.1 10*3/uL (ref 0.0–0.1)
Basophils Relative: 1 %
Eosinophils Absolute: 0.1 10*3/uL (ref 0.0–0.5)
Eosinophils Relative: 2 %
HCT: 43.4 % (ref 36.0–46.0)
Hemoglobin: 13.6 g/dL (ref 12.0–15.0)
Immature Granulocytes: 0 %
Lymphocytes Relative: 42 %
Lymphs Abs: 2.9 10*3/uL (ref 0.7–4.0)
MCH: 26.8 pg (ref 26.0–34.0)
MCHC: 31.3 g/dL (ref 30.0–36.0)
MCV: 85.4 fL (ref 80.0–100.0)
Monocytes Absolute: 0.5 10*3/uL (ref 0.1–1.0)
Monocytes Relative: 8 %
Neutro Abs: 3.3 10*3/uL (ref 1.7–7.7)
Neutrophils Relative %: 47 %
Platelets: 257 10*3/uL (ref 150–400)
RBC: 5.08 MIL/uL (ref 3.87–5.11)
RDW: 14.2 % (ref 11.5–15.5)
WBC: 7 10*3/uL (ref 4.0–10.5)
nRBC: 0 % (ref 0.0–0.2)

## 2019-03-27 LAB — URINALYSIS, ROUTINE W REFLEX MICROSCOPIC
Bacteria, UA: NONE SEEN
Bilirubin Urine: NEGATIVE
Glucose, UA: >=500 mg/dL — AB
Ketones, ur: NEGATIVE mg/dL
Leukocytes,Ua: NEGATIVE
Nitrite: NEGATIVE
Protein, ur: NEGATIVE mg/dL
Specific Gravity, Urine: 1.038 — ABNORMAL HIGH (ref 1.005–1.030)
pH: 7 (ref 5.0–8.0)

## 2019-03-27 LAB — COMPREHENSIVE METABOLIC PANEL
ALT: 21 U/L (ref 0–44)
AST: 20 U/L (ref 15–41)
Albumin: 4.1 g/dL (ref 3.5–5.0)
Alkaline Phosphatase: 53 U/L (ref 38–126)
Anion gap: 14 (ref 5–15)
BUN: 10 mg/dL (ref 6–20)
CO2: 22 mmol/L (ref 22–32)
Calcium: 9.2 mg/dL (ref 8.9–10.3)
Chloride: 99 mmol/L (ref 98–111)
Creatinine, Ser: 0.87 mg/dL (ref 0.44–1.00)
GFR calc Af Amer: >60 mL/min (ref >60)
GFR calc non Af Amer: >60 mL/min (ref >60)
Glucose, Bld: 432 mg/dL — ABNORMAL HIGH (ref 70–99)
Potassium: 4.1 mmol/L (ref 3.5–5.1)
Sodium: 135 mmol/L (ref 135–145)
Total Bilirubin: 0.7 mg/dL (ref 0.3–1.2)
Total Protein: 7.1 g/dL (ref 6.5–8.1)

## 2019-03-27 LAB — TROPONIN I (HIGH SENSITIVITY): Troponin I (High Sensitivity): <2 ng/L (ref <18)

## 2019-03-27 LAB — I-STAT BETA HCG BLOOD, ED (MC, WL, AP ONLY): I-stat hCG, quantitative: <5 m[IU]/mL (ref <5)

## 2019-03-27 LAB — CBG MONITORING, ED: Glucose-Capillary: 394 mg/dL — ABNORMAL HIGH (ref 70–99)

## 2019-03-27 MED ORDER — INSULIN ASPART 100 UNIT/ML ~~LOC~~ SOLN
4.0000 [IU] | Freq: Once | SUBCUTANEOUS | Status: AC
  Administered 2019-03-27: 4 [IU] via INTRAVENOUS

## 2019-03-27 MED ORDER — SODIUM CHLORIDE 0.9 % IV BOLUS
1000.0000 mL | Freq: Once | INTRAVENOUS | Status: AC
  Administered 2019-03-27: 22:00:00 1000 mL via INTRAVENOUS

## 2019-03-27 NOTE — ED Triage Notes (Signed)
Patient reports fatigue/generalized weakness and feeling dehydrated this week , she has not taken her Metformin today . CBG= 394.

## 2019-03-27 NOTE — ED Provider Notes (Signed)
[facility] EMERGENCY DEPARTMENT Provider Note   CSN: [id number] Arrival date & time: 03/27/19  1931     History Chief Complaint  Patient presents with  . Generalized weakness    Gen. Weakness    Kelsey Jones is a 29 y.o. female past medical history of diabetes being treated with Metformin who presents for evaluation of fatigue, generalized weakness, hyperglycemia.  She states that over the last 2 to 3 days, she has noticed she has been more tired and feels like she has been dehydrated.  She states that she felt like her blood sugar was high, prompting ED visit.  She does have a history of type 2 diabetes and is post be on Metformin.  She states she has not taken the Metformin in about 4 months because she does not like the way it makes her feel.  She states that she has not had any nausea/vomiting or abdominal pain.  She states she has not noted any fevers at home.  She states that she originally was diagnosed with gestational diabetes and was followed by her OB/GYN.  She did transition to diabetes and did not obtain primary care follow-up.  She also reports that over the last 3 to 4 weeks, she has had intermittent chest pain.  She describes these pains as sharp and states that sometimes they occur in her left upper chest, sometimes in the right lower chest.  They are not  associated with any exertional symptoms.  She denies any associated diaphoresis, nausea/vomiting. She denies any OCP use, recent immobilization, prior history of DVT/PE, recent surgery, leg swelling, or long travel.  Patient denies any fevers, Donnell pain, nausea/vomiting, urinary complaints.  She denies any recent travel or known COVID-19 exposure.  The history is provided by the patient.       Past Medical History:  Diagnosis Date  . Gestational diabetes    metformin  . Medical history non-contributory   . Short cervix     Patient Active Problem List   Diagnosis Date Noted  . Gestational  diabetes 08/03/2016  . Medication controlled gestational diabetes mellitus in third trimester 05/16/2016  . Short cervix affecting pregnancy 04/04/2016  . Low vitamin D level 01/14/2016  . Supervision of high risk pregnancy, antepartum 01/09/2016  . Prior pregnancy complicated by short cervix, antepartum 01/09/2016  . ASCUS pap smear but negative high-risk HPV on 10/16/2015.    10/23/2015    Past Surgical History:  Procedure Laterality Date  . INDUCED ABORTION       OB History    Gravida  3   Para  2   Term  2   Preterm  0   AB  1   Living  2     SAB  0   TAB  1   Ectopic  0   Multiple  0   Live Births  2           Family History  Problem Relation Age of Onset  . Diabetes Mother   . Cancer Maternal Grandmother   . Diabetes Maternal Grandmother   . Cancer Maternal Grandfather     Social History   Tobacco Use  . Smoking status: Former Smoker    Types: Cigars  . Smokeless tobacco: Never Used  Substance Use Topics  . Alcohol use: No    Comment: Occasionally  . Drug use: No    Home Medications Prior to Admission medications   Medication Sig Start Date End Date  Taking? Authorizing Provider  acetaminophen (TYLENOL) 500 MG tablet Take 500 mg by mouth every 6 (six) hours as needed for headache (cramps).   Yes [provider]  aspirin-acetaminophen-caffeine (EXCEDRIN MIGRAINE) [id number] MG tablet Take 1 tablet by mouth every 6 (six) hours as needed for headache.   Yes [provider]  metFORMIN (GLUCOPHAGE) 1000 MG tablet Take 1 tablet (1,000 mg total) by mouth 2 (two) times daily. 10/18/18  Yes Antonietta Breach, PA-C  blood glucose meter kit and supplies KIT Dispense based on patient and insurance preference. Use up to four times daily as directed. (FOR ICD-9 250.00, 250.01). 03/28/19   Volanda Napoleon, PA-C    Allergies    Patient has no known allergies.  Review of Systems   Review of Systems  Constitutional: Positive for fatigue.  Negative for fever.  Respiratory: Negative for cough and shortness of breath.   Cardiovascular: Positive for chest pain.  Gastrointestinal: Negative for abdominal pain, nausea and vomiting.  Genitourinary: Negative for dysuria and hematuria.  Neurological: Negative for headaches.  All other systems reviewed and are negative.   Physical Exam Updated Vital Signs BP 125/83   Pulse 67   Temp 98 F (36.7 C) (Oral)   Resp 12   Ht '5\' 7"'$  (1.702 m)   Wt 77.1 kg   LMP 03/26/2019   SpO2 99%   BMI 26.63 kg/m   Physical Exam Vitals and nursing note reviewed.  Constitutional:      Appearance: Normal appearance. She is well-developed.  HENT:     Head: Normocephalic and atraumatic.  Eyes:     General: Lids are normal.     Conjunctiva/sclera: Conjunctivae normal.     Pupils: Pupils are equal, round, and reactive to light.  Cardiovascular:     Rate and Rhythm: Normal rate and regular rhythm.     Pulses: Normal pulses.     Heart sounds: Normal heart sounds. No murmur. No friction rub. No gallop.   Pulmonary:     Effort: Pulmonary effort is normal.     Breath sounds: Normal breath sounds.     Comments: Lungs clear to auscultation bilaterally.  Symmetric chest rise.  No wheezing, rales, rhonchi. Abdominal:     Palpations: Abdomen is soft. Abdomen is not rigid.     Tenderness: There is no abdominal tenderness. There is no guarding.     Comments: Abdomen is soft, non-distended, non-tender. No rigidity, No guarding. No peritoneal signs.  Musculoskeletal:        General: Normal range of motion.     Cervical back: Full passive range of motion without pain.  Skin:    General: Skin is warm and dry.     Capillary Refill: Capillary refill takes less than 2 seconds.  Neurological:     Mental Status: She is alert and oriented to person, place, and time.  Psychiatric:        Speech: Speech normal.     ED Results / Procedures / Treatments   Labs (all labs ordered are listed, but only  abnormal results are displayed) Labs Reviewed  COMPREHENSIVE METABOLIC PANEL - Abnormal; Notable for the following components:      Result Value   Glucose, Bld 432 (*)    All other components within normal limits  URINALYSIS, ROUTINE W REFLEX MICROSCOPIC - Abnormal; Notable for the following components:   Color, Urine STRAW (*)    Specific Gravity, Urine 1.038 (*)    Glucose, UA >=500 (*)    Hgb  urine dipstick LARGE (*)    All other components within normal limits  CBG MONITORING, ED - Abnormal; Notable for the following components:   Glucose-Capillary 394 (*)    All other components within normal limits  CBG MONITORING, ED - Abnormal; Notable for the following components:   Glucose-Capillary 192 (*)    All other components within normal limits  CBC WITH DIFFERENTIAL/PLATELET  I-STAT BETA HCG BLOOD, ED ([facility], [facility], [facility] ONLY)  TROPONIN I (HIGH SENSITIVITY)  TROPONIN I (HIGH SENSITIVITY)    EKG EKG Interpretation  Date/Time:  _11  year old female past history of diabetes who presents for evaluation of fatigue, feeling  dehydrated, hyperglycemia.  She states for the last couple days, she felt like her blood sugar was high.  She supposed be on Metformin but states she has not been taking it since August 2020.  Denies any abdominal pain, nausea/vomiting.  Also reports intermittent chest pain has been ongoing for last several weeks.  Not associated with any exertional activity.  No associated diaphoresis, nausea.  Reports sharp pains that occur intermittently and occur at different places.  Patient is afebrile, non-toxic appearing, sitting comfortably on examination table. Vital signs reviewed and stable.  Patient with benign abdominal exam.  Plan for labs, fluids.  Low suspicion for DKA but also possibility given her hyperglycemia.  Suspect her hypoglycemia is secondary to medication noncompliance.  Additionally, her story is atypical for ACS etiology.  Doubt PE.  She does not have any risk factors and she is not currently tachycardic or hypoxic. Patient is PERC negative and is low risk for PE.   CMP shows glucose of 432.  Bicarb is 22.  Anion gap is 14.  I-STAT beta is negative.  CBC shows no leukocytosis. Urine shows no ketones but does have glucosuria.  Work-up is not concerning for DKA.  Additionally, her troponin is negative.  Her EKG is normal.  Given atypical nature of her chest pain, do not feel this is representative of ACS etiology.  Repeat CBG shows glucose of 192.   At this time, patient's work-up is not concerning for DKA.  I discussed results with patient.  I discussed with her the importance of taking her metformin.  Patient states she does not need a refill of her Metformin as she already has the medication.  He does report that she does not have a primary care doctor as her diabetes had previously been handled by OB/GYN.  Patient be given referral to [facility] wellness.  I discussed with patient regarding  taking her Metformin. At this time, patient exhibits no emergent life-threatening condition that require further  evaluation in ED or admission. Patient had ample opportunity for questions and discussion. All patient's questions were answered with full understanding. Strict return precautions discussed. Patient expresses understanding and agreement to plan.   Portions of this note were generated with Lobbyist. Dictation errors may occur despite best attempts at proofreading.  Final Clinical Impression(s) / ED Diagnoses Final diagnoses:  Hyperglycemia  Atypical chest pain    Rx / DC Orders ED Discharge Orders         Ordered    blood glucose meter kit and supplies KIT     03/28/19 0038           Volanda Napoleon, PA-C 03/28/19 2154    Isla Pence, MD 03/30/19 [phone]

## 2019-03-28 ENCOUNTER — Emergency Department (HOSPITAL_COMMUNITY): Payer: Medicaid Other

## 2019-03-28 LAB — CBG MONITORING, ED: Glucose-Capillary: 192 mg/dL — ABNORMAL HIGH (ref 70–99)

## 2019-03-28 LAB — TROPONIN I (HIGH SENSITIVITY): Troponin I (High Sensitivity): <2 ng/L (ref <18)

## 2019-03-28 MED ORDER — BLOOD GLUCOSE MONITOR KIT: PACK | 0 refills | Status: DC

## 2019-03-28 NOTE — Discharge Instructions (Addendum)
As we discussed, it is very important for you to take your Metformin.  Please take it as directed.  Please follow-up with [facility] to establish a primary care doctor.   I provided you with a prescription for new glucose meter.  Return the emergency department for any abdominal pain, vomiting or any other worsening or concerning symptoms.

## 2019-04-26 ENCOUNTER — Ambulatory Visit (HOSPITAL_COMMUNITY)
Admission: EM | Admit: 2019-04-26 | Discharge: 2019-04-26 | Disposition: A | Discharge status: Home / Self Care | Payer: Medicaid Other | Attending: Family Medicine | Admitting: Family Medicine

## 2019-04-26 ENCOUNTER — Other Ambulatory Visit: Payer: Self-pay

## 2019-04-26 ENCOUNTER — Encounter (HOSPITAL_COMMUNITY): Payer: Self-pay | Admitting: Emergency Medicine

## 2019-04-26 DIAGNOSIS — A549 Gonococcal infection, unspecified: Secondary | ICD-10-CM

## 2019-04-26 MED ORDER — LIDOCAINE HCL (PF) 1 % IJ SOLN
INTRAMUSCULAR | Status: AC
  Filled 2019-04-26: qty 2

## 2019-04-26 MED ORDER — FLUCONAZOLE 150 MG PO TABS: ORAL_TABLET | ORAL | 0 refills | Status: DC

## 2019-04-26 MED ORDER — CEFTRIAXONE SODIUM 500 MG IJ SOLR
INTRAMUSCULAR | Status: AC
  Filled 2019-04-26: qty 500

## 2019-04-26 MED ORDER — CEFTRIAXONE SODIUM 500 MG IJ SOLR
500.0000 mg | Freq: Once | INTRAMUSCULAR | Status: AC
  Administered 2019-04-26: 500 mg via INTRAMUSCULAR

## 2019-04-26 NOTE — ED Provider Notes (Signed)
[facility] CARE CENTER   [id number] 04/26/19 Arrival Time: 1805  ASSESSMENT & PLAN:  1. Gonorrhea     No s/s of PID.   Discharge Instructions     You have been given the following today for treatment of gonorrhea  cefTRIAXone (ROCEPHIN) injection 500 mg  Please refrain from all sexual activity for at least the next seven days.     Reviewed expectations re: course of current medical issues. Questions answered. Outlined signs and symptoms indicating need for more acute intervention. Patient verbalized understanding. After Visit Summary given.   SUBJECTIVE:  Kelsey Jones is a 29 y.o. female who reports testing positive for gonorrhea; informed by health department. With some vag d/c. No specific aggravating or alleviating factors reported. Denies: urinary frequency, dysuria and gross hematuria. Afebrile. No abdominal or pelvic pain. Normal PO intake wihout n/v. No genital rashes or lesions. Reports that she is sexually active. OTC treatment: none. History of STI: unknown.  No LMP recorded.  ROS: As per HPI.  OBJECTIVE:  Vitals:   04/26/19 1900  BP: 116/79  Pulse: 73  Resp: 18  Temp: 99.2 F (37.3 C)  TempSrc: Oral  SpO2: 98%     General appearance: alert, cooperative, appears stated age and no distress Lungs: unlabored respirations Back: no CVA tenderness; FROM at waist Abdomen: soft, non-tender GU: deferred Skin: warm and dry Psychological: alert and cooperative; normal mood and affect.    Labs Reviewed - No data to display  No Known Allergies  Past Medical History:  Diagnosis Date  . Gestational diabetes    metformin  . Medical history non-contributory   . Short cervix    Family History  Problem Relation Age of Onset  . Diabetes Mother   . Cancer Maternal Grandmother   . Diabetes Maternal Grandmother   . Cancer Maternal Grandfather    Social History   Socioeconomic History  . Marital status: Single    Spouse name: Not on file    . Number of children: Not on file  . Years of education: Not on file  . Highest education level: Not on file  Occupational History  . Not on file  Tobacco Use  . Smoking status: Former Smoker    Types: Cigars  . Smokeless tobacco: Never Used  Substance and Sexual Activity  . Alcohol use: No    Comment: Occasionally  . Drug use: No  . Sexual activity: Yes    Birth control/protection: None  Other Topics Concern  . Not on file  Social History Narrative  . Not on file   Social Determinants of Health   Financial Resource Strain:   . Difficulty of Paying Living Expenses: Not on file  Food Insecurity:   . Worried About Programme researcher, broadcasting/film/video in the Last Year: Not on file  . Ran Out of Food in the Last Year: Not on file  Transportation Needs:   . Lack of Transportation (Medical): Not on file  . Lack of Transportation (Non-Medical): Not on file  Physical Activity:   . Days of Exercise per Week: Not on file  . Minutes of Exercise per Session: Not on file  Stress:   . Feeling of Stress : Not on file  Social Connections:   . Frequency of Communication with Friends and Family: Not on file  . Frequency of Social Gatherings with Friends and Family: Not on file  . Attends Religious Services: Not on file  . Active Member of Clubs or Organizations: Not on file  .  Attends Archivist Meetings: Not on file  . Marital Status: Not on file  Intimate Partner Violence:   . Fear of Current or Ex-Partner: Not on file  . Emotionally Abused: Not on file  . Physically Abused: Not on file  . Sexually Abused: Not on file          Vanessa Kick, MD 04/27/19 [phone]

## 2019-04-26 NOTE — ED Triage Notes (Signed)
Pt sts she tested positive for gonorrhea at health department and is here for treatment

## 2019-04-26 NOTE — Discharge Instructions (Addendum)
You have been given the following today for treatment of gonorrhea: cefTRIAXone (ROCEPHIN) injection 500 mg  Please refrain from all sexual activity for at least the next seven days.  

## 2019-11-09 ENCOUNTER — Ambulatory Visit (HOSPITAL_COMMUNITY)
Admission: EM | Admit: 2019-11-09 | Discharge: 2019-11-09 | Disposition: A | Discharge status: Home / Self Care | Payer: Medicaid Other | Attending: Internal Medicine | Admitting: Internal Medicine

## 2019-11-09 ENCOUNTER — Other Ambulatory Visit: Payer: Self-pay

## 2019-11-09 ENCOUNTER — Encounter (HOSPITAL_COMMUNITY): Payer: Self-pay | Admitting: Emergency Medicine

## 2019-11-09 DIAGNOSIS — R109 Unspecified abdominal pain: Secondary | ICD-10-CM

## 2019-11-09 DIAGNOSIS — Z3201 Encounter for pregnancy test, result positive: Secondary | ICD-10-CM

## 2019-11-09 LAB — POCT URINALYSIS DIPSTICK, ED / UC
Bilirubin Urine: NEGATIVE
Glucose, UA: >=1000 mg/dL — AB
Hgb urine dipstick: NEGATIVE
Ketones, ur: NEGATIVE mg/dL
Leukocytes,Ua: NEGATIVE
Nitrite: NEGATIVE
Protein, ur: NEGATIVE mg/dL
Specific Gravity, Urine: >=1.03 (ref 1.005–1.030)
Urobilinogen, UA: 0.2 mg/dL (ref 0.0–1.0)
pH: 6 (ref 5.0–8.0)

## 2019-11-09 LAB — POC URINE PREG, ED: Preg Test, Ur: POSITIVE — AB

## 2019-11-09 NOTE — Discharge Instructions (Addendum)
[facility]

## 2019-11-09 NOTE — ED Triage Notes (Signed)
Pt presents to [facility] for assessment stating she is 3 months pregnant with 1.5 weeks of abdominal pain after having sex.  Patient states discharge as well.

## 2019-11-09 NOTE — ED Notes (Signed)
Patient is being discharged from the Urgent Care and sent to the Emergency Department via private vehicle. Per Dahlia Byes, APP, patient is in need of higher level of care due to pregnancy, abdominal pain, diabetic, and no prenatal care. Patient is aware and verbalizes understanding of plan of care.  Vitals:   11/09/19 0835  BP: 120/67  Pulse: 96  Resp: 18  Temp: 98.8 F (37.1 C)  SpO2: 99%

## 2019-11-10 ENCOUNTER — Other Ambulatory Visit: Payer: Self-pay

## 2019-11-10 ENCOUNTER — Inpatient Hospital Stay (HOSPITAL_COMMUNITY): Payer: Medicaid Other

## 2019-11-10 ENCOUNTER — Inpatient Hospital Stay (HOSPITAL_BASED_OUTPATIENT_CLINIC_OR_DEPARTMENT_OTHER): Payer: Medicaid Other

## 2019-11-10 ENCOUNTER — Inpatient Hospital Stay (HOSPITAL_COMMUNITY)
Admission: AD | Admit: 2019-11-10 | Discharge: 2019-11-10 | Disposition: A | Discharge status: Home / Self Care | Payer: Medicaid Other | Attending: Obstetrics and Gynecology | Admitting: Obstetrics and Gynecology

## 2019-11-10 ENCOUNTER — Encounter (HOSPITAL_COMMUNITY): Payer: Self-pay | Admitting: Obstetrics and Gynecology

## 2019-11-10 DIAGNOSIS — O2241 Hemorrhoids in pregnancy, first trimester: Secondary | ICD-10-CM | POA: Diagnosis not present

## 2019-11-10 DIAGNOSIS — Z7984 Long term (current) use of oral hypoglycemic drugs: Secondary | ICD-10-CM | POA: Diagnosis not present

## 2019-11-10 DIAGNOSIS — Z79899 Other long term (current) drug therapy: Secondary | ICD-10-CM | POA: Insufficient documentation

## 2019-11-10 DIAGNOSIS — O98812 Other maternal infectious and parasitic diseases complicating pregnancy, second trimester: Secondary | ICD-10-CM | POA: Diagnosis not present

## 2019-11-10 DIAGNOSIS — B373 Candidiasis of vulva and vagina: Secondary | ICD-10-CM | POA: Insufficient documentation

## 2019-11-10 DIAGNOSIS — O99891 Other specified diseases and conditions complicating pregnancy: Secondary | ICD-10-CM | POA: Diagnosis not present

## 2019-11-10 DIAGNOSIS — O24415 Gestational diabetes mellitus in pregnancy, controlled by oral hypoglycemic drugs: Secondary | ICD-10-CM | POA: Insufficient documentation

## 2019-11-10 DIAGNOSIS — O0932 Supervision of pregnancy with insufficient antenatal care, second trimester: Secondary | ICD-10-CM | POA: Insufficient documentation

## 2019-11-10 DIAGNOSIS — B3731 Acute candidiasis of vulva and vagina: Secondary | ICD-10-CM

## 2019-11-10 DIAGNOSIS — O98811 Other maternal infectious and parasitic diseases complicating pregnancy, first trimester: Secondary | ICD-10-CM | POA: Diagnosis present

## 2019-11-10 DIAGNOSIS — R109 Unspecified abdominal pain: Secondary | ICD-10-CM

## 2019-11-10 DIAGNOSIS — Z7982 Long term (current) use of aspirin: Secondary | ICD-10-CM | POA: Insufficient documentation

## 2019-11-10 DIAGNOSIS — Z833 Family history of diabetes mellitus: Secondary | ICD-10-CM | POA: Insufficient documentation

## 2019-11-10 DIAGNOSIS — Z87891 Personal history of nicotine dependence: Secondary | ICD-10-CM | POA: Diagnosis not present

## 2019-11-10 DIAGNOSIS — Z3A15 15 weeks gestation of pregnancy: Secondary | ICD-10-CM

## 2019-11-10 LAB — COMPREHENSIVE METABOLIC PANEL
ALT: 14 U/L (ref 0–44)
AST: 15 U/L (ref 15–41)
Albumin: 3.5 g/dL (ref 3.5–5.0)
Alkaline Phosphatase: 52 U/L (ref 38–126)
Anion gap: 10 (ref 5–15)
BUN: 9 mg/dL (ref 6–20)
CO2: 21 mmol/L — ABNORMAL LOW (ref 22–32)
Calcium: 9.3 mg/dL (ref 8.9–10.3)
Chloride: 101 mmol/L (ref 98–111)
Creatinine, Ser: 0.56 mg/dL (ref 0.44–1.00)
GFR calc Af Amer: >60 mL/min (ref >60)
GFR calc non Af Amer: >60 mL/min (ref >60)
Glucose, Bld: 210 mg/dL — ABNORMAL HIGH (ref 70–99)
Potassium: 3.9 mmol/L (ref 3.5–5.1)
Sodium: 132 mmol/L — ABNORMAL LOW (ref 135–145)
Total Bilirubin: 0.3 mg/dL (ref 0.3–1.2)
Total Protein: 7.1 g/dL (ref 6.5–8.1)

## 2019-11-10 LAB — WET PREP, GENITAL
Clue Cells Wet Prep HPF POC: NONE SEEN
Sperm: NONE SEEN
Trich, Wet Prep: NONE SEEN

## 2019-11-10 LAB — CBC
HCT: 38.5 % (ref 36.0–46.0)
Hemoglobin: 12.4 g/dL (ref 12.0–15.0)
MCH: 26.5 pg (ref 26.0–34.0)
MCHC: 32.2 g/dL (ref 30.0–36.0)
MCV: 82.3 fL (ref 80.0–100.0)
Platelets: 224 10*3/uL (ref 150–400)
RBC: 4.68 MIL/uL (ref 3.87–5.11)
RDW: 13.1 % (ref 11.5–15.5)
WBC: 9.9 10*3/uL (ref 4.0–10.5)
nRBC: 0 % (ref 0.0–0.2)

## 2019-11-10 MED ORDER — TERCONAZOLE 0.4 % VA CREA: 1.0000 | TOPICAL_CREAM | Freq: Every day | VAGINAL | 0 refills | Status: DC

## 2019-11-10 MED ORDER — PREPLUS 27-1 MG PO TABS: 1.0000 | ORAL_TABLET | Freq: Every day | ORAL | 13 refills | Status: DC

## 2019-11-10 NOTE — ED Provider Notes (Signed)
[place]    CSN: [id number] Arrival date & time: 11/09/19  0813      History   Chief Complaint Chief Complaint  Patient presents with  . Abdominal Pain  . Possible Pregnancy    HPI Kelsey Jones is a 29 y.o. female.   Patient is a 29 year old female presents today with stating she is approximately 3 months pregnant.  She has had approximate 1/2 weeks of abdominal pain daily after having sex.  She has had some mild discharge.  Patient has not had any OB/GYN follow-up since being pregnant.  Denies any vaginal bleeding.  No dysuria, hematuria or urinary frequency.  She has no diabetic and is taking Metformin     Past Medical History:  Diagnosis Date  . Gestational diabetes    metformin  . Medical history non-contributory   . Short cervix     Patient Active Problem List   Diagnosis Date Noted  . Gestational diabetes 08/03/2016  . Medication controlled gestational diabetes mellitus in third trimester 05/16/2016  . Short cervix affecting pregnancy 04/04/2016  . Low vitamin D level 01/14/2016  . Supervision of high risk pregnancy, antepartum 01/09/2016  . Prior pregnancy complicated by short cervix, antepartum 01/09/2016  . ASCUS pap smear but negative high-risk HPV on 10/16/2015.    10/23/2015    Past Surgical History:  Procedure Laterality Date  . INDUCED ABORTION      OB History    Gravida  3   Para  2   Term  2   Preterm  0   AB  1   Living  2     SAB  0   TAB  1   Ectopic  0   Multiple  0   Live Births  2            Home Medications    Prior to Admission medications   Medication Sig Start Date End Date Taking? Authorizing Provider  acetaminophen (TYLENOL) 500 MG tablet Take 500 mg by mouth every 6 (six) hours as needed for headache (cramps).    [provider]  aspirin-acetaminophen-caffeine (EXCEDRIN MIGRAINE) [phone] MG tablet Take 1 tablet by mouth every 6 (six) hours as needed for headache.     [provider]  blood glucose meter kit and supplies KIT Dispense based on patient and insurance preference. Use up to four times daily as directed. (FOR ICD-9 250.00, 250.01). 03/28/19   Volanda Napoleon, PA-C  metFORMIN (GLUCOPHAGE) 1000 MG tablet Take 1 tablet (1,000 mg total) by mouth 2 (two) times daily. 10/18/18   Antonietta Breach, PA-C    Family History Family History  Problem Relation Age of Onset  . Diabetes Mother   . Cancer Maternal Grandmother   . Diabetes Maternal Grandmother   . Cancer Maternal Grandfather     Social History Social History   Tobacco Use  . Smoking status: Former Smoker    Types: Cigars  . Smokeless tobacco: Never Used  Substance Use Topics  . Alcohol use: No    Comment: Occasionally  . Drug use: No     Allergies   Patient has no known allergies.   Review of Systems Review of Systems   Physical Exam Triage Vital Signs ED Triage Vitals  Enc Vitals Group     BP 11/09/19 0835 120/67     Pulse Rate 11/09/19 0835 96     Resp 11/09/19 0835 18     Temp 11/09/19 0835 98.8 F (37.1  C)     Temp Source 11/09/19 0835 Oral     SpO2 11/09/19 0835 99 %     Weight --      Height --      Head Circumference --      Peak Flow --      Pain Score 11/09/19 0836 6     Pain Loc --      Pain Edu? --      Excl. in [place]? --    No data found.  Updated Vital Signs BP 120/67 (BP Location: Left Arm)   Pulse 96   Temp 98.8 F (37.1 C) (Oral)   Resp 18   LMP 07/24/2019   SpO2 99%   Visual Acuity Right Eye Distance:   Left Eye Distance:   Bilateral Distance:    Right Eye Near:   Left Eye Near:    Bilateral Near:     Physical Exam Vitals and nursing note reviewed.  Constitutional:      General: She is not in acute distress.    Appearance: Normal appearance. She is not ill-appearing, toxic-appearing or diaphoretic.  HENT:     Head: Normocephalic.     Nose: Nose normal.  Eyes:     Conjunctiva/sclera: Conjunctivae normal.  Pulmonary:      Effort: Pulmonary effort is normal.  Musculoskeletal:        General: Normal range of motion.     Cervical back: Normal range of motion.  Skin:    General: Skin is warm and dry.     Findings: No rash.  Neurological:     Mental Status: She is alert.  Psychiatric:        Mood and Affect: Mood normal.      UC Treatments / Results  Labs (all labs ordered are listed, but only abnormal results are displayed) Labs Reviewed  POCT URINALYSIS DIPSTICK, ED / UC - Abnormal; Notable for the following components:      Result Value   Glucose, UA >=1000 (*)    All other components within normal limits  POC URINE PREG, ED - Abnormal; Notable for the following components:   Preg Test, Ur POSITIVE (*)    All other components within normal limits    EKG   Radiology No results found.  Procedures Procedures (including critical care time)  Medications Ordered in UC Medications - No data to display  Initial Impression / Assessment and Plan / UC Course  I have reviewed the triage vital signs and the nursing notes.  Pertinent labs & imaging results that were available during my care of the patient were reviewed by me and considered in my medical decision making (see chart for details).     Patient with positive pregnancy test here.  She has had no prenatal care.  She has had approximate 1/2-week of abdominal pain post coital. Recommend going [facility] for further work-up and ultrasound Final Clinical Impressions(s) / UC Diagnoses   Final diagnoses:  Positive pregnancy test  Abdominal pain, unspecified abdominal location     Discharge Instructions     Please go to the women's hospital for further work up    ED Prescriptions    None     PDMP not reviewed this encounter.   Loura Halt A, NP 11/10/19 1254

## 2019-11-10 NOTE — MAU Note (Signed)
. °  Kelsey Jones is a 29 y.o. at [redacted]w[redacted]d here in MAU reporting: sharp lower abdominal pain and discomfort in her lower back. Denies any vaginal bleeding or abnormal discharge LMP: 07/24/19 Onset of complaint: week and a half Pain score: 6 Vitals:   11/10/19 1657 11/10/19 1658  BP:  119/75  Pulse:  86  Resp:  18  Temp:  98.2 F (36.8 C)  SpO2: 100%      FHT:166 Lab orders placed from triage: UA

## 2019-11-10 NOTE — Discharge Instructions (Signed)

## 2019-11-10 NOTE — MAU Provider Note (Signed)
History     CSN: [id number]  Arrival date and time: 11/10/19 1642   First Provider Initiated Contact with Patient 11/10/19 1720      Chief Complaint  Patient presents with  . Abdominal Pain   HPI [name] is a 29 y.o. [id number] at 49w4dby certain LMP who presents to MAU with multiple complaints. She denies vaginal bleeding, dysuria, fever or recent illness. Her pregnancy was confirmed at Urgent Care yesterday 11/09/2019.  Abdominal Pain This is a new problem, onset 7-10 days ago. Patient pain typically occurs after sexual intercourse. She rates her suprapubic pain as 6/10. She denies aggravating or alleviating factors. She has not taken medication or tried other treatments for this complaint.  Hemorrhoids This is a recurrent problem. Patient reports hemorrhoids with her previous pregnancy which never resolved. She requests a regimen for her hemorrhoids.  Abnormal Discharge Patient endorses perineal itching as well as concern for a yeast infection. She is unsure of symptom onset but requests treatment for abnormal discharge.  Patient has not established prenatal care. No imaging during this pregnancy.  OB History    Gravida  4   Para  2   Term  2   Preterm  0   AB  1   Living  2     SAB  0   TAB  1   Ectopic  0   Multiple  0   Live Births  2           Past Medical History:  Diagnosis Date  . Gestational diabetes    metformin  . Medical history non-contributory   . Short cervix     Past Surgical History:  Procedure Laterality Date  . INDUCED ABORTION      Family History  Problem Relation Age of Onset  . Diabetes Mother   . Cancer Maternal Grandmother   . Diabetes Maternal Grandmother   . Cancer Maternal Grandfather     Social History   Tobacco Use  . Smoking status: Former Smoker    Types: Cigars  . Smokeless tobacco: Never Used  Vaping Use  . Vaping Use: Never used  Substance Use Topics  . Alcohol use: No    Comment:  Occasionally  . Drug use: No    Allergies: No Known Allergies  Medications Prior to Admission  Medication Sig Dispense Refill Last Dose  . metFORMIN (GLUCOPHAGE) 1000 MG tablet Take 1 tablet (1,000 mg total) by mouth 2 (two) times daily. 60 tablet 1 Past Week at Unknown time  . acetaminophen (TYLENOL) 500 MG tablet Take 500 mg by mouth every 6 (six) hours as needed for headache (cramps).     .Marland Kitchenaspirin-acetaminophen-caffeine (EXCEDRIN MIGRAINE) 250-250-65 MG tablet Take 1 tablet by mouth every 6 (six) hours as needed for headache.     . blood glucose meter kit and supplies KIT Dispense based on patient and insurance preference. Use up to four times daily as directed. (FOR ICD-9 250.00, 250.01). 1 each 0     Review of Systems  Gastrointestinal: Positive for abdominal pain.  Genitourinary: Positive for vaginal discharge. Negative for vaginal bleeding.  Musculoskeletal: Positive for back pain.  All other systems reviewed and are negative.  Physical Exam   Blood pressure 119/75, pulse 86, temperature 98.2 F (36.8 C), resp. rate 18, height _0  (1.727 m), weight 76.2 kg, last menstrual period 07/24/2019, SpO2 100 %, unknown if currently breastfeeding.  Physical Exam Vitals and nursing note reviewed. Exam conducted with a  chaperone present.  Constitutional:      Appearance: She is well-developed.  Cardiovascular:     Rate and Rhythm: Normal rate.  Abdominal:     General: Abdomen is flat. Bowel sounds are normal. There is no distension.     Palpations: Abdomen is soft.     Tenderness: There is no abdominal tenderness. There is no right CVA tenderness or left CVA tenderness.  Genitourinary:    Vagina: Vaginal discharge present.     Comments: Pelvic exam: External genitalia normal, vaginal walls pink and well rugated, cervix visually closed, no lesions noted. Small amount of thick white discharge near cervical os  Neurological:     Mental Status: She is alert.     MAU Course   Procedures: speculum exam, OB MFM Limited ultrasound  --Normal UA at Urgent Care yesterday. Not re-accomplished --No hemorrhoids observed on exam. Discussed high fiber, colace, water intake as factors  Orders Placed This Encounter  Procedures  . Wet prep, genital  . [place] MFM OB Limited  . CBC  . Comprehensive metabolic panel  . ABO/Rh  . Discharge patient   Patient Vitals for the past 24 hrs:  BP Temp Temp src Pulse Resp SpO2 Height Weight  11/10/19 1900 112/71 98.3 F (36.8 C) Oral 86 20 100 % -- --  11/10/19 1658 119/75 98.2 F (36.8 C) -- 86 18 -- _0  (1.727 m) 76.2 kg  11/10/19 1657 -- -- -- -- -- 100 % -- --   Results for orders placed or performed during the hospital encounter of 11/10/19 (from the past 24 hour(s))  CBC     Status: None   Collection Time: 11/10/19  5:29 PM  Result Value Ref Range   WBC 9.9 4.0 - 10.5 K/uL   RBC 4.68 3.87 - 5.11 MIL/uL   Hemoglobin 12.4 12.0 - 15.0 g/dL   HCT 38.5 36 - 46 %   MCV 82.3 80.0 - 100.0 fL   MCH 26.5 26.0 - 34.0 pg   MCHC 32.2 30.0 - 36.0 g/dL   RDW 13.1 11.5 - 15.5 %   Platelets 224 150 - 400 K/uL   nRBC 0.0 0.0 - 0.2 %  Comprehensive metabolic panel     Status: Abnormal   Collection Time: 11/10/19  5:29 PM  Result Value Ref Range   Sodium 132 (L) 135 - 145 mmol/L   Potassium 3.9 3.5 - 5.1 mmol/L   Chloride 101 98 - 111 mmol/L   CO2 21 (L) 22 - 32 mmol/L   Glucose, Bld 210 (H) 70 - 99 mg/dL   BUN 9 6 - 20 mg/dL   Creatinine, Ser 0.56 0.44 - 1.00 mg/dL   Calcium 9.3 8.9 - 10.3 mg/dL   Total Protein 7.1 6.5 - 8.1 g/dL   Albumin 3.5 3.5 - 5.0 g/dL   AST 15 15 - 41 U/L   ALT 14 0 - 44 U/L   Alkaline Phosphatase 52 38 - 126 U/L   Total Bilirubin 0.3 0.3 - 1.2 mg/dL   GFR calc non Af Amer >60 >60 mL/min   GFR calc Af Amer >60 >60 mL/min   Anion gap 10 5 - 15  ABO/Rh     Status: None   Collection Time: 11/10/19  5:29 PM  Result Value Ref Range   ABO/RH(D)      O POS Performed at [facility], [address], [place], [place] [address]   Wet prep, genital     Status:  Abnormal   Collection Time: 11/10/19  5:41 PM   Specimen: PATH Cytology Cervicovaginal Ancillary Only  Result Value Ref Range   Yeast Wet Prep HPF POC PRESENT (A) NONE SEEN   Trich, Wet Prep NONE SEEN NONE SEEN   Clue Cells Wet Prep HPF POC NONE SEEN NONE SEEN   WBC, Wet Prep HPF POC MANY (A) NONE SEEN   Sperm NONE SEEN    Meds ordered this encounter  Medications  . terconazole (TERAZOL 7) 0.4 % vaginal cream    Sig: Place 1 applicator vaginally at bedtime. Use for seven days    Dispense:  45 g    Refill:  0    Order Specific Question:   Supervising Provider    Answer:   Sloan Leiter [[id number]]  . Prenatal Vit-Fe Fumarate-FA (PREPLUS) 27-1 MG TABS    Sig: Take 1 tablet by mouth daily.    Dispense:  30 tablet    Refill:  13    Order Specific Question:   Supervising Provider    Answer:   Sloan Leiter [[id number]]   Assessment and Plan  --29 y.o. [phone] with confirmed IUP at 15w 4d c/w LMP --No acute findings on MFM Limited exam or physical exam --Vulvovaginal Candidiasis, rx to pharmacy --Discharge home in stable condition  F/U: --Patient plans to establish care with [place], CNM 11/10/2019, 7:20 PM

## 2019-11-11 LAB — GC/CHLAMYDIA PROBE AMP (~~LOC~~) NOT AT ARMC
Chlamydia: NEGATIVE
Comment: NEGATIVE
Comment: NORMAL
Neisseria Gonorrhea: NEGATIVE

## 2019-11-11 LAB — ABO/RH: ABO/RH(D): O POS

## 2020-05-31 ENCOUNTER — Other Ambulatory Visit: Payer: Self-pay

## 2020-05-31 ENCOUNTER — Ambulatory Visit (HOSPITAL_COMMUNITY)
Admission: EM | Admit: 2020-05-31 | Discharge: 2020-05-31 | Disposition: A | Discharge status: Home / Self Care | Payer: Medicaid Other | Attending: Emergency Medicine | Admitting: Emergency Medicine

## 2020-05-31 ENCOUNTER — Encounter (HOSPITAL_COMMUNITY): Payer: Self-pay | Admitting: Emergency Medicine

## 2020-05-31 DIAGNOSIS — H60501 Unspecified acute noninfective otitis externa, right ear: Secondary | ICD-10-CM

## 2020-05-31 MED ORDER — NEOMYCIN-POLYMYXIN-HC 3.5-10000-1 OT SUSP: 4.0000 [drp] | Freq: Three times a day (TID) | OTIC | 0 refills | Status: DC

## 2020-05-31 NOTE — ED Triage Notes (Signed)
Pt states that she is having right ear pain. Pt states that her ear is tender to touch, right side facial pain, and neck pain. Pt states that the ear pain started a week ago

## 2020-05-31 NOTE — ED Provider Notes (Signed)
[place]    CSN: [id number] Arrival date & time: 05/31/20  1625      History   Chief Complaint Chief Complaint  Patient presents with  . Otalgia    HPI Kelsey Jones is a 30 y.o. female.   Patient presents with right ear pain x1.5 weeks.  The pain is worse with movement of her ear and radiates to the right side of her face and neck.  She denies fever, chills, sore throat, cough, shortness of breath, or other symptoms.  Treatment attempted at home with Tylenol.  Her medical history includes gestational diabetes.  The history is provided by the patient and medical records.    Past Medical History:  Diagnosis Date  . Gestational diabetes    metformin  . Medical history non-contributory   . Short cervix     Patient Active Problem List   Diagnosis Date Noted  . Gestational diabetes 08/03/2016  . Medication controlled gestational diabetes mellitus in third trimester 05/16/2016  . Short cervix affecting pregnancy 04/04/2016  . Low vitamin D level 01/14/2016  . Supervision of high risk pregnancy, antepartum 01/09/2016  . Prior pregnancy complicated by short cervix, antepartum 01/09/2016  . ASCUS pap smear but negative high-risk HPV on 10/16/2015.    10/23/2015    Past Surgical History:  Procedure Laterality Date  . INDUCED ABORTION      OB History    Gravida  4   Para  2   Term  2   Preterm  0   AB  1   Living  2     SAB  0   IAB  1   Ectopic  0   Multiple  0   Live Births  2            Home Medications    Prior to Admission medications   Medication Sig Start Date End Date Taking? Authorizing Provider  neomycin-polymyxin-hydrocortisone (CORTISPORIN) 3.5-10000-1 OTIC suspension Place 4 drops into the right ear 3 (three) times daily. 05/31/20  Yes Sharion Balloon, NP  acetaminophen (TYLENOL) 500 MG tablet Take 500 mg by mouth every 6 (six) hours as needed for headache (cramps).    [provider]  blood glucose meter kit  and supplies KIT Dispense based on patient and insurance preference. Use up to four times daily as directed. (FOR ICD-9 250.00, 250.01). 03/28/19   Volanda Napoleon, PA-C  metFORMIN (GLUCOPHAGE) 1000 MG tablet Take 1 tablet (1,000 mg total) by mouth 2 (two) times daily. 10/18/18   Antonietta Breach, PA-C  Prenatal Vit-Fe Fumarate-FA (PREPLUS) 27-1 MG TABS Take 1 tablet by mouth daily. 11/10/19   Darlina Rumpf, CNM  terconazole (TERAZOL 7) 0.4 % vaginal cream Place 1 applicator vaginally at bedtime. Use for seven days 11/10/19   Darlina Rumpf, CNM    Family History Family History  Problem Relation Age of Onset  . Diabetes Mother   . Cancer Maternal Grandmother   . Diabetes Maternal Grandmother   . Cancer Maternal Grandfather     Social History Social History   Tobacco Use  . Smoking status: Former Smoker    Types: Cigars  . Smokeless tobacco: Never Used  Vaping Use  . Vaping Use: Never used  Substance Use Topics  . Alcohol use: No    Comment: Occasionally  . Drug use: No     Allergies   Patient has no known allergies.   Review of Systems Review of Systems  Constitutional:  Negative for chills and fever.  HENT: Positive for ear discharge. Negative for ear pain and sore throat.   Eyes: Negative for pain and visual disturbance.  Respiratory: Negative for cough and shortness of breath.   Cardiovascular: Negative for chest pain and palpitations.  Gastrointestinal: Negative for abdominal pain, diarrhea and vomiting.  Genitourinary: Negative for dysuria and hematuria.  Musculoskeletal: Negative for arthralgias and back pain.  Skin: Negative for color change and rash.  Neurological: Negative for seizures and syncope.  All other systems reviewed and are negative.    Physical Exam Triage Vital Signs ED Triage Vitals  Enc Vitals Group     BP      Pulse      Resp      Temp      Temp src      SpO2      Weight      Height      Head Circumference      Peak Flow       Pain Score      Pain Loc      Pain Edu?      Excl. in [place]?    No data found.  Updated Vital Signs BP 115/84 (BP Location: Right Arm)   Pulse 77   Temp 98.5 F (36.9 C) (Oral)   Resp 18   LMP 07/11/2019   SpO2 98%   Breastfeeding No   Visual Acuity Right Eye Distance:   Left Eye Distance:   Bilateral Distance:    Right Eye Near:   Left Eye Near:    Bilateral Near:     Physical Exam Vitals and nursing note reviewed.  Constitutional:      General: She is not in acute distress.    Appearance: She is well-developed.  HENT:     Head: Normocephalic and atraumatic.     Right Ear: Drainage and tenderness present.     Left Ear: Tympanic membrane and ear canal normal.     Ears:     Comments: Purulent drainage in right ear canal.  Unable to visualize TM.    Nose: Nose normal.     Mouth/Throat:     Mouth: Mucous membranes are moist.     Pharynx: Oropharynx is clear.  Eyes:     Conjunctiva/sclera: Conjunctivae normal.  Cardiovascular:     Rate and Rhythm: Normal rate and regular rhythm.     Heart sounds: Normal heart sounds.  Pulmonary:     Effort: Pulmonary effort is normal. No respiratory distress.     Breath sounds: Normal breath sounds.  Abdominal:     Palpations: Abdomen is soft.     Tenderness: There is no abdominal tenderness.  Musculoskeletal:     Cervical back: Neck supple.  Skin:    General: Skin is warm and dry.  Neurological:     General: No focal deficit present.     Mental Status: She is alert and oriented to person, place, and time.     Gait: Gait normal.  Psychiatric:        Mood and Affect: Mood normal.        Behavior: Behavior normal.      UC Treatments / Results  Labs (all labs ordered are listed, but only abnormal results are displayed) Labs Reviewed - No data to display  EKG   Radiology No results found.  Procedures Procedures (including critical care time)  Medications Ordered in UC Medications - No data to display  Initial  Impression / Assessment and Plan / UC Course  I have reviewed the triage vital signs and the nursing notes.  Pertinent labs & imaging results that were available during my care of the patient were reviewed by me and considered in my medical decision making (see chart for details).   Acute otitis externa of right ear.  Treating with Cortisporin eardrops.  Tylenol as needed for discomfort.  Instructed patient to follow-up with her PCP if her symptoms are not improving.  She agrees to plan of care.   Final Clinical Impressions(s) / UC Diagnoses   Final diagnoses:  Acute otitis externa of right ear, unspecified type     Discharge Instructions     Use the ear drops as directed.  Take Tylenol as needed for discomfort.  Follow-up with your primary care provider if your symptoms are not improving.        ED Prescriptions    Medication Sig Dispense Auth. Provider   neomycin-polymyxin-hydrocortisone (CORTISPORIN) 3.5-10000-1 OTIC suspension Place 4 drops into the right ear 3 (three) times daily. 10 mL Sharion Balloon, NP     PDMP not reviewed this encounter.   Sharion Balloon, NP 05/31/20 [phone]

## 2020-05-31 NOTE — Discharge Instructions (Signed)
Use the ear drops as directed.  Take Tylenol as needed for discomfort.  Follow-up with your primary care provider if your symptoms are not improving.

## 2020-09-07 ENCOUNTER — Other Ambulatory Visit: Payer: Self-pay

## 2020-09-07 ENCOUNTER — Ambulatory Visit (HOSPITAL_COMMUNITY)
Admission: EM | Admit: 2020-09-07 | Discharge: 2020-09-07 | Disposition: A | Discharge status: Home / Self Care | Payer: Medicaid Other | Attending: Emergency Medicine | Admitting: Emergency Medicine

## 2020-09-07 ENCOUNTER — Encounter (HOSPITAL_COMMUNITY): Payer: Self-pay

## 2020-09-07 DIAGNOSIS — R197 Diarrhea, unspecified: Secondary | ICD-10-CM

## 2020-09-07 DIAGNOSIS — R112 Nausea with vomiting, unspecified: Secondary | ICD-10-CM

## 2020-09-07 LAB — CBG MONITORING, ED: Glucose-Capillary: 345 mg/dL — ABNORMAL HIGH (ref 70–99)

## 2020-09-07 MED ORDER — ONDANSETRON 4 MG PO TBDP: 4.0000 mg | ORAL_TABLET | Freq: Three times a day (TID) | ORAL | 0 refills | Status: DC | PRN

## 2020-09-07 NOTE — Discharge Instructions (Addendum)
Try a BRAT (bananas, rice, applesause, toast) or bland food diet for the next few days.  Stick with foods that are gentle on your stomach.  Avoid foods that are difficult to digest, such as diary.  As you feel better, you can start eating like you do normally.    You can use ginger (ginger ale, ginger candy) and mint for nausea.    Take the zofran every 8 hours as needed for nausea and vomiting.  Make sure you are checking your blood sugar and taking your medications.  If your blood sugar becomes very elevated, go to the ED for further evaluation.    Make sure you are drinking plenty of fluids, such as water, powerade/gatorade, pedialyte, juices, and teas.    Return or go to the Emergency Department if symptoms worsen or do not improve in the next few days.

## 2020-09-07 NOTE — ED Triage Notes (Signed)
Pt c/o vomiting, nausea, diarrhea X this morning. Pt states she has stomach pain.   Pt states she had chinese food last night.

## 2020-09-07 NOTE — ED Provider Notes (Signed)
[place]    CSN: [id number] Arrival date & time: 09/07/20  1630      History   Chief Complaint Chief Complaint  Patient presents with   Nausea   Vomiting   Diarrhea    HPI Kelsey Jones is a 30 y.o. female.   Patient here for evaluation of nausea, vomiting, and diarrhea that started at approximately 2 AM this morning.  Reports eating some [place] food last night but also states several coworkers have recently had a stomach virus.  Last episode of diarrhea was approximately 1 hour ago and last episode of vomiting was earlier today.  Reports drinking water but has a decreased appetite.  Denies any trauma, injury, or other precipitating event.  Denies any specific alleviating or aggravating factors.  Denies any fevers, chest pain, shortness of breath, numbness, tingling, weakness, or headaches.    The history is provided by the patient.  Diarrhea Associated symptoms: abdominal pain and vomiting    Past Medical History:  Diagnosis Date   Gestational diabetes    metformin   Medical history non-contributory    Short cervix     Patient Active Problem List   Diagnosis Date Noted   Gestational diabetes 08/03/2016   Medication controlled gestational diabetes mellitus in third trimester 05/16/2016   Short cervix affecting pregnancy 04/04/2016   Low vitamin D level 01/14/2016   Supervision of high risk pregnancy, antepartum 01/09/2016   Prior pregnancy complicated by short cervix, antepartum 01/09/2016   ASCUS pap smear but negative high-risk HPV on 10/16/2015.    10/23/2015    Past Surgical History:  Procedure Laterality Date   INDUCED ABORTION      OB History     Gravida  4   Para  2   Term  2   Preterm  0   AB  1   Living  2      SAB  0   IAB  1   Ectopic  0   Multiple  0   Live Births  2            Home Medications    Prior to Admission medications   Medication Sig Start Date End Date Taking? Authorizing Provider   ondansetron (ZOFRAN ODT) 4 MG disintegrating tablet Take 1 tablet (4 mg total) by mouth every 8 (eight) hours as needed for nausea or vomiting. 09/07/20  Yes Pearson Forster, NP  acetaminophen (TYLENOL) 500 MG tablet Take 500 mg by mouth every 6 (six) hours as needed for headache (cramps).    [provider]  blood glucose meter kit and supplies KIT Dispense based on patient and insurance preference. Use up to four times daily as directed. (FOR ICD-9 250.00, 250.01). 03/28/19   Volanda Napoleon, PA-C  metFORMIN (GLUCOPHAGE) 1000 MG tablet Take 1 tablet (1,000 mg total) by mouth 2 (two) times daily. 10/18/18   Antonietta Breach, PA-C  neomycin-polymyxin-hydrocortisone (CORTISPORIN) 3.5-10000-1 OTIC suspension Place 4 drops into the right ear 3 (three) times daily. 05/31/20   Sharion Balloon, NP  Prenatal Vit-Fe Fumarate-FA (PREPLUS) 27-1 MG TABS Take 1 tablet by mouth daily. 11/10/19   Darlina Rumpf, CNM  terconazole (TERAZOL 7) 0.4 % vaginal cream Place 1 applicator vaginally at bedtime. Use for seven days 11/10/19   Darlina Rumpf, CNM    Family History Family History  Problem Relation Age of Onset   Diabetes Mother    Cancer Maternal Grandmother    Diabetes Maternal Grandmother  Cancer Maternal Grandfather     Social History Social History   Tobacco Use   Smoking status: Former    Pack years: 0.00    Types: Cigars   Smokeless tobacco: Never  Vaping Use   Vaping Use: Never used  Substance Use Topics   Alcohol use: No    Comment: Occasionally   Drug use: No     Allergies   Patient has no known allergies.   Review of Systems Review of Systems  Gastrointestinal:  Positive for abdominal pain, diarrhea, nausea and vomiting.  All other systems reviewed and are negative.   Physical Exam Triage Vital Signs ED Triage Vitals  Enc Vitals Group     BP 09/07/20 1657 114/70     Pulse Rate 09/07/20 1656 81     Resp 09/07/20 1656 18     Temp 09/07/20 1656 99.4 F  (37.4 C)     Temp Source 09/07/20 1656 Oral     SpO2 09/07/20 1656 100 %     Weight --      Height --      Head Circumference --      Peak Flow --      Pain Score 09/07/20 1654 6     Pain Loc --      Pain Edu? --      Excl. in [place]? --    No data found.  Updated Vital Signs BP 114/70   Pulse 81   Temp 99.4 F (37.4 C) (Oral)   Resp 18   LMP 08/24/2020 (Exact Date)   SpO2 100%   Visual Acuity Right Eye Distance:   Left Eye Distance:   Bilateral Distance:    Right Eye Near:   Left Eye Near:    Bilateral Near:     Physical Exam Vitals and nursing note reviewed.  Constitutional:      General: She is not in acute distress.    Appearance: Normal appearance. She is not ill-appearing, toxic-appearing or diaphoretic.  HENT:     Head: Normocephalic and atraumatic.  Eyes:     Conjunctiva/sclera: Conjunctivae normal.  Cardiovascular:     Rate and Rhythm: Normal rate.     Pulses: Normal pulses.  Pulmonary:     Effort: Pulmonary effort is normal.  Abdominal:     General: Abdomen is flat.     Palpations: Abdomen is soft.  Musculoskeletal:        General: Normal range of motion.     Cervical back: Normal range of motion.  Skin:    General: Skin is warm and dry.  Neurological:     General: No focal deficit present.     Mental Status: She is alert and oriented to person, place, and time.  Psychiatric:        Mood and Affect: Mood normal.     UC Treatments / Results  Labs (all labs ordered are listed, but only abnormal results are displayed) Labs Reviewed  CBG MONITORING, ED - Abnormal; Notable for the following components:      Result Value   Glucose-Capillary 345 (*)    All other components within normal limits    EKG   Radiology No results found.  Procedures Procedures (including critical care time)  Medications Ordered in UC Medications - No data to display  Initial Impression / Assessment and Plan / UC Course  I have reviewed the triage vital  signs and the nursing notes.  Pertinent labs & imaging results that were available  during my care of the patient were reviewed by me and considered in my medical decision making (see chart for details).    Assessment negative for red flags or concerns.  CBG 345 in office but patient did not take her metformin this morning and had recently had carbs.  Nausea, vomiting, diarrhea possibly due to viral gastroenteritis versus food poisoning.  Recommend a BRAT or bland food diet and then advance as tolerated.  Zofran every 8 hours as needed for nausea or vomiting.  May use ginger and mint for nausea.  Encourage fluids and rest.  Instructed patient to check blood sugars and take medications as prescribed.  If symptoms have not improved or blood sugars remain elevated recommend going to the emergency room for further evaluation. Final Clinical Impressions(s) / UC Diagnoses   Final diagnoses:  Nausea vomiting and diarrhea     Discharge Instructions      Try a BRAT (bananas, rice, applesause, toast) or bland food diet for the next few days.  Stick with foods that are gentle on your stomach.  Avoid foods that are difficult to digest, such as diary.  As you feel better, you can start eating like you do normally.    You can use ginger (ginger ale, ginger candy) and mint for nausea.    Take the zofran every 8 hours as needed for nausea and vomiting.  Make sure you are checking your blood sugar and taking your medications.  If your blood sugar becomes very elevated, go to the ED for further evaluation.    Make sure you are drinking plenty of fluids, such as water, powerade/gatorade, pedialyte, juices, and teas.    Return or go to the Emergency Department if symptoms worsen or do not improve in the next few days.      ED Prescriptions     Medication Sig Dispense Auth. Provider   ondansetron (ZOFRAN ODT) 4 MG disintegrating tablet Take 1 tablet (4 mg total) by mouth every 8 (eight) hours as needed  for nausea or vomiting. 20 tablet Pearson Forster, NP      PDMP not reviewed this encounter.   Pearson Forster, NP 09/07/20 [phone]

## 2020-11-20 ENCOUNTER — Emergency Department (HOSPITAL_COMMUNITY): Payer: Worker's Compensation

## 2020-11-20 ENCOUNTER — Emergency Department (HOSPITAL_COMMUNITY)
Admission: EM | Admit: 2020-11-20 | Discharge: 2020-11-20 | Disposition: A | Discharge status: Home / Self Care | Payer: Worker's Compensation | Attending: Emergency Medicine | Admitting: Emergency Medicine

## 2020-11-20 ENCOUNTER — Other Ambulatory Visit: Payer: Self-pay

## 2020-11-20 DIAGNOSIS — Z87891 Personal history of nicotine dependence: Secondary | ICD-10-CM | POA: Insufficient documentation

## 2020-11-20 DIAGNOSIS — Y9301 Activity, walking, marching and hiking: Secondary | ICD-10-CM | POA: Diagnosis not present

## 2020-11-20 DIAGNOSIS — Y990 Civilian activity done for income or pay: Secondary | ICD-10-CM | POA: Insufficient documentation

## 2020-11-20 DIAGNOSIS — M25561 Pain in right knee: Secondary | ICD-10-CM | POA: Insufficient documentation

## 2020-11-20 DIAGNOSIS — X501XXA Overexertion from prolonged static or awkward postures, initial encounter: Secondary | ICD-10-CM | POA: Diagnosis not present

## 2020-11-20 MED ORDER — IBUPROFEN 800 MG PO TABS
800.0000 mg | ORAL_TABLET | Freq: Once | ORAL | Status: AC
  Administered 2020-11-20: 800 mg via ORAL
  Filled 2020-11-20: qty 2

## 2020-11-20 NOTE — ED Notes (Signed)
Pt discharged and wheeled out of triage in a wheel chair.

## 2020-11-20 NOTE — Discharge Instructions (Addendum)
Your x-ray shows no evidence of fracture or dislocation.  Use knee immobilizer and crutches, for pain treat with ibuprofen 600 mg and Tylenol 1000 mg every 6 hours as needed.  Ice and elevate the knee.  If knee pain is not improving over the next few days call to schedule follow-up appointment with orthopedics.

## 2020-11-20 NOTE — ED Triage Notes (Signed)
Pt c/o right knee pain since 1900. Pt does not know how she injured her knee but her it pop when she was walking.

## 2020-11-20 NOTE — ED Provider Notes (Signed)
[facility] EMERGENCY DEPARTMENT Provider Note   CSN: [id number] Arrival date & time: 11/20/20  2002     History Chief Complaint  Patient presents with   Knee Pain    Kelsey Jones is a 30 y.o. female.  Kelsey Jones is a 30 y.o. female who is otherwise healthy, presents to the emergency department for evaluation of right knee pain.  Patient reports earlier today she was at work and turned while lifting something and felt and heard a pop in her right knee.  Since then she has had pain primarily over the medial aspect of the knee and has had difficulty bearing weight on her right leg.  She reports that 3 years ago she had a patellar dislocation and ever since then she has had issues with her knee but has never seen an orthopedist.  No numbness or weakness.  She did not note any patellar dislocation or deformity to her knee when this happened.  She did have on an old knee brace that was given to her urgent care but this has not helped with her pain.  The history is provided by the patient.      Past Medical History:  Diagnosis Date   Gestational diabetes    metformin   Medical history non-contributory    Short cervix     Patient Active Problem List   Diagnosis Date Noted   Gestational diabetes 08/03/2016   Medication controlled gestational diabetes mellitus in third trimester 05/16/2016   Short cervix affecting pregnancy 04/04/2016   Low vitamin D level 01/14/2016   Supervision of high risk pregnancy, antepartum 01/09/2016   Prior pregnancy complicated by short cervix, antepartum 01/09/2016   ASCUS pap smear but negative high-risk HPV on 10/16/2015.    10/23/2015    Past Surgical History:  Procedure Laterality Date   INDUCED ABORTION       OB History     Gravida  4   Para  2   Term  2   Preterm  0   AB  1   Living  2      SAB  0   IAB  1   Ectopic  0   Multiple  0   Live Births  2           Family History  Problem  Relation Age of Onset   Diabetes Mother    Cancer Maternal Grandmother    Diabetes Maternal Grandmother    Cancer Maternal Grandfather     Social History   Tobacco Use   Smoking status: Former    Types: Cigars   Smokeless tobacco: Never  Vaping Use   Vaping Use: Never used  Substance Use Topics   Alcohol use: No    Comment: Occasionally   Drug use: No    Home Medications Prior to Admission medications   Medication Sig Start Date End Date Taking? Authorizing Provider  acetaminophen (TYLENOL) 500 MG tablet Take 500 mg by mouth every 6 (six) hours as needed for headache (cramps).    [provider]  blood glucose meter kit and supplies KIT Dispense based on patient and insurance preference. Use up to four times daily as directed. (FOR ICD-9 250.00, 250.01). 03/28/19   Volanda Napoleon, PA-C  metFORMIN (GLUCOPHAGE) 1000 MG tablet Take 1 tablet (1,000 mg total) by mouth 2 (two) times daily. 10/18/18   Antonietta Breach, PA-C  neomycin-polymyxin-hydrocortisone (CORTISPORIN) 3.5-10000-1 OTIC suspension Place 4 drops into the right ear  3 (three) times daily. 05/31/20   Sharion Balloon, NP  ondansetron (ZOFRAN ODT) 4 MG disintegrating tablet Take 1 tablet (4 mg total) by mouth every 8 (eight) hours as needed for nausea or vomiting. 09/07/20   Pearson Forster, NP  Prenatal Vit-Fe Fumarate-FA (PREPLUS) 27-1 MG TABS Take 1 tablet by mouth daily. 11/10/19   Darlina Rumpf, CNM  terconazole (TERAZOL 7) 0.4 % vaginal cream Place 1 applicator vaginally at bedtime. Use for seven days 11/10/19   Darlina Rumpf, CNM    Allergies    Patient has no known allergies.  Review of Systems   Review of Systems  Constitutional:  Negative for chills and fever.  Musculoskeletal:  Positive for arthralgias.  Skin:  Negative for color change and wound.  Neurological:  Negative for weakness and numbness.  All other systems reviewed and are negative.  Physical Exam Updated Vital Signs BP (!) 123/91  (BP Location: Right Arm)   Pulse 74   Temp 99.1 F (37.3 C) (Oral)   Resp 16   Ht '5\' 8"'  (1.727 m)   Wt 76 kg   LMP 11/19/2020   SpO2 100%   BMI 25.48 kg/m   Physical Exam Vitals and nursing note reviewed.  Constitutional:      General: She is not in acute distress.    Appearance: Normal appearance. She is well-developed. She is not ill-appearing or diaphoretic.  HENT:     Head: Normocephalic and atraumatic.  Eyes:     General:        Right eye: No discharge.        Left eye: No discharge.  Pulmonary:     Effort: Pulmonary effort is normal. No respiratory distress.  Musculoskeletal:     Comments: Tenderness primarily over the medial aspect of the right knee without significant swelling or deformity, range of motion limited due to pain.  Distal pulses 2+, normal sensation and strength  Neurological:     Mental Status: She is alert and oriented to person, place, and time.     Coordination: Coordination normal.  Psychiatric:        Mood and Affect: Mood normal.        Behavior: Behavior normal.    ED Results / Procedures / Treatments   Labs (all labs ordered are listed, but only abnormal results are displayed) Labs Reviewed - No data to display  EKG None  Radiology DG Knee Complete 4 Views Right  Result Date: 11/20/2020 CLINICAL DATA:  Right knee pain. Patient reports her knee popped out. EXAM: RIGHT KNEE - COMPLETE 4+ VIEW COMPARISON:  None. FINDINGS: No evidence of fracture, dislocation, or joint effusion. The patella appears normally located on provided views. No evidence of arthropathy or other focal bone abnormality. Soft tissues are unremarkable. IMPRESSION: Negative radiographs of the right knee. Electronically Signed   By: Keith Rake M.D.   On: 11/20/2020 22:08    Procedures Procedures   Medications Ordered in ED Medications  ibuprofen (ADVIL) tablet 800 mg (800 mg Oral Given 11/20/20 2111)    ED Course  I have reviewed the triage vital signs and the  nursing notes.  Pertinent labs & imaging results that were available during my care of the patient were reviewed by me and considered in my medical decision making (see chart for details).    MDM Rules/Calculators/A&P  Patient presents with right knee pain, felt a pop when she turned at work today.  Right lower extremity is neurovascularly intact and there is no obvious deformity to the knee.  X-rays are unremarkable without evidence of fracture or dislocation.  Patient may have some internal derangement, potential meniscal or MCL injury.  Will place in knee immobilizer and referred to orthopedics.  Discussed RICE therapy and NSAIDs for symptom management.  Return precautions provided.  Discharged home in good condition.  Final Clinical Impression(s) / ED Diagnoses Final diagnoses:  Acute pain of right knee    Rx / DC Orders ED Discharge Orders     None        Janet Berlin 11/20/20 2339    Quintella Reichert, MD 11/21/20 [phone]

## 2020-11-20 NOTE — ED Provider Notes (Signed)
Emergency Medicine Provider Triage Evaluation Note  Kelsey Jones , a 29 y.o. female  was evaluated in triage.  Pt complains of right knee pain.  Reports that she was turning around when she felt and heard a "pop," to her right knee.  Patient reports that she has had pain since then.  Injury occurred approximately 1900.  Patient has had Tylenol and ice with minimal relief of symptoms.  Review of Systems  Positive: Arthralgia Negative: Numbness, weakness  Physical Exam  BP (!) 123/91 (BP Location: Right Arm)   Pulse 74   Temp 99.1 F (37.3 C) (Oral)   Resp 16   Ht 5\' 8"  (1.727 m)   Wt 76 kg   SpO2 100%   BMI 25.48 kg/m  Gen:   Awake, no distress   Resp:  Normal effort  MSK:   Moves extremities without difficulty, patient has tenderness to medial joint line, decreased range of motion secondary to complaints of pain.  PMS intact distally. Other:  +2 right DP pulse  Medical Decision Making  Medically screening exam initiated at 9:08 PM.  Appropriate orders placed.  Kelsey Jones was informed that the remainder of the evaluation will be completed by another provider, this initial triage assessment does not replace that evaluation, and the importance of remaining in the ED until their evaluation is complete.  The patient appears stable so that the remainder of the work up may be completed by another provider.      Cristie Hem 11/20/20 2110    2111, MD 11/21/20 0003

## 2021-04-04 ENCOUNTER — Ambulatory Visit: Payer: Medicaid Other

## 2021-04-22 ENCOUNTER — Inpatient Hospital Stay (HOSPITAL_COMMUNITY)
Admission: AD | Admit: 2021-04-22 | Discharge: 2021-04-23 | Disposition: A | Discharge status: Home / Self Care | Payer: Medicaid Other | Attending: Obstetrics & Gynecology | Admitting: Obstetrics & Gynecology

## 2021-04-22 DIAGNOSIS — Z794 Long term (current) use of insulin: Secondary | ICD-10-CM | POA: Insufficient documentation

## 2021-04-22 DIAGNOSIS — Z6790 Unspecified blood type, Rh positive: Secondary | ICD-10-CM | POA: Diagnosis not present

## 2021-04-22 DIAGNOSIS — O98811 Other maternal infectious and parasitic diseases complicating pregnancy, first trimester: Secondary | ICD-10-CM | POA: Diagnosis not present

## 2021-04-22 DIAGNOSIS — B3731 Acute candidiasis of vulva and vagina: Secondary | ICD-10-CM | POA: Insufficient documentation

## 2021-04-22 DIAGNOSIS — O24112 Pre-existing diabetes mellitus, type 2, in pregnancy, second trimester: Secondary | ICD-10-CM | POA: Insufficient documentation

## 2021-04-22 DIAGNOSIS — O200 Threatened abortion: Secondary | ICD-10-CM

## 2021-04-22 DIAGNOSIS — Z3A01 Less than 8 weeks gestation of pregnancy: Secondary | ICD-10-CM

## 2021-04-22 DIAGNOSIS — O4690 Antepartum hemorrhage, unspecified, unspecified trimester: Secondary | ICD-10-CM

## 2021-04-22 HISTORY — DX: Type 2 diabetes mellitus without complications: E11.9

## 2021-04-22 LAB — POC URINE PREG, ED: Preg Test, Ur: POSITIVE — AB

## 2021-04-22 NOTE — ED Provider Notes (Signed)
Emergency Medicine Provider OB Triage Evaluation Note  Kelsey Jones is a 31 y.o. female, [id number], at approx [redacted] weeks gestation who presents to the emergency department with complaints of abdominal cramping and vaginal bleeding.  Concerned for possible miscarriage, no hx of same.  Review of  Systems  Positive: vaginal bleeding, cramping Negative: fever  Physical Exam  BP 108/81 (BP Location: Right Arm)    Pulse 95    Temp 99.6 F (37.6 C) (Oral)    Resp 16    SpO2 100%   General: Awake, no distress  HEENT: Atraumatic  Resp: Normal effort  Cardiac: Normal rate Abd: Nondistended, nontender  MSK: Moves all extremities without difficulty Neuro: Speech clear  Medical Decision Making  Pt evaluated for pregnancy concern and is stable for transfer to [facility]U. Pt is in agreement with plan for transfer.  11:41 PM Discussed with MAU APP, Shawna Orleans, who accepts patient in transfer.  Clinical Impression   Vaginal bleeding in pregnancy   Garlon Hatchet, PA-C 04/22/21 2344    Wilkie Aye Mayer Masker, MD 04/23/21 [phone]

## 2021-04-23 ENCOUNTER — Encounter (HOSPITAL_COMMUNITY): Payer: Self-pay | Admitting: *Deleted

## 2021-04-23 ENCOUNTER — Inpatient Hospital Stay (HOSPITAL_COMMUNITY): Payer: Medicaid Other

## 2021-04-23 DIAGNOSIS — Z3A01 Less than 8 weeks gestation of pregnancy: Secondary | ICD-10-CM

## 2021-04-23 DIAGNOSIS — O200 Threatened abortion: Secondary | ICD-10-CM

## 2021-04-23 LAB — BASIC METABOLIC PANEL
Anion gap: 11 (ref 5–15)
BUN: 10 mg/dL (ref 6–20)
CO2: 22 mmol/L (ref 22–32)
Calcium: 9.2 mg/dL (ref 8.9–10.3)
Chloride: 99 mmol/L (ref 98–111)
Creatinine, Ser: 0.54 mg/dL (ref 0.44–1.00)
GFR, Estimated: >60 mL/min (ref >60)
Glucose, Bld: 286 mg/dL — ABNORMAL HIGH (ref 70–99)
Potassium: 3.7 mmol/L (ref 3.5–5.1)
Sodium: 132 mmol/L — ABNORMAL LOW (ref 135–145)

## 2021-04-23 LAB — CBC WITH DIFFERENTIAL/PLATELET
Abs Immature Granulocytes: 0.02 10*3/uL (ref 0.00–0.07)
Basophils Absolute: 0 10*3/uL (ref 0.0–0.1)
Basophils Relative: 0 %
Eosinophils Absolute: 0.1 10*3/uL (ref 0.0–0.5)
Eosinophils Relative: 1 %
HCT: 36.8 % (ref 36.0–46.0)
Hemoglobin: 12.1 g/dL (ref 12.0–15.0)
Immature Granulocytes: 0 %
Lymphocytes Relative: 44 %
Lymphs Abs: 2.8 10*3/uL (ref 0.7–4.0)
MCH: 26.6 pg (ref 26.0–34.0)
MCHC: 32.9 g/dL (ref 30.0–36.0)
MCV: 80.9 fL (ref 80.0–100.0)
Monocytes Absolute: 0.6 10*3/uL (ref 0.1–1.0)
Monocytes Relative: 9 %
Neutro Abs: 2.9 10*3/uL (ref 1.7–7.7)
Neutrophils Relative %: 46 %
Platelets: 223 10*3/uL (ref 150–400)
RBC: 4.55 MIL/uL (ref 3.87–5.11)
RDW: 12.5 % (ref 11.5–15.5)
WBC: 6.5 10*3/uL (ref 4.0–10.5)
nRBC: 0 % (ref 0.0–0.2)

## 2021-04-23 LAB — WET PREP, GENITAL
Clue Cells Wet Prep HPF POC: NONE SEEN
Sperm: NONE SEEN
Trich, Wet Prep: NONE SEEN
WBC, Wet Prep HPF POC: <10 (ref <10)
Yeast Wet Prep HPF POC: NONE SEEN

## 2021-04-23 LAB — GC/CHLAMYDIA PROBE AMP (~~LOC~~) NOT AT ARMC
Chlamydia: NEGATIVE
Comment: NEGATIVE
Comment: NORMAL
Neisseria Gonorrhea: NEGATIVE

## 2021-04-23 LAB — TYPE AND SCREEN
ABO/RH(D): O POS
Antibody Screen: NEGATIVE

## 2021-04-23 LAB — HCG, QUANTITATIVE, PREGNANCY: hCG, Beta Chain, Quant, S: 2485 m[IU]/mL — ABNORMAL HIGH (ref <5)

## 2021-04-23 MED ORDER — TERCONAZOLE 0.4 % VA CREA: 1.0000 | TOPICAL_CREAM | Freq: Every day | VAGINAL | 0 refills | Status: DC

## 2021-04-23 NOTE — MAU Note (Signed)
PT SAYS WAS AT WORK 845PM -FELT D/C AT HOME- BLOOD IN UNDERWEAR  UPT LAST WEEK - POSITIVE  AT 0549- LOWER ABD HURTING  BLEEDING IN TRAIGE - PAD - SMALL AMT CRAMPS- 6 AND SHARP PAIN ON BOTH SIDES

## 2021-04-23 NOTE — MAU Provider Note (Signed)
History     CSN: [id number]  Arrival date and time: 04/22/21 2302   Event Date/Time   First Provider Initiated Contact with Patient 04/23/21 0038      Chief Complaint  Patient presents with   Abdominal Pain   Vaginal Bleeding   31 y.o. [id number] _0  by sure LMP presenting with VB. Reports onset around 8pm tonight. Bleeding was dark red and stained her underwear. Reports LAP this am around 5am and again around 2pm.    OB History     Gravida  5   Para  2   Term  2   Preterm  0   AB  1   Living  2      SAB  0   IAB  1   Ectopic  0   Multiple  0   Live Births  2           Past Medical History:  Diagnosis Date   Gestational diabetes    metformin   Medical history non-contributory    Short cervix    Type 2 diabetes mellitus (HCC)     Past Surgical History:  Procedure Laterality Date   INDUCED ABORTION      Family History  Problem Relation Age of Onset   Diabetes Mother    Cancer Maternal Grandmother    Diabetes Maternal Grandmother    Cancer Maternal Grandfather     Social History   Tobacco Use   Smoking status: Former    Types: Cigars   Smokeless tobacco: Never  Vaping Use   Vaping Use: Never used  Substance Use Topics   Alcohol use: No    Comment: Occasionally   Drug use: No    Allergies: No Known Allergies  No medications prior to admission.    Review of Systems  Gastrointestinal:  Positive for abdominal pain.  Genitourinary:  Positive for vaginal bleeding.  Physical Exam   Blood pressure 104/76, pulse 84, temperature 98.7 F (37.1 C), temperature source Oral, resp. rate 20, height _1  (1.702 m), weight 74 kg, last menstrual period 03/10/2021, SpO2 100 %.  Physical Exam Vitals and nursing note reviewed. Exam conducted with a chaperone present.  Constitutional:      General: She is not in acute distress.    Appearance: Normal appearance.  HENT:     Head: Normocephalic and atraumatic.  Cardiovascular:     Rate and  Rhythm: Normal rate.  Pulmonary:     Effort: Pulmonary effort is normal.  Abdominal:     General: There is no distension.     Palpations: Abdomen is soft. There is no mass.     Tenderness: There is no abdominal tenderness. There is no guarding or rebound.     Hernia: No hernia is present.  Genitourinary:    Comments: External: no lesions or erythema Vagina: rugated, pink, moist, small amt bloody discharge Uterus: non enlarged, anteverted, non tender, no CMT Adnexae: no masses, + tenderness left, no tenderness right Cervix closed/long   Musculoskeletal:        General: Normal range of motion.     Cervical back: Normal range of motion.  Skin:    General: Skin is warm and dry.  Neurological:     General: No focal deficit present.     Mental Status: She is alert and oriented to person, place, and time.  Psychiatric:        Mood and Affect: Mood normal.        Behavior:  Behavior normal.   Results for orders placed or performed during the hospital encounter of 04/22/21 (from the past 24 hour(s))  POC Urine Pregnancy, ED (not at [facility])     Status: Abnormal   Collection Time: 04/22/21 11:26 PM  Result Value Ref Range   Preg Test, Ur POSITIVE (A) NEGATIVE  Type and screen     Status: None   Collection Time: 04/22/21 11:45 PM  Result Value Ref Range   ABO/RH(D) O POS    Antibody Screen NEG    Sample Expiration      04/25/2021,2359 Performed at [facility] [address] [place], [place] [address]   CBC with Differential     Status: None   Collection Time: 04/22/21 11:48 PM  Result Value Ref Range   WBC 6.5 4.0 - 10.5 K/uL   RBC 4.55 3.87 - 5.11 MIL/uL   Hemoglobin 12.1 12.0 - 15.0 g/dL   HCT 36.8 36.0 - 46.0 %   MCV 80.9 80.0 - 100.0 fL   MCH 26.6 26.0 - 34.0 pg   MCHC 32.9 30.0 - 36.0 g/dL   RDW 12.5 11.5 - 15.5 %   Platelets 223 150 - 400 K/uL   nRBC 0.0 0.0 - 0.2 %   Neutrophils Relative % 46 %   Neutro Abs 2.9 1.7 - 7.7 K/uL   Lymphocytes Relative 44 %    Lymphs Abs 2.8 0.7 - 4.0 K/uL   Monocytes Relative 9 %   Monocytes Absolute 0.6 0.1 - 1.0 K/uL   Eosinophils Relative 1 %   Eosinophils Absolute 0.1 0.0 - 0.5 K/uL   Basophils Relative 0 %   Basophils Absolute 0.0 0.0 - 0.1 K/uL   Immature Granulocytes 0 %   Abs Immature Granulocytes 0.02 0.00 - 0.07 K/uL  Wet prep, genital     Status: None   Collection Time: 04/23/21 12:45 AM  Result Value Ref Range   Yeast Wet Prep HPF POC NONE SEEN NONE SEEN   Trich, Wet Prep NONE SEEN NONE SEEN   Clue Cells Wet Prep HPF POC NONE SEEN NONE SEEN   WBC, Wet Prep HPF POC <10 <10   Sperm NONE SEEN    US OB LESS THAN 14 WEEKS WITH OB TRANSVAGINAL  Result Date: 04/23/2021 CLINICAL DATA:  Vaginal bleeding EXAM: OBSTETRIC <14 WK [place] AND TRANSVAGINAL OB US TECHNIQUE: Both transabdominal and transvaginal ultrasound examinations were performed for complete evaluation of the gestation as well as the maternal uterus, adnexal regions, and pelvic cul-de-sac. Transvaginal technique was performed to assess early pregnancy. COMPARISON:  None. FINDINGS: Intrauterine gestational sac: Single Yolk sac:  Visualized. Embryo:  Visualized. Cardiac Activity: Visualized. Heart Rate: 119 bpm MSD:   mm    w     d CRL:  4.4 mm   6 w   1 d                  [place] EDC: 12/16/2021 Subchorionic hemorrhage:  None visualized. Maternal uterus/adnexae: No adnexal mass or free fluid. IMPRESSION: Six week 1 day intrauterine pregnancy. Fetal heart rate 119 beats per minute. No acute maternal findings. Electronically Signed   By: Rolm Baptise M.D.   On: 04/23/2021 01:19    MAU Course  Procedures  MDM Labs and [place] ordered and reviewed. Viable IUP on [place], no [place]. Pt states she has T2DM on Insulin and BS running 200+. She takes Lantus 10U at night ans stopped Metformin d/t GI upset. CareEverywhere reviewed: Last A1C  13.2 in December 22'. Recommend calling PCP tomorrow for Insulin adjustment as uncontrolled DM may increase risk for miscarriage. Will treat  yeast. Stable for discharge home.   Assessment and Plan   1. Threatened miscarriage   2. Vaginal bleeding in pregnancy   3. Blood type, Rh positive   4. [redacted] weeks gestation of pregnancy   5. Yeast vaginitis    Discharge home Follow up with PCP for DM Follow up at [facility] to start prenatal care Start PNV SAB precautions Rx Terazol  Allergies as of 04/23/2021   No Known Allergies      Medication List     STOP taking these medications    ibuprofen 200 MG tablet Commonly known as: ADVIL   metFORMIN 1000 MG tablet Commonly known as: GLUCOPHAGE   neomycin-polymyxin-hydrocortisone 3.5-10000-1 OTIC suspension Commonly known as: CORTISPORIN       TAKE these medications    acetaminophen 500 MG tablet Commonly known as: TYLENOL Take 500 mg by mouth every 6 (six) hours as needed for headache (cramps).   blood glucose meter kit and supplies Kit Dispense based on patient and insurance preference. Use up to four times daily as directed. (FOR ICD-9 250.00, 250.01).   insulin glargine 100 UNIT/ML injection Commonly known as: LANTUS Inject 10 Units into the skin at bedtime.   ondansetron 4 MG disintegrating tablet Commonly known as: Zofran ODT Take 1 tablet (4 mg total) by mouth every 8 (eight) hours as needed for nausea or vomiting.   PrePLUS 27-1 MG Tabs Take 1 tablet by mouth daily.   terconazole 0.4 % vaginal cream Commonly known as: TERAZOL 7 Place 1 applicator vaginally at bedtime. What changed: additional instructions       Julianne Handler, CNM 04/23/2021, 1:50 AM

## 2021-04-25 ENCOUNTER — Encounter (HOSPITAL_COMMUNITY): Payer: Self-pay | Admitting: Emergency Medicine

## 2021-04-25 ENCOUNTER — Other Ambulatory Visit: Payer: Self-pay

## 2021-04-25 ENCOUNTER — Inpatient Hospital Stay (HOSPITAL_COMMUNITY)
Admission: AD | Admit: 2021-04-25 | Discharge: 2021-04-25 | Disposition: A | Discharge status: Home / Self Care | Payer: Medicaid Other | Attending: Obstetrics and Gynecology | Admitting: Obstetrics and Gynecology

## 2021-04-25 ENCOUNTER — Inpatient Hospital Stay (HOSPITAL_COMMUNITY): Payer: Medicaid Other

## 2021-04-25 DIAGNOSIS — O4690 Antepartum hemorrhage, unspecified, unspecified trimester: Secondary | ICD-10-CM

## 2021-04-25 DIAGNOSIS — Z87891 Personal history of nicotine dependence: Secondary | ICD-10-CM | POA: Diagnosis not present

## 2021-04-25 DIAGNOSIS — Z3A01 Less than 8 weeks gestation of pregnancy: Secondary | ICD-10-CM | POA: Insufficient documentation

## 2021-04-25 DIAGNOSIS — O209 Hemorrhage in early pregnancy, unspecified: Secondary | ICD-10-CM

## 2021-04-25 DIAGNOSIS — O039 Complete or unspecified spontaneous abortion without complication: Secondary | ICD-10-CM | POA: Insufficient documentation

## 2021-04-25 LAB — URINALYSIS, ROUTINE W REFLEX MICROSCOPIC
Bacteria, UA: NONE SEEN
Bilirubin Urine: NEGATIVE
Glucose, UA: >=500 mg/dL — AB
Ketones, ur: NEGATIVE mg/dL
Leukocytes,Ua: NEGATIVE
Nitrite: NEGATIVE
Protein, ur: NEGATIVE mg/dL
Specific Gravity, Urine: 1.04 — ABNORMAL HIGH (ref 1.005–1.030)
pH: 5 (ref 5.0–8.0)

## 2021-04-25 NOTE — MAU Note (Signed)
Wynelle Bourgeois CNM in Family Rm to discuss test results and discharge plan. Pt then d/c home by [facility]

## 2021-04-25 NOTE — MAU Provider Note (Signed)
Chief Complaint: Vaginal Bleeding   Event Date/Time   First Provider Initiated Contact with Patient 04/25/21 2137        SUBJECTIVE HPI: Kelsey Jones is a 31 y.o. [id number] at 41w4dby LMP who presents to maternity admissions reporting increased vaginal bleeding today.  Passing clots.  Was seen on 04/23/21 for bleeding and a live single embryo was seen.  Patient is concerned about the status of the baby. She denies dizziness, n/v, or fever/chills.    Vaginal Bleeding The patient's primary symptoms include pelvic pain (cramping) and vaginal bleeding. The patient's pertinent negatives include no genital itching, genital lesions or genital odor. This is a recurrent problem. The current episode started in the past 7 days. The pain is mild. The problem affects both sides. She is pregnant. Pertinent negatives include no chills, fever, nausea or vomiting. The vaginal discharge was bloody. The vaginal bleeding is typical of menses. She has been passing clots. She has not been passing tissue. Nothing aggravates the symptoms. She has tried nothing for the symptoms.   RN note: Having vag bleeding for 4 days. I was here 2 days ago and was told baby was ok by u/s but could not say why I am bleeding. Having some lower abd cramping. I just want to know why I am bleeding and if the baby is ok. Have used 2 large pads today. Have passed 4-5 moderate clots today  Past Medical History:  Diagnosis Date   Gestational diabetes    metformin   Medical history non-contributory    Short cervix    Type 2 diabetes mellitus (HCC)    Past Surgical History:  Procedure Laterality Date   INDUCED ABORTION     Social History   Socioeconomic History   Marital status: Significant Other    Spouse name: Not on file   Number of children: Not on file   Years of education: Not on file   Highest education level: Not on file  Occupational History   Not on file  Tobacco Use   Smoking status: Former    Types: Cigars    Smokeless tobacco: Never  Vaping Use   Vaping Use: Never used  Substance and Sexual Activity   Alcohol use: No    Comment: Occasionally   Drug use: No   Sexual activity: Yes    Birth control/protection: None  Other Topics Concern   Not on file  Social History Narrative   Not on file   Social Determinants of Health   Financial Resource Strain: Not on file  Food Insecurity: Not on file  Transportation Needs: Not on file  Physical Activity: Not on file  Stress: Not on file  Social Connections: Not on file  Intimate Partner Violence: Not on file   No current facility-administered medications on file prior to encounter.   Current Outpatient Medications on File Prior to Encounter  Medication Sig Dispense Refill   acetaminophen (TYLENOL) 500 MG tablet Take 500 mg by mouth every 6 (six) hours as needed for headache (cramps).     blood glucose meter kit and supplies KIT Dispense based on patient and insurance preference. Use up to four times daily as directed. (FOR ICD-9 250.00, 250.01). 1 each 0   insulin glargine (LANTUS) 100 UNIT/ML injection Inject 10 Units into the skin at bedtime.     ondansetron (ZOFRAN ODT) 4 MG disintegrating tablet Take 1 tablet (4 mg total) by mouth every 8 (eight) hours as needed for nausea or vomiting. 2[place]  tablet 0   Prenatal Vit-Fe Fumarate-FA (PREPLUS) 27-1 MG TABS Take 1 tablet by mouth daily. 30 tablet 13   terconazole (TERAZOL 7) 0.4 % vaginal cream Place 1 applicator vaginally at bedtime. 45 g 0   No Known Allergies  I have reviewed patient's Past Medical Hx, Surgical Hx, Family Hx, Social Hx, medications and allergies.   ROS:  Review of Systems  Constitutional:  Negative for chills and fever.  Gastrointestinal:  Negative for nausea and vomiting.  Genitourinary:  Positive for pelvic pain (cramping) and vaginal bleeding.  Review of Systems  Other systems negative   Physical Exam  Physical Exam Patient Vitals for the past 24 hrs:  BP Temp  Temp src Pulse Resp SpO2 Height Weight  04/25/21 2115 113/77 -- -- -- -- -- -- --  04/25/21 2113 -- 98.5 F (36.9 C) -- 72 16 100 % '5\' 7"'  (1.702 m) 73.6 kg  04/25/21 1958 -- -- -- -- -- 100 % '5\' 7"'  (1.702 m) 73.9 kg  04/25/21 1938 102/72 99.2 F (37.3 C) Oral 73 16 100 % -- --   Constitutional: Well-developed, well-nourished female in no acute distress.  Cardiovascular: normal rate Respiratory: normal effort GI: Abd soft, non-tender. Pos BS x 4 MS: Extremities nontender, no edema, normal ROM Neurologic: Alert and oriented x 4.  GU: Neg CVAT.  PELVIC EXAM: Deferred in lieu of ultrasound  LAB RESULTS Results for orders placed or performed during the hospital encounter of 04/25/21 (from the past 24 hour(s))  Urinalysis, Routine w reflex microscopic Urine, Clean Catch     Status: Abnormal   Collection Time: 04/25/21  9:20 PM  Result Value Ref Range   Color, Urine YELLOW YELLOW   APPearance CLEAR CLEAR   Specific Gravity, Urine 1.040 (H) 1.005 - 1.030   pH 5.0 5.0 - 8.0   Glucose, UA >=500 (A) NEGATIVE mg/dL   Hgb urine dipstick LARGE (A) NEGATIVE   Bilirubin Urine NEGATIVE NEGATIVE   Ketones, ur NEGATIVE NEGATIVE mg/dL   Protein, ur NEGATIVE NEGATIVE mg/dL   Nitrite NEGATIVE NEGATIVE   Leukocytes,Ua NEGATIVE NEGATIVE   RBC / HPF 0-5 0 - 5 RBC/hpf   WBC, UA 0-5 0 - 5 WBC/hpf   Bacteria, UA NONE SEEN NONE SEEN   Squamous Epithelial / LPF 0-5 0 - 5   Mucus PRESENT     --/--/O POS (02/06 2345)  IMAGING US OB Transvaginal  Result Date: 04/25/2021 CLINICAL DATA:  Pregnant, bleeding EXAM: TRANSVAGINAL OB ULTRASOUND TECHNIQUE: Transvaginal ultrasound was performed for complete evaluation of the gestation as well as the maternal uterus, adnexal regions, and pelvic cul-de-sac. COMPARISON:  04/23/2021 FINDINGS: Intrauterine gestational sac: None Maternal uterus/adnexae: Focal thickening of the lower uterine segment measuring up to 13 mm, with associated hemorrhage/debris in this  location. Bilateral ovaries are within normal limits. No free fluid. IMPRESSION: IUP no longer visualized. Focal thickening/debris in the lower uterine segment. This appearance is compatible with missed abortion/abortion in progress. Electronically Signed   By: Julian Hy M.D.   On: 04/25/2021 22:14     MAU Management/MDM: Ordered Ultrasound to rule out miscarriage  >> This did show spontaneous abortion,  Fetus seen earlier is no longer seen   Reviewed findings of ultrasound.  Patient grieving appropriately   She wonders it it is because of her diabetes. We discussed we cannot know why this happened for sure, but I do think it is important to followup with her endocrinologist to make sure her diabetes is handled.  This bleeding/pain can represent a normal pregnancy with bleeding, spontaneous abortion   The process as listed above helps to determine which of these is present.    ASSESSMENT 1. Vaginal bleeding in pregnancy   2. Bleeding in early pregnancy   3.    Spontaneous abortion  PLAN Discharge home Message sent to office for followup Bleeding precautions  Pt stable at time of discharge. Encouraged to return here if she develops worsening of symptoms, increase in pain, fever, or other concerning symptoms.    Hansel Feinstein CNM, MSN Certified Nurse-Midwife 04/25/2021  9:37 PM

## 2021-04-25 NOTE — MAU Note (Signed)
Having vag bleeding for 4 days. I was here 2 days ago and was told baby was ok by u/s but could not say why I am bleeding. Having some lower abd cramping. I just want to know why I am bleeding and if the baby is ok. Have used 2 large pads today. Have passed 4-5 moderate clots today

## 2021-04-25 NOTE — ED Triage Notes (Signed)
Pt reported to ED with c/o vaginal bleeding and pain to lower abdomen x2 days. Pt states she was seen at this ED when symptoms initially began, states that ultrasound was performed and fetus declared viable but bleeding has not ceased. G3P2.

## 2021-04-25 NOTE — ED Provider Notes (Signed)
Emergency Medicine Provider OB Triage Evaluation Note  Kelsey Jones is a 31 y.o. female, [id number], at [redacted]w[redacted]d gestation who presents to the emergency department with complaints of continued bleeding. The patient was seen in the MAU on Tuesday with complaints of vaginal bleeding and abdominal cramping. The patient states that she was discharged home but continues to have concerns about bleeding.   Review of  Systems  Positive: Vaginal bleeding, cramping Negative: Fever  Physical Exam  BP 102/72 (BP Location: Right Arm)    Pulse 73    Temp 99.2 F (37.3 C) (Oral)    Resp 16    Ht 5\' 7"  (1.702 m)    Wt 73.9 kg    LMP 03/10/2021    SpO2 100%    BMI 25.53 kg/m  General: Awake, no distress  HEENT: Atraumatic  Resp: Normal effort  Cardiac: Normal rate Abd: Nondistended, nontender  MSK: Moves all extremities without difficulty Neuro: Speech clear  Medical Decision Making  Pt evaluated for pregnancy concern and is stable for transfer to MAU. Pt is in agreement with plan for transfer.  8:33 PM Discussed with MAU APP, who accepts patient in transfer.  Clinical Impression   1. Vaginal bleeding in pregnancy        Dorothyann Peng, [place] 04/25/21 2033    Lucrezia Starch, MD 04/25/21 [phone]

## 2021-04-25 NOTE — ED Provider Notes (Signed)
Pt presented to our [facility], with worsened bleeding. She states she understood from a nurse call that she was to present to an urgent care. Discussed with her that as a matter of policy, we do not see pregnant people here for bleeding or abd pain, but instead send those patients to the MAU, where she went 2/7. She stated understanding.   Zenia Resides, MD 04/25/21 [phone]

## 2021-05-09 ENCOUNTER — Ambulatory Visit: Payer: Medicaid Other | Admitting: Medical

## 2021-08-08 ENCOUNTER — Other Ambulatory Visit: Payer: Self-pay

## 2021-08-08 ENCOUNTER — Emergency Department (HOSPITAL_COMMUNITY)
Admission: EM | Admit: 2021-08-08 | Discharge: 2021-08-08 | Disposition: A | Discharge status: Home / Self Care | Payer: Medicaid Other | Attending: Emergency Medicine | Admitting: Emergency Medicine

## 2021-08-08 ENCOUNTER — Emergency Department (HOSPITAL_COMMUNITY): Payer: Medicaid Other

## 2021-08-08 ENCOUNTER — Encounter (HOSPITAL_COMMUNITY): Payer: Self-pay

## 2021-08-08 DIAGNOSIS — M25561 Pain in right knee: Secondary | ICD-10-CM | POA: Diagnosis present

## 2021-08-08 NOTE — ED Triage Notes (Signed)
Pt arrived POV from home c/o right knee pain and a possible torn mensicus. Pt states she can't bend her knee she heard a pop and sharp pain and this has happened before.

## 2021-08-08 NOTE — ED Provider Triage Note (Signed)
Emergency Medicine Provider Triage Evaluation Note  Kelsey Jones [place] , a 31 y.o. female  was evaluated in triage.  Pt complains of right-sided knee pain.  Patient states she tore her meniscus again.  He states that she was stepping backwards when she felt a popping sensation in her knee.  Unable to bear weight since then.  Review of Systems  Positive:  Negative: See above  Physical Exam  BP 123/84   Pulse (!) 106   Temp 98.9 F (37.2 C) (Oral)   Resp 16   Ht 5\' 7"  (1.702 m)   Wt 74.4 kg   LMP 03/10/2021   SpO2 99%   BMI 25.69 kg/m  Gen:   Awake, no distress   Resp:  Normal effort  MSK:   Limited range of motion in the right knee secondary to pain. Other:    Medical Decision Making  Medically screening exam initiated at 5:45 PM.  Appropriate orders placed.  Kelsey Jones was informed that the remainder of the evaluation will be completed by another provider, this initial triage assessment does not replace that evaluation, and the importance of remaining in the ED until their evaluation is complete.     Cristie Hem [place], [place] 08/08/21 1746

## 2021-08-08 NOTE — Discharge Instructions (Addendum)
You came to the emergency department today to be evaluated for your right knee pain.  The x-ray obtained did not show any broken bones or dislocations.  Please follow-up with the orthopedic provider Dr. Stann Mainland to review seen in the past for your right knee injuries.  Please use the knee immobilizer and remain nonweightbearing with crutches until he can follow-up with the orthopedic provider.  Please take Ibuprofen (Advil, motrin) and Tylenol (acetaminophen) to relieve your pain.    You may take up to 600 MG (3 pills) of normal strength ibuprofen every 8 hours as needed.   You make take tylenol, up to 1,000 mg (two extra strength pills) every 8 hours as needed.   It is safe to take ibuprofen and tylenol at the same time as they work differently.   Do not take more than 3,000 mg tylenol in a 24 hour period (not more than one dose every 8 hours.  Please check all medication labels as many medications such as pain and cold medications may contain tylenol.  Do not drink alcohol while taking these medications.  Do not take other NSAID'S while taking ibuprofen (such as aleve or naproxen).  Please take ibuprofen with food to decrease stomach upset.  Get help right away if: Your knee swells, and the swelling becomes worse. You cannot move your knee. You have severe pain in your knee that cannot be managed with pain medicine.

## 2021-08-08 NOTE — ED Provider Notes (Signed)
[facility]ERGENC[facility] DEPARTMENT Provider Note   CSN: [id number] Arrival date & time: 08/08/21  1712     History  Chief Complaint  Patient presents with   Knee Pain    Kelsey Jones is a 31 y.o. female with past medical history of type 2 diabetes, sprain of right anterior cruciate ligament, tear of medial meniscus of right knee.  Presents to the emergency department for complaint of right knee pain.  Patient states that she was standing on Tuesday when she took a step backwards.  Patient felt her knee move side to side as well as "heard and felt a pop.".  Patient reports that she has had constant pain to right knee since then.  Patient has had increased pain with touch, movement, or weightbearing.  Patient has been taking Tylenol at home with minimal improvement in pain.  Patient denies any numbness, weakness, color change, pallor, wounds, fever, chills.   Knee Pain Associated symptoms: no fever       Home Medications Prior to Admission medications   Medication Sig Start Date End Date Taking? Authorizing Provider  acetaminophen (TYLENOL) 500 MG tablet Take 500 mg by mouth every 6 (six) hours as needed for headache (cramps).    [provider]  blood glucose meter kit and supplies KIT Dispense based on patient and insurance preference. Use up to four times daily as directed. (FOR ICD-9 250.00, 250.01). 03/28/19   Providence Lanius A, PA-C  insulin glargine (LANTUS) 100 UNIT/ML injection Inject 10 Units into the skin at bedtime.    [provider]  ondansetron (ZOFRAN ODT) 4 MG disintegrating tablet Take 1 tablet (4 mg total) by mouth every 8 (eight) hours as needed for nausea or vomiting. 09/07/20   Pearson Forster, NP  Prenatal Vit-Fe Fumarate-FA (PREPLUS) 27-1 MG TABS Take 1 tablet by mouth daily. 11/10/19   Darlina Rumpf, CNM  terconazole (TERAZOL 7) 0.4 % vaginal cream Place 1 applicator vaginally at bedtime. 04/23/21   Julianne Handler,  CNM      Allergies    Patient has no known allergies.    Review of Systems   Review of Systems  Constitutional:  Negative for chills and fever.  Musculoskeletal:  Positive for arthralgias.  Skin:  Negative for color change, pallor, rash and wound.  Neurological:  Negative for weakness and numbness.   Physical Exam Updated Vital Signs BP 108/89 (BP Location: Right Arm)   Pulse (!) 110   Temp 98.9 F (37.2 C) (Oral)   Resp 14   Ht '5\' 7"'  (1.702 m)   Wt 74.4 kg   LMP  (LMP Unknown)   SpO2 98%   Breastfeeding Unknown   BMI 25.69 kg/m  Physical Exam Vitals and nursing note reviewed.  Constitutional:      General: She is not in acute distress.    Appearance: She is not ill-appearing, toxic-appearing or diaphoretic.  HENT:     Head: Normocephalic.  Eyes:     General: No scleral icterus.       Right eye: No discharge.        Left eye: No discharge.  Cardiovascular:     Rate and Rhythm: Normal rate.     Pulses:          Dorsalis pedis pulses are 2+ on the right side.  Pulmonary:     Effort: Pulmonary effort is normal.  Musculoskeletal:     Right knee: No swelling, deformity, effusion, erythema, ecchymosis, lacerations, bony  tenderness or crepitus. Decreased range of motion. Tenderness present. No medial joint line, lateral joint line or patellar tendon tenderness. Normal alignment.     Left knee: No swelling, deformity, effusion, erythema, ecchymosis, lacerations, bony tenderness or crepitus. Normal range of motion. No tenderness. Normal alignment.     Right lower leg: No swelling, deformity, lacerations, tenderness or bony tenderness. No edema.     Left lower leg: No swelling, deformity, lacerations, tenderness or bony tenderness. No edema.     Right ankle: No swelling, deformity, ecchymosis or lacerations. No tenderness. Normal range of motion.     Left ankle: No swelling, deformity, ecchymosis or lacerations. No tenderness. Normal range of motion.     Right foot: Normal  range of motion and normal capillary refill. No swelling, deformity, tenderness, bony tenderness or crepitus. Normal pulse.     Left foot: Normal range of motion and normal capillary refill. No swelling, deformity, tenderness, bony tenderness or crepitus. Normal pulse.     Comments: Decreased range of motion to right knee secondary to complaints of pain.  Patient is able to slightly extend the knee with no defect to quadriceps tendon.  Tenderness to right popliteal area.  Pulse, motor, and sensation intact distally.  Skin:    General: Skin is warm and dry.  Neurological:     General: No focal deficit present.     Mental Status: She is alert.  Psychiatric:        Behavior: Behavior is cooperative.    ED Results / Procedures / Treatments   Labs (all labs ordered are listed, but only abnormal results are displayed) Labs Reviewed - No data to display  EKG None  Radiology DG Knee Complete 4 Views Right  Result Date: 08/08/2021 CLINICAL DATA:  Right knee pain and possible torn meniscus. EXAM: RIGHT KNEE - COMPLETE 4+ VIEW COMPARISON:  Radiograph November 23, 2020 FINDINGS: No evidence of fracture, dislocation, or joint effusion. No evidence of arthropathy or other focal bone abnormality. Soft tissues are unremarkable. IMPRESSION: No acute osseous abnormality. Electronically Signed   By: Dahlia Bailiff M.D.   On: 08/08/2021 18:17    Procedures Procedures    Medications Ordered in ED Medications - No data to display  ED Course/ Medical Decision Making/ A&P                           Medical Decision Making  Alert 31 year old female no acute distress, nontoxic-appearing.  Presents to the ED with a chief complaint of right knee pain.  Information obtained from patient and patient's significant other at bedside.  Past medical records were reviewed including previous provider notes and imaging.  Per chart review patient has previously seen Dr. Stann Mainland [facility] for prain of right  anterior cruciate ligament and tear of medial meniscus of right knee.  X-ray imaging was obtained while patient was in triage.  I personally viewed and interpreted patient's imaging.  X-ray imaging shows no acute osseous abnormality of right knee.  We will place patient in knee immobilizer and make nonweightbearing with crutches.  Patient to follow-up with Dr. Stann Mainland in outpatient setting.  Discussed symptomatic treatment with over-the-counter pain medication, elevation, rest, and ice.  Based on patient's chief complaint, I considered admission might be necessary, however after reassuring ED workup feel patient is reasonable for discharge.  Discussed results, findings, treatment and follow up. Patient advised of return precautions. Patient verbalized understanding and agreed with plan.  Portions of  this note were generated with [employer]. Dictation errors may occur despite best attempts at proofreading.         Final Clinical Impression(s) / ED Diagnoses Final diagnoses:  None    Rx / DC Orders ED Discharge Orders     None         Dyann Ruddle 08/08/21 2147    Sherwood Gambler, MD 08/10/21 [phone]

## 2022-03-26 ENCOUNTER — Other Ambulatory Visit: Payer: Self-pay | Admitting: Certified Nurse Midwife

## 2022-07-10 ENCOUNTER — Other Ambulatory Visit (INDEPENDENT_AMBULATORY_CARE_PROVIDER_SITE_OTHER): Payer: Self-pay | Admitting: Certified Nurse Midwife

## 2022-07-10 ENCOUNTER — Telehealth: Payer: Medicaid Other | Admitting: Physician Assistant

## 2022-07-10 DIAGNOSIS — B3731 Acute candidiasis of vulva and vagina: Secondary | ICD-10-CM

## 2022-07-10 MED ORDER — FLUCONAZOLE 150 MG PO TABS: 150.0000 mg | ORAL_TABLET | Freq: Once | ORAL | 0 refills | Status: AC

## 2022-07-10 NOTE — Progress Notes (Signed)
I have spent 5 minutes in review of e-visit questionnaire, review and updating patient chart, medical decision making and response to patient.   Kingslee Mairena Cody Matheson Vandehei, PA-C    

## 2022-07-10 NOTE — Progress Notes (Signed)

## 2022-07-24 ENCOUNTER — Telehealth: Payer: Medicaid Other | Admitting: Nurse Practitioner

## 2022-07-24 DIAGNOSIS — R350 Frequency of micturition: Secondary | ICD-10-CM

## 2022-07-24 NOTE — Progress Notes (Signed)
Because this symptom alone is not always due to an infection, I feel your condition warrants further evaluation and I recommend that you be seen for a face to face visit.    It would be best to have your urine tested and possibly other labwork to get a definite diagnosis   Please contact your primary care physician practice to be seen. Many offices offer virtual options to be seen via video if you are not comfortable going in person to a medical facility at this time.  NOTE: You will NOT be charged for this eVisit.  If you do not have a PCP, [facility] offers a free physician referral service available at [phone]. Our trained staff has the experience, knowledge and resources to put you in touch with a physician who is right for you.    If you are having a true medical emergency please call 911.   Your e-visit answers were reviewed by a board certified advanced clinical practitioner to complete your personal care plan.  Thank you for using e-Visits.

## 2022-07-26 ENCOUNTER — Telehealth: Payer: Medicaid Other | Admitting: Nurse Practitioner

## 2022-07-26 DIAGNOSIS — N760 Acute vaginitis: Secondary | ICD-10-CM | POA: Diagnosis not present

## 2022-07-26 MED ORDER — FLUCONAZOLE 150 MG PO TABS: 150.0000 mg | ORAL_TABLET | Freq: Once | ORAL | 1 refills | Status: AC

## 2022-07-26 NOTE — Progress Notes (Signed)

## 2022-07-26 NOTE — Progress Notes (Signed)
I have spent 5 minutes in review of e-visit questionnaire, review and updating patient chart, medical decision making and response to patient.  ° °Malaina Mortellaro W Archer Vise, NP ° °  °

## 2022-09-19 ENCOUNTER — Telehealth: Payer: Medicaid Other | Admitting: Nurse Practitioner

## 2022-09-19 DIAGNOSIS — B3731 Acute candidiasis of vulva and vagina: Secondary | ICD-10-CM | POA: Diagnosis not present

## 2022-09-19 MED ORDER — FLUCONAZOLE 150 MG PO TABS: 150.0000 mg | ORAL_TABLET | Freq: Once | ORAL | 0 refills | Status: AC

## 2022-09-19 NOTE — Progress Notes (Signed)

## 2022-10-15 ENCOUNTER — Other Ambulatory Visit: Payer: Self-pay

## 2022-10-15 ENCOUNTER — Encounter (HOSPITAL_COMMUNITY): Payer: Self-pay | Admitting: Emergency Medicine

## 2022-10-15 ENCOUNTER — Emergency Department (HOSPITAL_COMMUNITY)
Admission: EM | Admit: 2022-10-15 | Discharge: 2022-10-16 | Disposition: A | Discharge status: Home / Self Care | Payer: Medicaid Other | Attending: Emergency Medicine | Admitting: Emergency Medicine

## 2022-10-15 DIAGNOSIS — E1165 Type 2 diabetes mellitus with hyperglycemia: Secondary | ICD-10-CM | POA: Insufficient documentation

## 2022-10-15 DIAGNOSIS — Z794 Long term (current) use of insulin: Secondary | ICD-10-CM | POA: Insufficient documentation

## 2022-10-15 DIAGNOSIS — R739 Hyperglycemia, unspecified: Secondary | ICD-10-CM

## 2022-10-15 DIAGNOSIS — I10 Essential (primary) hypertension: Secondary | ICD-10-CM | POA: Insufficient documentation

## 2022-10-15 DIAGNOSIS — E876 Hypokalemia: Secondary | ICD-10-CM | POA: Insufficient documentation

## 2022-10-15 LAB — CBC
HCT: 39.5 % (ref 36.0–46.0)
Hemoglobin: 12.8 g/dL (ref 12.0–15.0)
MCH: 27.2 pg (ref 26.0–34.0)
MCHC: 32.4 g/dL (ref 30.0–36.0)
MCV: 84 fL (ref 80.0–100.0)
Platelets: 219 10*3/uL (ref 150–400)
RBC: 4.7 MIL/uL (ref 3.87–5.11)
RDW: 13 % (ref 11.5–15.5)
WBC: 7.5 10*3/uL (ref 4.0–10.5)
nRBC: 0 % (ref 0.0–0.2)

## 2022-10-15 LAB — URINALYSIS, ROUTINE W REFLEX MICROSCOPIC
Bacteria, UA: NONE SEEN
Bilirubin Urine: NEGATIVE
Glucose, UA: >=500 mg/dL — AB
Hgb urine dipstick: NEGATIVE
Ketones, ur: NEGATIVE mg/dL
Leukocytes,Ua: NEGATIVE
Nitrite: NEGATIVE
Protein, ur: NEGATIVE mg/dL
Specific Gravity, Urine: 1.042 — ABNORMAL HIGH (ref 1.005–1.030)
pH: 6 (ref 5.0–8.0)

## 2022-10-15 LAB — BETA-HYDROXYBUTYRIC ACID: Beta-Hydroxybutyric Acid: 0.07 mmol/L (ref 0.05–0.27)

## 2022-10-15 LAB — HCG, SERUM, QUALITATIVE: Preg, Serum: NEGATIVE

## 2022-10-15 LAB — BASIC METABOLIC PANEL
Anion gap: 10 (ref 5–15)
BUN: 10 mg/dL (ref 6–20)
CO2: 26 mmol/L (ref 22–32)
Calcium: 8.9 mg/dL (ref 8.9–10.3)
Chloride: 99 mmol/L (ref 98–111)
Creatinine, Ser: 0.55 mg/dL (ref 0.44–1.00)
GFR, Estimated: >60 mL/min (ref >60)
Glucose, Bld: 337 mg/dL — ABNORMAL HIGH (ref 70–99)
Potassium: 3.3 mmol/L — ABNORMAL LOW (ref 3.5–5.1)
Sodium: 135 mmol/L (ref 135–145)

## 2022-10-15 LAB — CBG MONITORING, ED: Glucose-Capillary: 389 mg/dL — ABNORMAL HIGH (ref 70–99)

## 2022-10-15 NOTE — ED Triage Notes (Signed)
Patient here with high blood sugar ran out of insulin 2 weeks ago. Tried to get in with doctor to refill the insulin, test strips, pens, needles, etc. States she knew today her CBG was elevated due to feeling dehydrated. States this started out as gestational diabetes.

## 2022-10-16 LAB — CBG MONITORING, ED: Glucose-Capillary: 200 mg/dL — ABNORMAL HIGH (ref 70–99)

## 2022-10-16 MED ORDER — SODIUM CHLORIDE 0.9 % IV BOLUS
2000.0000 mL | Freq: Once | INTRAVENOUS | Status: AC
  Administered 2022-10-16: 2000 mL via INTRAVENOUS

## 2022-10-16 MED ORDER — POTASSIUM CHLORIDE CRYS ER 20 MEQ PO TBCR
40.0000 meq | EXTENDED_RELEASE_TABLET | Freq: Once | ORAL | Status: AC
  Administered 2022-10-16: 40 meq via ORAL
  Filled 2022-10-16: qty 2

## 2022-10-16 MED ORDER — INSULIN GLARGINE 100 UNIT/ML ~~LOC~~ SOLN: 10.0000 [IU] | Freq: Every day | SUBCUTANEOUS | 2 refills | Status: DC

## 2022-10-16 MED ORDER — INSULIN ASPART 100 UNIT/ML IJ SOLN
10.0000 [IU] | Freq: Once | INTRAMUSCULAR | Status: AC
  Administered 2022-10-16: 10 [IU] via SUBCUTANEOUS

## 2022-10-16 MED ORDER — BLOOD GLUCOSE METER KIT: PACK | 0 refills | Status: DC

## 2022-10-16 NOTE — Discharge Instructions (Signed)
Your high blood sugar was treated with fluids and insulin in the emergency department.  You have been prescribed insulin in the outpatient setting.  Please follow-up with your primary care doctor; return to the ER with any severe symptoms.

## 2022-10-16 NOTE — ED Provider Notes (Signed)
[facility] EMERGENCY DEPARTMENT AT [facility] Provider Note   CSN: [id number] Arrival date & time: 10/15/22  2226     History  Chief Complaint  Patient presents with   Hyperglycemia    Kelsey Jones is a 32 y.o. female with history of IDDM t2 who presents with concern for high CBG after running out o fher insulin 2 weeks ago.  States she has been trying to deliver PCP for refills but has not received a call back.  States that she has been feeling tired, having headache, feeling a little bit confused and urinating more than normal last couple of days.  Company by her partner at the bedside who states that to she has been a bit confused today.  No fevers chills or infectious symptoms.  I reviewed her medical records.  History of ASCUS as well.  HPI     Home Medications Prior to Admission medications   Medication Sig Start Date End Date Taking? Authorizing Provider  acetaminophen (TYLENOL) 500 MG tablet Take 500 mg by mouth every 6 (six) hours as needed for headache (cramps).    [provider]  blood glucose meter kit and supplies KIT Dispense based on patient and insurance preference. Use up to four times daily as directed. (FOR ICD-9 250.00, 250.01). 03/28/19   Graciella Freer A, PA-C  insulin glargine (LANTUS) 100 UNIT/ML injection Inject 10 Units into the skin at bedtime.    [provider]  ondansetron (ZOFRAN ODT) 4 MG disintegrating tablet Take 1 tablet (4 mg total) by mouth every 8 (eight) hours as needed for nausea or vomiting. 09/07/20   Ivette Loyal, NP  Prenatal Vit-Fe Fumarate-FA (PREPLUS) 27-1 MG TABS Take 1 tablet by mouth daily. 11/10/19   Calvert Cantor, CNM  terconazole (TERAZOL 7) 0.4 % vaginal cream Place 1 applicator vaginally at bedtime. 04/23/21   Donette Larry, CNM      Allergies    Patient has no known allergies.    Review of Systems   Review of Systems  Constitutional:  Positive for appetite change and fatigue.  Negative for activity change.  HENT: Negative.    Eyes: Negative.   Respiratory: Negative.    Cardiovascular: Negative.   Gastrointestinal: Negative.   Genitourinary:  Positive for frequency. Negative for decreased urine volume, enuresis, flank pain, urgency, vaginal bleeding, vaginal discharge and vaginal pain.  Musculoskeletal: Negative.   Neurological:  Positive for headaches. Negative for dizziness and light-headedness.    Physical Exam Updated Vital Signs BP 103/67   Pulse 90   Temp 98.8 F (37.1 C) (Oral)   Resp 16   Ht 5\' 7"  (1.702 m)   Wt 74 kg   SpO2 100%   BMI 25.55 kg/m  Physical Exam Vitals and nursing note reviewed.  Constitutional:      Appearance: She is not ill-appearing or toxic-appearing.  HENT:     Head: Normocephalic and atraumatic.     Mouth/Throat:     Mouth: Mucous membranes are moist.     Pharynx: No oropharyngeal exudate or posterior oropharyngeal erythema.  Eyes:     General:        Right eye: No discharge.        Left eye: No discharge.     Extraocular Movements: Extraocular movements intact.     Conjunctiva/sclera: Conjunctivae normal.     Pupils: Pupils are equal, round, and reactive to light.  Cardiovascular:     Rate and Rhythm: Normal rate and regular  rhythm.     Pulses: Normal pulses.     Heart sounds: Normal heart sounds. No murmur heard. Pulmonary:     Effort: Pulmonary effort is normal. No respiratory distress.     Breath sounds: Normal breath sounds. No wheezing or rales.  Abdominal:     General: Bowel sounds are normal. There is no distension.     Palpations: Abdomen is soft.     Tenderness: There is no abdominal tenderness. There is no right CVA tenderness, left CVA tenderness, guarding or rebound.  Musculoskeletal:        General: No deformity.     Cervical back: Neck supple.     Right lower leg: No edema.     Left lower leg: No edema.  Skin:    General: Skin is warm and dry.     Capillary Refill: Capillary refill takes  less than 2 seconds.  Neurological:     General: No focal deficit present.     Mental Status: She is alert and oriented to person, place, and time. Mental status is at baseline.  Psychiatric:        Mood and Affect: Mood normal.     ED Results / Procedures / Treatments   Labs (all labs ordered are listed, but only abnormal results are displayed) Labs Reviewed  BASIC METABOLIC PANEL - Abnormal; Notable for the following components:      Result Value   Potassium 3.3 (*)    Glucose, Bld 337 (*)    All other components within normal limits  URINALYSIS, ROUTINE W REFLEX MICROSCOPIC - Abnormal; Notable for the following components:   APPearance HAZY (*)    Specific Gravity, Urine 1.042 (*)    Glucose, UA >=500 (*)    All other components within normal limits  CBG MONITORING, ED - Abnormal; Notable for the following components:   Glucose-Capillary 389 (*)    All other components within normal limits  CBG MONITORING, ED - Abnormal; Notable for the following components:   Glucose-Capillary 311 (*)    All other components within normal limits  CBG MONITORING, ED - Abnormal; Notable for the following components:   Glucose-Capillary 200 (*)    All other components within normal limits  CBC  HCG, SERUM, QUALITATIVE  BETA-HYDROXYBUTYRIC ACID  CBG MONITORING, ED    EKG None  Radiology No results found.  Procedures Procedures    Medications Ordered in ED Medications  sodium chloride 0.9 % bolus 2,000 mL (0 mLs Intravenous Stopped 10/16/22 0605)  insulin aspart (novoLOG) injection 10 Units (10 Units Subcutaneous Given 10/16/22 0457)  potassium chloride SA (KLOR-CON M) CR tablet 40 mEq (40 mEq Oral Given 10/16/22 0456)    ED Course/ Medical Decision Making/ A&P                                 Medical Decision Making 32 year old female with history of diabetes who ran out of her insulin 2 weeks ago presents with concern for urinary frequency, fatigue, headache, and mild  confusion.  Mild hypertensive on intake, vitals otherwise normal.  Cardiopulmonary exam is normal, abdominal exam is benign.  Patient neurologically intact, ANO x 4.  Well-appearing.   Amount and/or Complexity of Data Reviewed Labs: ordered.    Details: CBC without leukocytosis or anemia.  BMP with hypokalemia of 3.3.  Otherwise unremarkable.  Beta hydroxybutyric acid is normal.  UA with greater than thousand glucose.  CBG elevated  to 389.  Risk Prescription drug management.   Will provide fluid bolus, subcu insulin and potassium repletion orally. CBG downtrending, 200.  Patient well-appearing, tolerating p.o.  Requesting discharge at this time.  Will refill Lantus for home, recommend increase hydration at home.  No evidence of ketoacidosis at this time, feel that is reasonable to discharge patient at this juncture.  Clinical concern for emergent underlying etiology that would warrant for further ED workup or inpatient management is exceedingly low.  Anjelah and her partner voiced understanding of her medical evaluation and treatment plan. Each of their questions answered to their expressed satisfaction.  Return precautions were given.  Patient is well-appearing, stable, and was discharged in good condition.  This chart was dictated using voice recognition software, Dragon. Despite the best efforts of this provider to proofread and correct errors, errors may still occur which can change documentation meaning.   Final Clinical Impression(s) / ED Diagnoses Final diagnoses:  None    Rx / DC Orders ED Discharge Orders     None         Sherrilee Gilles 10/16/22 0347    Glynn Octave, MD 10/16/22 [phone]

## 2022-10-17 ENCOUNTER — Telehealth: Payer: Medicaid Other | Admitting: Physician Assistant

## 2022-10-17 DIAGNOSIS — N760 Acute vaginitis: Secondary | ICD-10-CM

## 2022-10-17 DIAGNOSIS — B9689 Other specified bacterial agents as the cause of diseases classified elsewhere: Secondary | ICD-10-CM

## 2022-10-17 DIAGNOSIS — K649 Unspecified hemorrhoids: Secondary | ICD-10-CM

## 2022-10-17 MED ORDER — HYDROCORTISONE 2.5 % EX OINT
TOPICAL_OINTMENT | Freq: Two times a day (BID) | CUTANEOUS | 0 refills | Status: DC
Start: 2022-10-17 — End: 2023-12-30

## 2022-10-17 MED ORDER — METRONIDAZOLE 500 MG PO TABS
500.0000 mg | ORAL_TABLET | Freq: Two times a day (BID) | ORAL | 0 refills | Status: AC
Start: 2022-10-17 — End: 2022-10-24

## 2022-10-17 NOTE — Progress Notes (Signed)
E-Visit for Hemorrhoid  We are sorry that you are not feeling well. We are here to help!  Hemorrhoids are swollen veins in the rectum. They can cause itching, bleeding, and pain. Hemorrhoids are very common.  In some cases, you can see or feel hemorrhoids around the outside of the rectum. In other cases, you cannot see them because they are hidden inside the rectum. Be patient - It can take months for this to improve or go away.   Hemorrhoids do not always cause symptoms. But when they do, symptoms can include: ?Itching of the skin around the anus ?Bleeding - Bleeding is usually painless. You might see bright red blood after using the toilet. ?Pain - If a blood clot forms inside a hemorrhoid, this can cause pain. It can also cause a lump that you might be able to feel.   What can I do to keep from getting more hemorrhoids? -- The most important thing you can do is to keep from getting constipated. You should have a bowel movement at least a few times a week. When you have a bowel movement, you also should not have to push too much. Plus, your bowel movements should not be too hard. Being constipated and having hard bowel movements can make hemorrhoids worse.   I have prescribed Topical Hydrocortisone ointment 2.5%.  Apply to area two times per day for 30 days  HOME CARE: Sitz Baths twice daily. Soak buttocks in 2 or 3 inches of warm water for 10 to 15 minutes. Do not add soap, bubble bath, or anything to the water. Stool softener such as Colace 100 mg twice daily AND Miralax 1 scoop daily until you have regular soft stools Over the counter Preparation H Tucks Pads Witch Hazel  Here are some steps you can take to avoid getting constipated or having hard stools:  ?Eat lots of fruits, vegetables, and other foods with fiber. Fiber helps to increase bowel movements. If you do not get enough fiber from your diet, you can take fiber supplements. These come in the form of powders, wafers, or  pills. Some examples are Metamucil, Citrucel, Benefiber and FiberCon. If you take a fiber supplement, be sure to read the label so you know how much to take. If you're not sure, ask your provider or nurse. ?Take medicines called "stool softeners" such as docusate sodium (sample brand names: Colace, Dulcolax). These medicines increase the number of bowel movements you have. They are safe to take and they can prevent problems later.  You should request a referral for a follow up evaluation with a Gastroenterologist (GI doctor) to evaluate this chronic and relapsing condition - even if it improves to see what further steps need to be taken. This is highly linked to chronic constipation and straining to have a bowel movement. It may require further treatment or surgical intervention.   GET HELP RIGHT AWAY IF: You develop severe pain You have heavy bleeding   FOLLOW UP WITH YOUR PRIMARY PROVIDER IF: If your symptoms do not improve within 10 days  MAKE SURE YOU  Understand these instructions. Will watch your condition. Will get help right away if you are not doing well or get worse.   Thank you for choosing an e-visit.  Your e-visit answers were reviewed by a board certified advanced clinical practitioner to complete your personal care plan. Depending upon the condition, your plan could have included both over the counter or prescription medications.  Please review your pharmacy choice. Make   sure the pharmacy is open so you can pick up prescription now. If there is a problem, you may contact your provider through [employer] and have the prescription routed to another pharmacy.  Your safety is important to [place]. If you have drug allergies check your prescription carefully.   For the next 24 hours you can use MyChart to ask questions about today's visit, request a non-urgent call back, or ask for a work or school excuse. You will get an email in the next two days asking about your experience.  I hope that your e-visit has been valuable and will speed your recovery.  I have spent 5 minutes in review of e-visit questionnaire, review and updating patient chart, medical decision making and response to patient.   Mar Daring, PA-C

## 2022-10-17 NOTE — Progress Notes (Signed)
E-Visit for Vaginal Symptoms  We are sorry that you are not feeling well. Here is how we plan to help! Based on what you shared with me it looks like you: May have a vaginosis due to bacteria  Vaginosis is an inflammation of the vagina that can result in discharge, itching and pain. The cause is usually a change in the normal balance of vaginal bacteria or an infection. Vaginosis can also result from reduced estrogen levels after menopause.  The most common causes of vaginosis are:   Bacterial vaginosis which results from an overgrowth of one on several organisms that are normally present in your vagina.   Yeast infections which are caused by a naturally occurring fungus called candida.   Vaginal atrophy (atrophic vaginosis) which results from the thinning of the vagina from reduced estrogen levels after menopause.   Trichomoniasis which is caused by a parasite and is commonly transmitted by sexual intercourse.  Factors that increase your risk of developing vaginosis include: Medications, such as antibiotics and steroids Uncontrolled diabetes Use of hygiene products such as bubble bath, vaginal spray or vaginal deodorant Douching Wearing damp or tight-fitting clothing Using an intrauterine device (IUD) for birth control Hormonal changes, such as those associated with pregnancy, birth control pills or menopause Sexual activity Having a sexually transmitted infection  Your treatment plan is Metronidazole or Flagyl 500mg twice a day for 7 days.  I have electronically sent this prescription into the pharmacy that you have chosen.  Be sure to take all of the medication as directed. Stop taking any medication if you develop a rash, tongue swelling or shortness of breath. Mothers who are breast feeding should consider pumping and discarding their breast milk while on these antibiotics. However, there is no consensus that infant exposure at these doses would be harmful.  Remember that  medication creams can weaken latex condoms. .   HOME CARE:  Good hygiene may prevent some types of vaginosis from recurring and may relieve some symptoms:  Avoid baths, hot tubs and whirlpool spas. Rinse soap from your outer genital area after a shower, and dry the area well to prevent irritation. Don't use scented or harsh soaps, such as those with deodorant or antibacterial action. Avoid irritants. These include scented tampons and pads. Wipe from front to back after using the toilet. Doing so avoids spreading fecal bacteria to your vagina.  Other things that may help prevent vaginosis include:  Don't douche. Your vagina doesn't require cleansing other than normal bathing. Repetitive douching disrupts the normal organisms that reside in the vagina and can actually increase your risk of vaginal infection. Douching won't clear up a vaginal infection. Use a latex condom. Both female and female latex condoms may help you avoid infections spread by sexual contact. Wear cotton underwear. Also wear pantyhose with a cotton crotch. If you feel comfortable without it, skip wearing underwear to bed. Yeast thrives in moist environments Your symptoms should improve in the next day or two.  GET HELP RIGHT AWAY IF:  You have pain in your lower abdomen ( pelvic area or over your ovaries) You develop nausea or vomiting You develop a fever Your discharge changes or worsens You have persistent pain with intercourse You develop shortness of breath, a rapid pulse, or you faint.  These symptoms could be signs of problems or infections that need to be evaluated by a medical provider now.  MAKE SURE YOU   Understand these instructions. Will watch your condition. Will get help right   away if you are not doing well or get worse.  Thank you for choosing an e-visit.  Your e-visit answers were reviewed by a board certified advanced clinical practitioner to complete your personal care plan. Depending upon the  condition, your plan could have included both over the counter or prescription medications.  Please review your pharmacy choice. Make sure the pharmacy is open so you can pick up prescription now. If there is a problem, you may contact your provider through MyChart messaging and have the prescription routed to another pharmacy.  Your safety is important to us. If you have drug allergies check your prescription carefully.   For the next 24 hours you can use MyChart to ask questions about today's visit, request a non-urgent call back, or ask for a work or school excuse. You will get an email in the next two days asking about your experience. I hope that your e-visit has been valuable and will speed your recovery.  I have spent 5 minutes in review of e-visit questionnaire, review and updating patient chart, medical decision making and response to patient.   Deliyah Muckle M Lorrane Mccay, PA-C  

## 2022-10-21 ENCOUNTER — Telehealth: Payer: Medicaid Other | Admitting: Physician Assistant

## 2022-10-21 DIAGNOSIS — B379 Candidiasis, unspecified: Secondary | ICD-10-CM

## 2022-10-21 MED ORDER — FLUCONAZOLE 150 MG PO TABS: ORAL_TABLET | ORAL | 0 refills | Status: DC

## 2022-10-21 NOTE — Progress Notes (Signed)

## 2022-10-21 NOTE — Progress Notes (Signed)
I have spent 5 minutes in review of e-visit questionnaire, review and updating patient chart, medical decision making and response to patient.   Shonna Deiter Cody Tavares Levinson, PA-C    

## 2022-10-29 ENCOUNTER — Ambulatory Visit: Payer: Medicaid Other | Admitting: Obstetrics and Gynecology

## 2022-12-14 ENCOUNTER — Ambulatory Visit (HOSPITAL_COMMUNITY): Payer: Medicaid Other

## 2022-12-14 ENCOUNTER — Telehealth: Payer: Medicaid Other | Admitting: Family

## 2022-12-14 DIAGNOSIS — N898 Other specified noninflammatory disorders of vagina: Secondary | ICD-10-CM

## 2022-12-14 NOTE — Progress Notes (Signed)
After review you have had three vaginal discharge Evisits in the last month, I feel your condition warrants further evaluation and I recommend that you be seen in a face to face visit.   NOTE: There will be NO CHARGE for this eVisit   If you are having a true medical emergency please call 911.      For an urgent face to face visit, [place] has eight urgent care centers for your convenience:   NEW!! [facility] Health Urgent Care Center at [facility] Get Driving Directions [phone] [address], [address] [place], [address]    [facility] Health Urgent Care Center at [facility] Get Driving Directions [phone] [address] [place], [place] [address]   [facility] Care Center [facility]) Get Driving Directions [phone] [address] [place], [place] [address]  [facility] Health Urgent Care Center [facility]) Get Driving Directions [phone] [address] [place],  [place]  [address]  [facility] Health Urgent Care Center [facility] - at [employer]  [phone] [address] [place],  [place] [address]   [facility] Health Urgent Care at [facility] Get Driving Directions [phone] [address] [place], [place] [address]   [facility] Health Urgent Care at [facility] Get Driving Directions  [phone] [address] [place], [place] [address]   [facility] Health Urgent Care at [facility] Directions [phone] [address] [place], [place] [address]  Your MyChart E-visit questionnaire answers were reviewed by a board certified advanced clinical practitioner to complete your personal care plan based on your specific symptoms.  Thank you for using e-Visits.

## 2022-12-17 ENCOUNTER — Encounter (HOSPITAL_COMMUNITY): Payer: Self-pay | Admitting: Emergency Medicine

## 2022-12-17 ENCOUNTER — Ambulatory Visit (HOSPITAL_COMMUNITY)
Admission: EM | Admit: 2022-12-17 | Discharge: 2022-12-17 | Disposition: A | Discharge status: Home / Self Care | Payer: Medicaid Other | Attending: Internal Medicine | Admitting: Internal Medicine

## 2022-12-17 DIAGNOSIS — Z113 Encounter for screening for infections with a predominantly sexual mode of transmission: Secondary | ICD-10-CM | POA: Diagnosis not present

## 2022-12-17 DIAGNOSIS — N898 Other specified noninflammatory disorders of vagina: Secondary | ICD-10-CM | POA: Diagnosis not present

## 2022-12-17 DIAGNOSIS — Z3202 Encounter for pregnancy test, result negative: Secondary | ICD-10-CM | POA: Insufficient documentation

## 2022-12-17 LAB — POCT URINALYSIS DIP (MANUAL ENTRY)
Bilirubin, UA: NEGATIVE
Blood, UA: NEGATIVE
Glucose, UA: >=1000 mg/dL — AB
Leukocytes, UA: NEGATIVE
Nitrite, UA: NEGATIVE
Protein Ur, POC: NEGATIVE mg/dL
Spec Grav, UA: 1.025 (ref 1.010–1.025)
Urobilinogen, UA: 0.2 U/dL
pH, UA: 6.5 (ref 5.0–8.0)

## 2022-12-17 LAB — HIV ANTIBODY (ROUTINE TESTING W REFLEX): HIV Screen 4th Generation wRfx: NONREACTIVE

## 2022-12-17 LAB — POCT URINE PREGNANCY: Preg Test, Ur: NEGATIVE

## 2022-12-17 NOTE — ED Provider Notes (Signed)
[facility]    CSN: [id number] Arrival date & time: 12/17/22  1212      History   Chief Complaint Chief Complaint  Patient presents with   SEXUALLY TRANSMITTED DISEASE    HPI Kelsey Jones is a 32 y.o. female.   Patient presents to clinic for abnormal vaginal discharge with vaginal itching that has been present for the past week.  She has a significant other who she recently found out he has other sexual partners.  Reports sharp pains to her lower abdomen that have been ongoing for the past month.  Denies any dysuria.  Did have some lower back pain that started last week as well.  No fevers, no nausea or vomiting.  Would like HIV and syphilis screening.   The history is provided by the patient and medical records.    Past Medical History:  Diagnosis Date   Gestational diabetes    metformin   Medical history non-contributory    Short cervix    Type 2 diabetes mellitus (HCC)     Patient Active Problem List   Diagnosis Date Noted   Gestational diabetes 08/03/2016   Medication controlled gestational diabetes mellitus in third trimester 05/16/2016   Short cervix affecting pregnancy 04/04/2016   Low vitamin D level 01/14/2016   Supervision of high risk pregnancy, antepartum 01/09/2016   Prior pregnancy complicated by short cervix, antepartum 01/09/2016   ASCUS pap smear but negative high-risk HPV on 10/16/2015.    10/23/2015    Past Surgical History:  Procedure Laterality Date   INDUCED ABORTION      OB History     Gravida  4   Para  2   Term  2   Preterm  0   AB  1   Living  2      SAB  0   IAB  1   Ectopic  0   Multiple  0   Live Births  2            Home Medications    Prior to Admission medications   Medication Sig Start Date End Date Taking? Authorizing Provider  insulin glargine (LANTUS) 100 UNIT/ML injection Inject 10 Units into the skin at bedtime.   Yes [provider]  acetaminophen (TYLENOL) 500 MG  tablet Take 500 mg by mouth every 6 (six) hours as needed for headache (cramps).    [provider]  blood glucose meter kit and supplies KIT Dispense based on patient and insurance preference. Use up to four times daily as directed. (FOR ICD-9 250.00, 250.01). 03/28/19   Graciella Freer A, PA-C  blood glucose meter kit and supplies Dispense based on patient and insurance preference. Use up to four times daily as directed. (FOR ICD-10 E10.9, E11.9). 10/16/22   Sponseller, Lupe Carney R, PA-C  fluconazole (DIFLUCAN) 150 MG tablet Take 1 tablet PO once. Repeat in 3 days if needed. 10/21/22   Waldon Merl, PA-C  hydrocortisone 2.5 % ointment Apply topically 2 (two) times daily. 10/17/22   Margaretann Loveless, PA-C  insulin glargine (LANTUS) 100 UNIT/ML injection Inject 0.1 mLs (10 Units total) into the skin at bedtime. 10/16/22 01/14/23  Sponseller, Lupe Carney R, PA-C  ondansetron (ZOFRAN ODT) 4 MG disintegrating tablet Take 1 tablet (4 mg total) by mouth every 8 (eight) hours as needed for nausea or vomiting. 09/07/20   Ivette Loyal, NP  Prenatal Vit-Fe Fumarate-FA (PREPLUS) 27-1 MG TABS Take 1 tablet by mouth daily. 11/10/19  Clayton Bibles C, CNM  terconazole (TERAZOL 7) 0.4 % vaginal cream Place 1 applicator vaginally at bedtime. 04/23/21   Donette Larry, CNM    Family History Family History  Problem Relation Age of Onset   Diabetes Mother    Cancer Maternal Grandmother    Diabetes Maternal Grandmother    Cancer Maternal Grandfather     Social History Social History   Tobacco Use   Smoking status: Former    Types: Cigars   Smokeless tobacco: Never  Vaping Use   Vaping status: Never Used  Substance Use Topics   Alcohol use: No    Comment: Occasionally   Drug use: No     Allergies   Patient has no known allergies.   Review of Systems Review of Systems  Constitutional:  Negative for fever.  Gastrointestinal:  Positive for abdominal pain. Negative for nausea and  vomiting.  Genitourinary:  Positive for flank pain and vaginal discharge. Negative for dysuria, frequency, genital sores and menstrual problem.  Musculoskeletal:  Positive for back pain.     Physical Exam Triage Vital Signs ED Triage Vitals  Encounter Vitals Group     BP 12/17/22 1251 105/71     Systolic BP Percentile --      Diastolic BP Percentile --      Pulse Rate 12/17/22 1251 91     Resp 12/17/22 1251 16     Temp 12/17/22 1251 99.4 F (37.4 C)     Temp Source 12/17/22 1251 Oral     SpO2 12/17/22 1251 98 %     Weight --      Height --      Head Circumference --      Peak Flow --      Pain Score 12/17/22 1252 0     Pain Loc --      Pain Education --      Exclude from Growth Chart --    No data found.  Updated Vital Signs BP 105/71 (BP Location: Right Arm)   Pulse 91   Temp 99.4 F (37.4 C) (Oral)   Resp 16   LMP 11/29/2022   SpO2 98%   Visual Acuity Right Eye Distance:   Left Eye Distance:   Bilateral Distance:    Right Eye Near:   Left Eye Near:    Bilateral Near:     Physical Exam Vitals and nursing note reviewed.  Constitutional:      Appearance: Normal appearance.  HENT:     Head: Normocephalic and atraumatic.     Right Ear: External ear normal.     Left Ear: External ear normal.     Mouth/Throat:     Mouth: Mucous membranes are moist.  Eyes:     Conjunctiva/sclera: Conjunctivae normal.  Cardiovascular:     Rate and Rhythm: Normal rate.  Pulmonary:     Effort: Pulmonary effort is normal. No respiratory distress.  Abdominal:     General: Abdomen is flat. Bowel sounds are normal. There is no distension.     Palpations: Abdomen is soft. There is no mass.     Tenderness: There is no abdominal tenderness. There is no right CVA tenderness, left CVA tenderness, guarding or rebound.  Musculoskeletal:        General: Normal range of motion.  Skin:    General: Skin is warm and dry.  Neurological:     General: No focal deficit present.     Mental  Status: She is alert and oriented  to person, place, and time.  Psychiatric:        Mood and Affect: Mood normal.        Behavior: Behavior normal. Behavior is cooperative.      UC Treatments / Results  Labs (all labs ordered are listed, but only abnormal results are displayed) Labs Reviewed  POCT URINALYSIS DIP (MANUAL ENTRY) - Abnormal; Notable for the following components:      Result Value   Glucose, UA >=1,000 (*)    Ketones, POC UA small (15) (*)    All other components within normal limits  RPR  HIV ANTIBODY (ROUTINE TESTING W REFLEX)  POCT URINE PREGNANCY  CERVICOVAGINAL ANCILLARY ONLY    EKG   Radiology No results found.  Procedures Procedures (including critical care time)  Medications Ordered in UC Medications - No data to display  Initial Impression / Assessment and Plan / UC Course  I have reviewed the triage vital signs and the nursing notes.  Pertinent labs & imaging results that were available during my care of the patient were reviewed by me and considered in my medical decision making (see chart for details).  Vitals and triage reviewed, patient is hemodynamically stable.  Abnormal vaginal discharge, cytology swab, HIV and syphilis screening obtained.  Urinalysis does not show any acute infection, urine pregnancy also negative.  Abdomen is soft and nontender with active bowel sounds, low concern for acute abdominal etiology.  Plan of care, follow-up care and return precautions given, no questions at this time.     Final Clinical Impressions(s) / UC Diagnoses   Final diagnoses:  Vaginal discharge  Screening examination for sexually transmitted disease  Urine pregnancy test negative     Discharge Instructions      Your urine did not show any signs of infection.  Your urine pregnancy test was negative.  The cytology swab and labs will be back over the next few days and our staff will contact you if anything requires follow-up or treatment.   Abstain from intercourse until results have been received and notify any sexual partners of positive test.  Return to clinic for any new or urgent symptoms.     ED Prescriptions   None    PDMP not reviewed this encounter.   Alira Fretwell, [place], [place] 12/17/22 1320

## 2022-12-17 NOTE — ED Triage Notes (Signed)
Reports she found out her boyfriend cheated on her. Has noticed new yellow vaginal discharge x 1 week. Reports vaginal itching starting around the same time, also noticed new sharp pains occurring in lower abdomin. Denies dysuria, fever. Requesting STD screening with blood work.

## 2022-12-17 NOTE — Discharge Instructions (Addendum)
Your urine did not show any signs of infection.  Your urine pregnancy test was negative.  The cytology swab and labs will be back over the next few days and our staff will contact you if anything requires follow-up or treatment.  Abstain from intercourse until results have been received and notify any sexual partners of positive test.  Return to clinic for any new or urgent symptoms.

## 2022-12-18 ENCOUNTER — Telehealth: Payer: Self-pay

## 2022-12-18 LAB — RPR: RPR Ser Ql: NONREACTIVE

## 2022-12-18 MED ORDER — FLUCONAZOLE 150 MG PO TABS: 150.0000 mg | ORAL_TABLET | Freq: Once | ORAL | 0 refills | Status: AC

## 2022-12-18 NOTE — Telephone Encounter (Signed)
 Per protocol, pt requires tx with Diflucan. Reviewed with patient, verified pharmacy, prescription sent.

## 2022-12-22 LAB — CERVICOVAGINAL ANCILLARY ONLY
Bacterial Vaginitis (gardnerella): NEGATIVE
Candida Glabrata: NEGATIVE
Candida Vaginitis: POSITIVE — AB
Chlamydia: NEGATIVE
Comment: NEGATIVE
Comment: NEGATIVE
Comment: NEGATIVE
Comment: NEGATIVE
Comment: NEGATIVE
Comment: NORMAL
Neisseria Gonorrhea: NEGATIVE
Trichomonas: NEGATIVE

## 2022-12-30 ENCOUNTER — Other Ambulatory Visit (HOSPITAL_COMMUNITY): Payer: Self-pay

## 2022-12-30 ENCOUNTER — Telehealth: Payer: Medicaid Other | Admitting: Physician Assistant

## 2022-12-30 DIAGNOSIS — B3731 Acute candidiasis of vulva and vagina: Secondary | ICD-10-CM

## 2022-12-30 MED ORDER — FLUCONAZOLE 150 MG PO TABS
150.0000 mg | ORAL_TABLET | ORAL | 0 refills | Status: DC
Start: 2022-12-30 — End: 2023-02-25
  Filled 2022-12-30: qty 2, 6d supply, fill #0

## 2022-12-30 NOTE — Progress Notes (Signed)
I have spent 5 minutes in review of e-visit questionnaire, review and updating patient chart, medical decision making and response to patient.   Mia Milan Cody Jacklynn Dehaas, PA-C    

## 2022-12-30 NOTE — Progress Notes (Signed)

## 2022-12-31 DIAGNOSIS — Z1231 Encounter for screening mammogram for malignant neoplasm of breast: Secondary | ICD-10-CM

## 2023-01-27 ENCOUNTER — Telehealth: Payer: Medicaid Other

## 2023-01-27 DIAGNOSIS — N760 Acute vaginitis: Secondary | ICD-10-CM

## 2023-01-28 NOTE — Progress Notes (Signed)
Because of recurrent vaginitis with treatment for a yeast vaginitis in the past month and need for repeat testing, I feel your condition warrants further evaluation and I recommend that you be seen in a face to face visit.   NOTE: There will be NO CHARGE for this eVisit   If you are having a true medical emergency please call 911.      For an urgent face to face visit, [facility] has eight urgent care centers for your convenience:   NEW!! [facility] Health Urgent Care Center at [facility] Get Driving Directions [phone] [address], [address] [place], [address]    [facility] Health Urgent Care Center at [facility] Get Driving Directions [phone] [address] [place], [place] [address]   [facility] Health Urgent Care Center [facility]) Get Driving Directions [phone] [address] [place], [place] [address]  [facility] Health Urgent Care Center [facility]) Get Driving Directions [phone] [address] [place],  [place]  [address]  [facility] Health Urgent Care Center [facility] - at [employer]  [phone] [address] [place],  [place] [address]   [facility] Health Urgent Care at [facility] Get Driving Directions [phone] [address] [place], [place] [address]   [facility] Health Urgent Care at [facility] Get Driving Directions  [phone] [address] [place], [place] [address]   [facility] Health Urgent Care at [facility] Directions [phone] [address], [address] [place], [place] [address]  Your MyChart E-visit questionnaire answers were reviewed by a board certified advanced clinical practitioner to complete your personal care plan based on your specific symptoms.  Thank you for using e-Visits.

## 2023-02-25 ENCOUNTER — Other Ambulatory Visit (HOSPITAL_COMMUNITY): Payer: Self-pay

## 2023-02-25 ENCOUNTER — Telehealth: Payer: Medicaid Other | Admitting: Physician Assistant

## 2023-02-25 DIAGNOSIS — N760 Acute vaginitis: Secondary | ICD-10-CM | POA: Diagnosis not present

## 2023-02-25 DIAGNOSIS — B3731 Acute candidiasis of vulva and vagina: Secondary | ICD-10-CM | POA: Diagnosis not present

## 2023-02-25 MED ORDER — FLUCONAZOLE 150 MG PO TABS
150.0000 mg | ORAL_TABLET | Freq: Once | ORAL | 0 refills | Status: AC
Start: 2023-02-25 — End: 2023-02-25
  Filled 2023-02-25: qty 1, 1d supply, fill #0

## 2023-02-25 NOTE — Progress Notes (Signed)

## 2023-03-25 ENCOUNTER — Telehealth: Payer: Medicaid Other | Admitting: Nurse Practitioner

## 2023-03-25 DIAGNOSIS — B3731 Acute candidiasis of vulva and vagina: Secondary | ICD-10-CM | POA: Diagnosis not present

## 2023-03-25 MED ORDER — FLUCONAZOLE 150 MG PO TABS
150.0000 mg | ORAL_TABLET | Freq: Once | ORAL | 0 refills | Status: AC
Start: 2023-03-25 — End: 2023-03-25

## 2023-03-25 NOTE — Progress Notes (Signed)
 E-Visit for Vaginal Symptoms   Alexsys- Due to the chronic recurrent nature of this you should talk to your primary care or OBGYN about ways to prevent this from happening. You may need to change the period products you are using that could be causing extra irritation    We are sorry that you are not feeling well. Here is how we plan to help! Based on what you shared with me it looks like you: May have a yeast vaginosis  Vaginosis is an inflammation of the vagina that can result in discharge, itching and pain. The cause is usually a change in the normal balance of vaginal bacteria or an infection. Vaginosis can also result from reduced estrogen levels after menopause.  The most common causes of vaginosis are:   Bacterial vaginosis which results from an overgrowth of one on several organisms that are normally present in your vagina.   Yeast infections which are caused by a naturally occurring fungus called candida.   Vaginal atrophy (atrophic vaginosis) which results from the thinning of the vagina from reduced estrogen levels after menopause.   Trichomoniasis which is caused by a parasite and is commonly transmitted by sexual intercourse.  Factors that increase your risk of developing vaginosis include: Medications, such as antibiotics and steroids Uncontrolled diabetes Use of hygiene products such as bubble bath, vaginal spray or vaginal deodorant Douching Wearing damp or tight-fitting clothing Using an intrauterine device (IUD) for birth control Hormonal changes, such as those associated with pregnancy, birth control pills or menopause Sexual activity Having a sexually transmitted infection  Your treatment plan is A single Diflucan  (fluconazole ) 150mg  tablet once.  I have electronically sent this prescription into the pharmacy that you have chosen.  Be sure to take all of the medication as directed. Stop taking any medication if you develop a rash, tongue swelling or shortness of  breath. Mothers who are breast feeding should consider pumping and discarding their breast milk while on these antibiotics. However, there is no consensus that infant exposure at these doses would be harmful.  Remember that medication creams can weaken latex condoms. SABRA   HOME CARE:  Good hygiene may prevent some types of vaginosis from recurring and may relieve some symptoms:  Avoid baths, hot tubs and whirlpool spas. Rinse soap from your outer genital area after a shower, and dry the area well to prevent irritation. Don't use scented or harsh soaps, such as those with deodorant or antibacterial action. Avoid irritants. These include scented tampons and pads. Wipe from front to back after using the toilet. Doing so avoids spreading fecal bacteria to your vagina.  Other things that may help prevent vaginosis include:  Don't douche. Your vagina doesn't require cleansing other than normal bathing. Repetitive douching disrupts the normal organisms that reside in the vagina and can actually increase your risk of vaginal infection. Douching won't clear up a vaginal infection. Use a latex condom. Both female and female latex condoms may help you avoid infections spread by sexual contact. Wear cotton underwear. Also wear pantyhose with a cotton crotch. If you feel comfortable without it, skip wearing underwear to bed. Yeast thrives in [place] Your symptoms should improve in the next day or two.  GET HELP RIGHT AWAY IF:  You have pain in your lower abdomen ( pelvic area or over your ovaries) You develop nausea or vomiting You develop a fever Your discharge changes or worsens You have persistent pain with intercourse You develop shortness of breath, a rapid  pulse, or you faint.  These symptoms could be signs of problems or infections that need to be evaluated by a medical provider now.  MAKE SURE YOU   Understand these instructions. Will watch your condition. Will get help right  away if you are not doing well or get worse.  Thank you for choosing an e-visit.  Your e-visit answers were reviewed by a board certified advanced clinical practitioner to complete your personal care plan. Depending upon the condition, your plan could have included both over the counter or prescription medications.  Please review your pharmacy choice. Make sure the pharmacy is open so you can pick up prescription now. If there is a problem, you may contact your provider through [employer] and have the prescription routed to another pharmacy.  Your safety is important to us . If you have drug allergies check your prescription carefully.   For the next 24 hours you can use MyChart to ask questions about today's visit, request a non-urgent call back, or ask for a work or school excuse. You will get an email in the next two days asking about your experience. I hope that your e-visit has been valuable and will speed your recovery.   I spent approximately 5 minutes reviewing the patient's history, current symptoms and coordinating their care today.

## 2023-03-26 ENCOUNTER — Telehealth: Payer: Medicaid Other | Admitting: Family Medicine

## 2023-03-26 ENCOUNTER — Encounter: Payer: Self-pay | Admitting: Family Medicine

## 2023-03-26 NOTE — Progress Notes (Signed)
 The patient no-showed for appointment despite this provider sending direct link, reaching out via phone with no response and waiting for at least 10 minutes from appointment time for patient to join. They will be marked as a NS for this appointment/time.   Freddy Finner, NP

## 2023-03-30 ENCOUNTER — Telehealth: Payer: Medicaid Other | Admitting: Family Medicine

## 2023-03-30 DIAGNOSIS — R3989 Other symptoms and signs involving the genitourinary system: Secondary | ICD-10-CM

## 2023-03-30 MED ORDER — CEPHALEXIN 500 MG PO CAPS
500.0000 mg | ORAL_CAPSULE | Freq: Two times a day (BID) | ORAL | 0 refills | Status: AC
Start: 2023-03-30 — End: 2023-04-06

## 2023-03-30 NOTE — Progress Notes (Signed)

## 2023-06-30 ENCOUNTER — Telehealth: Admitting: Physician Assistant

## 2023-06-30 DIAGNOSIS — B3731 Acute candidiasis of vulva and vagina: Secondary | ICD-10-CM | POA: Diagnosis not present

## 2023-06-30 MED ORDER — FLUCONAZOLE 150 MG PO TABS: ORAL_TABLET | ORAL | 0 refills | Status: DC

## 2023-06-30 NOTE — Progress Notes (Signed)

## 2023-06-30 NOTE — Progress Notes (Signed)
 I have spent 5 minutes in review of e-visit questionnaire, review and updating patient chart, medical decision making and response to patient.   Piedad Climes, PA-C

## 2023-07-03 ENCOUNTER — Emergency Department (HOSPITAL_COMMUNITY)
Admission: EM | Admit: 2023-07-03 | Discharge: 2023-07-04 | Disposition: A | Discharge status: Home / Self Care | Attending: Emergency Medicine | Admitting: Emergency Medicine

## 2023-07-03 ENCOUNTER — Encounter (HOSPITAL_COMMUNITY): Payer: Self-pay | Admitting: *Deleted

## 2023-07-03 ENCOUNTER — Emergency Department (HOSPITAL_COMMUNITY)

## 2023-07-03 ENCOUNTER — Other Ambulatory Visit: Payer: Self-pay

## 2023-07-03 DIAGNOSIS — J069 Acute upper respiratory infection, unspecified: Secondary | ICD-10-CM | POA: Diagnosis not present

## 2023-07-03 DIAGNOSIS — Z794 Long term (current) use of insulin: Secondary | ICD-10-CM | POA: Diagnosis not present

## 2023-07-03 DIAGNOSIS — E1165 Type 2 diabetes mellitus with hyperglycemia: Secondary | ICD-10-CM | POA: Diagnosis not present

## 2023-07-03 DIAGNOSIS — R739 Hyperglycemia, unspecified: Secondary | ICD-10-CM

## 2023-07-03 DIAGNOSIS — R059 Cough, unspecified: Secondary | ICD-10-CM | POA: Diagnosis present

## 2023-07-03 LAB — CBG MONITORING, ED
Glucose-Capillary: 269 mg/dL — ABNORMAL HIGH (ref 70–99)
Glucose-Capillary: 387 mg/dL — ABNORMAL HIGH (ref 70–99)

## 2023-07-03 LAB — CBC
HCT: 37.7 % (ref 36.0–46.0)
Hemoglobin: 12.1 g/dL (ref 12.0–15.0)
MCH: 26.2 pg (ref 26.0–34.0)
MCHC: 32.1 g/dL (ref 30.0–36.0)
MCV: 81.6 fL (ref 80.0–100.0)
Platelets: 236 10*3/uL (ref 150–400)
RBC: 4.62 MIL/uL (ref 3.87–5.11)
RDW: 13.1 % (ref 11.5–15.5)
WBC: 7.5 10*3/uL (ref 4.0–10.5)
nRBC: 0 % (ref 0.0–0.2)

## 2023-07-03 LAB — BASIC METABOLIC PANEL WITH GFR
Anion gap: 8 (ref 5–15)
BUN: 16 mg/dL (ref 6–20)
CO2: 25 mmol/L (ref 22–32)
Calcium: 9.1 mg/dL (ref 8.9–10.3)
Chloride: 101 mmol/L (ref 98–111)
Creatinine, Ser: 0.73 mg/dL (ref 0.44–1.00)
GFR, Estimated: >60 mL/min (ref >60)
Glucose, Bld: 286 mg/dL — ABNORMAL HIGH (ref 70–99)
Potassium: 3.5 mmol/L (ref 3.5–5.1)
Sodium: 134 mmol/L — ABNORMAL LOW (ref 135–145)

## 2023-07-03 LAB — RESP PANEL BY RT-PCR (RSV, FLU A&B, COVID)  RVPGX2
Influenza A by PCR: NEGATIVE
Influenza B by PCR: NEGATIVE
Resp Syncytial Virus by PCR: NEGATIVE
SARS Coronavirus 2 by RT PCR: NEGATIVE

## 2023-07-03 MED ORDER — ACETAMINOPHEN 500 MG PO TABS
1000.0000 mg | ORAL_TABLET | Freq: Once | ORAL | Status: AC
  Administered 2023-07-03: 1000 mg via ORAL
  Filled 2023-07-03: qty 2

## 2023-07-03 MED ORDER — INSULIN GLARGINE 100 UNIT/ML ~~LOC~~ SOLN: 10.0000 [IU] | Freq: Every day | SUBCUTANEOUS | 2 refills | Status: DC

## 2023-07-03 NOTE — ED Provider Notes (Cosign Needed Addendum)
 [place] EMERGENCY DEPARTMENT AT [facility] Provider Note   CSN: [id number] Arrival date & time: 07/03/23  1800     History  Chief Complaint  Patient presents with   URI    Kelsey Jones  is a 33 y.o. female with past medical history of diabetes on insulin  presents due to URI symptoms.  Patient states that over the last 2 days, she has had a cough, congestion, runny nose.  Cough is a mixture of productive and nonproductive.  Also experiencing chills and hot flashes.  She experiences chest pain with cough and shortness of breath with exertion with the symptoms.  She initially presented to urgent care, who wanted her to go to the ED for rule out of DKA with her hyperglycemia and mild tachycardia.  Patient states has been taking her insulin  as prescribed, though she is on her last pen.  She does not take any OCPs.  No lower extremity swelling.  No prolonged periods of inactivity or history of blood clots.  No hemoptysis.  Patient has not administered insulin  since 1 PM today.  Has eaten since.   URI      Home Medications Prior to Admission medications   Medication Sig Start Date End Date Taking? Authorizing Provider  acetaminophen  (TYLENOL ) 500 MG tablet Take 500 mg by mouth every 6 (six) hours as needed for headache (cramps).    [provider]  blood glucose meter kit and supplies KIT Dispense based on patient and insurance preference. Use up to four times daily as directed. (FOR ICD-9 250.00, 250.01). 03/28/19   Shon Downing A, PA-C  blood glucose meter kit and supplies Dispense based on patient and insurance preference. Use up to four times daily as directed. (FOR ICD-10 E10.9, E11.9). 10/16/22   Sponseller, Anola Basques R, PA-C  fluconazole  (DIFLUCAN ) 150 MG tablet Take 1 tablet PO once. Repeat in 3 days if needed. 06/30/23   Farris Hong, PA-C  hydrocortisone  2.5 % ointment Apply topically 2 (two) times daily. 10/17/22   Angelia Kelp, PA-C  insulin   glargine (LANTUS ) 100 UNIT/ML injection Inject 10 Units into the skin at bedtime.    [provider]  insulin  glargine (LANTUS ) 100 UNIT/ML injection Inject 0.1 mLs (10 Units total) into the skin at bedtime. 07/03/23 10/01/23  Lorain Robson, MD  ondansetron  (ZOFRAN  ODT) 4 MG disintegrating tablet Take 1 tablet (4 mg total) by mouth every 8 (eight) hours as needed for nausea or vomiting. 09/07/20   Erlinda Haws, NP  Prenatal Vit-Fe Fumarate-FA (PREPLUS) 27-1 MG TABS Take 1 tablet by mouth daily. 11/10/19   Weinhold, Samantha C, CNM      Allergies    Patient has no known allergies.    Review of Systems   Review of Systems  Physical Exam Updated Vital Signs BP 107/79 (BP Location: Right Arm)   Pulse (!) 106   Temp 99 F (37.2 C)   Resp 18   LMP 07/03/2023 (Exact Date)   SpO2 97%  Physical Exam Vitals and nursing note reviewed.  Constitutional:      General: She is not in acute distress.    Appearance: She is well-developed.  HENT:     Head: Normocephalic and atraumatic.     Nose: Congestion and rhinorrhea present.     Mouth/Throat:     Mouth: Mucous membranes are moist.     Pharynx: No oropharyngeal exudate.     Comments: Mild oropharyngeal erythema with no purulence or swelling, no trismus  Eyes:     Extraocular Movements: Extraocular movements intact.     Conjunctiva/sclera: Conjunctivae normal.  Cardiovascular:     Rate and Rhythm: Normal rate and regular rhythm.     Heart sounds: No murmur heard. Pulmonary:     Effort: Pulmonary effort is normal. No respiratory distress.     Breath sounds: Normal breath sounds. No wheezing or rales.  Chest:     Chest wall: No tenderness.  Abdominal:     General: Abdomen is flat. There is no distension.     Palpations: Abdomen is soft.     Tenderness: There is no abdominal tenderness. There is no guarding or rebound.  Musculoskeletal:        General: No swelling.     Cervical back: Neck supple.     Right lower leg: No edema.      Left lower leg: No edema.  Skin:    General: Skin is warm and dry.     Capillary Refill: Capillary refill takes less than 2 seconds.  Neurological:     Mental Status: She is alert.  Psychiatric:        Mood and Affect: Mood normal.     ED Results / Procedures / Treatments   Labs (all labs ordered are listed, but only abnormal results are displayed) Labs Reviewed  BASIC METABOLIC PANEL WITH GFR - Abnormal; Notable for the following components:      Result Value   Sodium 134 (*)    Glucose, Bld 286 (*)    All other components within normal limits  CBG MONITORING, ED - Abnormal; Notable for the following components:   Glucose-Capillary 269 (*)    All other components within normal limits  CBG MONITORING, ED - Abnormal; Notable for the following components:   Glucose-Capillary 387 (*)    All other components within normal limits  RESP PANEL BY RT-PCR (RSV, FLU A&B, COVID)  [id number]  CBC    EKG EKG Interpretation Date/Time:  Friday July 03 2023 18:10:36 EDT Ventricular Rate:  115 PR Interval:  136 QRS Duration:  80 QT Interval:  328 QTC Calculation: 453 R Axis:   72  Text Interpretation: Sinus tachycardia Nonspecific T wave abnormality Abnormal ECG When compared with ECG of 27-Mar-2019 22:08, PREVIOUS ECG IS PRESENT Confirmed by Early Glisson ([id number]) on 07/03/2023 10:22:39 PM  Radiology DG Chest 2 View Result Date: 07/03/2023 CLINICAL DATA:  Productive cough. EXAM: CHEST - 2 VIEW COMPARISON:  March 27, 2025 FINDINGS: The heart size and mediastinal contours are within normal limits. Both lungs are clear. The visualized skeletal structures are unremarkable. IMPRESSION: No active cardiopulmonary disease. Electronically Signed   By: Virgle Grime M.D.   On: 07/03/2023 21:34    Procedures Procedures    Medications Ordered in ED Medications  acetaminophen  (TYLENOL ) tablet 1,000 mg (1,000 mg Oral Given 07/03/23 1825)    ED Course/ Medical Decision Making/ A&P                                  Medical Decision Making Amount and/or Complexity of Data Reviewed Labs: ordered. Radiology: ordered.  Risk OTC drugs. Prescription drug management.   While in the waiting room, patient was noted to be febrile up to 100.9 and tachycardic to the 110s.  On my assessment, she is no longer febrile or tachycardic after having received Tylenol , physical exam as noted above.  Presentation consistent with URI.  Will  ensure no pneumonia.  Patient has obvious infectious symptoms, low concern for PE, especially since tachycardia resolved as she defervesced.  Workup obtained through triage demonstrates no leukocytosis, normal hemoglobin, unremarkable BMP with glucose 286 and anion gap 8.  No concern for DKA at this time.  COVID/flu/RSV test negative.  I personally interpreted patient's chest x-ray, which demonstrated no focal consolidations concerning for pneumonia.  I personally interpreted patient's EKG, which demonstrated sinus tachycardia with normal intervals (slightly prolonged Qtc of 453) and no acute ischemic changes, no obvious dysrhythmias.  Repeat POC glucose 387 but do not believe she developed DKA over the course of a few hours given no dehydration, no tachycardia.  We discussed symptomatic management at home with antipyretics, and the importance of staying adequately hydrated.  Also emphasized diligent CBG checks in the setting of acute illness.  She is agreeable.  Patient states that she is on her last EpiPen and does not have any more refills, so I represcribed this for her and suggested close PCP follow-up.  Strict return precautions were given, and patient was discharged in stable condition.  Patient seen in conjunction with Dr. Annabell Key, who agreed with the above work-up and plan of care.        Final Clinical Impression(s) / ED Diagnoses Final diagnoses:  Hyperglycemia  Upper respiratory tract infection, unspecified type    Rx / DC Orders ED  Discharge Orders          Ordered    insulin  glargine (LANTUS ) 100 UNIT/ML injection  Daily at bedtime        07/03/23 2252              Lorain Robson, MD 07/03/23 2253    Lorain Robson, MD 07/03/23 2254    Early Glisson, MD 07/04/23 [phone]

## 2023-07-03 NOTE — ED Triage Notes (Addendum)
 Pt states that she has had a URI for two days.  She states that she has had a productive cough and stuffy nose.  Pt was seen at [facility] for this and sent here for further evaluation as her CBG was 354 and they wanted her to have rule out for DKA, pt is diabetic and she has been taking her insulin  as prescribed.  Pt temp in triage 100.48F and HR 108. Pt states that she has chest and rib soreness with cough

## 2023-07-03 NOTE — Discharge Instructions (Addendum)
 You were seen here for upper respiratory symptoms and high blood sugar.  You have no emergency causes to your symptoms today.  Continue taking your insulin  at home as prescribed, and you can take Tylenol  and Motrin  as needed for fevers and pain.

## 2023-07-04 NOTE — ED Notes (Signed)
 Patient was never brought back to hallway, This nurse did not assess patient, asked charge of patients location, patients location unknown, patient may have eloped after being told she could go.

## 2023-07-04 NOTE — ED Notes (Signed)
 Not in hallway at this time.

## 2023-10-07 ENCOUNTER — Telehealth: Admitting: Physician Assistant

## 2023-10-07 DIAGNOSIS — N760 Acute vaginitis: Secondary | ICD-10-CM | POA: Diagnosis not present

## 2023-10-07 DIAGNOSIS — B9689 Other specified bacterial agents as the cause of diseases classified elsewhere: Secondary | ICD-10-CM | POA: Diagnosis not present

## 2023-10-07 MED ORDER — METRONIDAZOLE 500 MG PO TABS
500.0000 mg | ORAL_TABLET | Freq: Two times a day (BID) | ORAL | 0 refills | Status: AC
Start: 2023-10-07 — End: 2023-10-14

## 2023-10-07 NOTE — Progress Notes (Signed)

## 2023-11-20 ENCOUNTER — Telehealth: Payer: Self-pay

## 2023-11-20 NOTE — Telephone Encounter (Signed)
 RN left voicemail advising pt that the office closes at 12:00 today, we reopen Monday at 8am but if emergent she should report to MAU at [facility].    [place], [place]

## 2023-11-20 NOTE — Telephone Encounter (Signed)
 Pt left voicemail on office nurse line stating she has a new pt pregnancy test visit scheduled on 11/26/23.  She reports she is diabetic and is having some cramping, requesting to be seen sooner.     [place], RN

## 2023-11-23 NOTE — Telephone Encounter (Signed)
Erroneous duplicate encounter.

## 2023-11-26 ENCOUNTER — Encounter: Payer: Self-pay | Admitting: *Deleted

## 2023-11-26 ENCOUNTER — Other Ambulatory Visit (HOSPITAL_COMMUNITY)
Admission: RE | Admit: 2023-11-26 | Discharge: 2023-11-26 | Disposition: A | Discharge status: Home / Self Care | Payer: Self-pay | Source: Ambulatory Visit | Attending: Obstetrics and Gynecology | Admitting: Obstetrics and Gynecology

## 2023-11-26 ENCOUNTER — Ambulatory Visit: Admitting: Obstetrics and Gynecology

## 2023-11-26 ENCOUNTER — Ambulatory Visit

## 2023-11-26 VITALS — BP 114/85 | HR 99 | Ht 67.0 in | Wt 154.3 lb

## 2023-11-26 DIAGNOSIS — Z3201 Encounter for pregnancy test, result positive: Secondary | ICD-10-CM

## 2023-11-26 DIAGNOSIS — N898 Other specified noninflammatory disorders of vagina: Secondary | ICD-10-CM | POA: Insufficient documentation

## 2023-11-26 DIAGNOSIS — Z3200 Encounter for pregnancy test, result unknown: Secondary | ICD-10-CM | POA: Diagnosis present

## 2023-11-26 DIAGNOSIS — O24119 Pre-existing diabetes mellitus, type 2, in pregnancy, unspecified trimester: Secondary | ICD-10-CM | POA: Insufficient documentation

## 2023-11-26 LAB — POCT PREGNANCY, URINE: Preg Test, Ur: POSITIVE — AB

## 2023-11-26 NOTE — Progress Notes (Signed)
 Pt presents for pregnancy test which is positive. She reports sure LMP 10/19/23 which yields EDD 07/25/24, now [redacted]w[redacted]d. Pt states she has been having occasional sharp pain @ RLQ which is not debilitating. She denies vaginal bleeding. Pt was instructed to go to MAU for increased pain or onset of heavy vaginal bleeding. She requested STI testing today - self swab obtained. Pt has previously received prenatal care @ [facility] office in 2018 and will schedule care there for this pregnancy. Nidia Daring in to meet pt.

## 2023-11-26 NOTE — Addendum Note (Signed)
 Addended by: DELORES NIDIA CROME on: 11/26/2023 10:47 AM   Modules accepted: Level of Service

## 2023-11-26 NOTE — Progress Notes (Signed)
  History:  [name]  is a 32 y.o. [id number] who presents to clinic today for pregnancy confirmation. Her LMP is 8/425. She has had two previous vaginal delivers.   Past Medical History:  Diagnosis Date   Gestational diabetes    metformin    Medical history non-contributory    Short cervix    Type 2 diabetes mellitus (HCC)     Past Surgical History:  Procedure Laterality Date   INDUCED ABORTION      The following portions of the patient's history were reviewed and updated as appropriate: allergies, current medications, past family history, past medical history, past social history, past surgical history and problem list.   Review of Systems:  Pertinent items noted in HPI and remainder of comprehensive ROS otherwise negative.  Objective:  Physical Exam BP 114/85   Pulse 99   Ht 5' 7 (1.702 m)   Wt 154 lb 4.8 oz (70 kg)   LMP 10/19/2023 (Exact Date)   BMI 24.17 kg/m  Physical Exam Constitutional:      Appearance: Normal appearance.  Pulmonary:     Effort: Pulmonary effort is normal.  Neurological:     General: No focal deficit present.     Mental Status: She is alert.  Psychiatric:        Mood and Affect: Mood normal.        Behavior: Behavior normal.      Labs and Imaging Results for orders placed or performed in visit on 11/26/23 (from the past 24 hours)  Pregnancy, urine POC     Status: Abnormal   Collection Time: 11/26/23  9:40 AM  Result Value Ref Range   Preg Test, Ur POSITIVE (A) NEGATIVE    No results found.   Assessment & Plan:  1. Possible pregnancy (Primary) Plan to receive care with [facility] precautions  - Pregnancy, urine POC - Cervicovaginal ancillary only  2. Vaginal odor  - Cervicovaginal ancillary only    Approximately 15 minutes of total time was spent with this patient on history taking, coordination of care, education and documentation.   Kelsey Nidia CROME, FNP

## 2023-11-27 LAB — CERVICOVAGINAL ANCILLARY ONLY
Bacterial Vaginitis (gardnerella): NEGATIVE
Candida Glabrata: NEGATIVE
Candida Vaginitis: POSITIVE — AB
Chlamydia: NEGATIVE
Comment: NEGATIVE
Comment: NEGATIVE
Comment: NEGATIVE
Comment: NEGATIVE
Comment: NEGATIVE
Comment: NORMAL
Neisseria Gonorrhea: NEGATIVE
Trichomonas: NEGATIVE

## 2023-11-28 ENCOUNTER — Telehealth: Admitting: Nurse Practitioner

## 2023-11-28 DIAGNOSIS — B3731 Acute candidiasis of vulva and vagina: Secondary | ICD-10-CM | POA: Diagnosis not present

## 2023-11-28 MED ORDER — CLOTRIMAZOLE 1 % VA CREA: 1.0000 | TOPICAL_CREAM | Freq: Every day | VAGINAL | 0 refills | Status: DC

## 2023-11-28 NOTE — Progress Notes (Signed)

## 2023-11-28 NOTE — Progress Notes (Signed)
 I have spent 5 minutes in review of e-visit questionnaire, review and updating patient chart, medical decision making and response to patient.   Claiborne Rigg, NP

## 2023-11-29 ENCOUNTER — Ambulatory Visit: Payer: Self-pay | Admitting: Obstetrics and Gynecology

## 2023-12-08 ENCOUNTER — Inpatient Hospital Stay (HOSPITAL_COMMUNITY)
Admission: AD | Admit: 2023-12-08 | Discharge: 2023-12-09 | Disposition: A | Discharge status: Home / Self Care | Attending: Obstetrics and Gynecology | Admitting: Obstetrics and Gynecology

## 2023-12-08 ENCOUNTER — Encounter (HOSPITAL_COMMUNITY): Payer: Self-pay

## 2023-12-08 DIAGNOSIS — O24111 Pre-existing diabetes mellitus, type 2, in pregnancy, first trimester: Secondary | ICD-10-CM | POA: Diagnosis present

## 2023-12-08 DIAGNOSIS — Z794 Long term (current) use of insulin: Secondary | ICD-10-CM | POA: Insufficient documentation

## 2023-12-08 DIAGNOSIS — T383X6A Underdosing of insulin and oral hypoglycemic [antidiabetic] drugs, initial encounter: Secondary | ICD-10-CM | POA: Diagnosis not present

## 2023-12-08 DIAGNOSIS — E1165 Type 2 diabetes mellitus with hyperglycemia: Secondary | ICD-10-CM

## 2023-12-08 DIAGNOSIS — Z3A01 Less than 8 weeks gestation of pregnancy: Secondary | ICD-10-CM | POA: Diagnosis not present

## 2023-12-08 DIAGNOSIS — Z91128 Patient's intentional underdosing of medication regimen for other reason: Secondary | ICD-10-CM | POA: Insufficient documentation

## 2023-12-08 LAB — GLUCOSE, CAPILLARY
Glucose-Capillary: 332 mg/dL — ABNORMAL HIGH (ref 70–99)
Glucose-Capillary: 212 mg/dL — ABNORMAL HIGH (ref 70–99)
Glucose-Capillary: 167 mg/dL — ABNORMAL HIGH (ref 70–99)

## 2023-12-08 LAB — CBC
HCT: 37.7 % (ref 36.0–46.0)
Hemoglobin: 12.2 g/dL (ref 12.0–15.0)
MCH: 26.3 pg (ref 26.0–34.0)
MCHC: 32.4 g/dL (ref 30.0–36.0)
MCV: 81.3 fL (ref 80.0–100.0)
Platelets: 231 K/uL (ref 150–400)
RBC: 4.64 MIL/uL (ref 3.87–5.11)
RDW: 13.4 % (ref 11.5–15.5)
WBC: 6.3 K/uL (ref 4.0–10.5)
nRBC: 0 % (ref 0.0–0.2)

## 2023-12-08 LAB — COMPREHENSIVE METABOLIC PANEL WITH GFR
ALT: 15 U/L (ref 0–44)
AST: 15 U/L (ref 15–41)
Albumin: 3.9 g/dL (ref 3.5–5.0)
Alkaline Phosphatase: 55 U/L (ref 38–126)
Anion gap: 9 (ref 5–15)
BUN: 11 mg/dL (ref 6–20)
CO2: 23 mmol/L (ref 22–32)
Calcium: 9 mg/dL (ref 8.9–10.3)
Chloride: 101 mmol/L (ref 98–111)
Creatinine, Ser: 0.63 mg/dL (ref 0.44–1.00)
GFR, Estimated: >60 mL/min (ref >60)
Glucose, Bld: 287 mg/dL — ABNORMAL HIGH (ref 70–99)
Potassium: 3.8 mmol/L (ref 3.5–5.1)
Sodium: 133 mmol/L — ABNORMAL LOW (ref 135–145)
Total Bilirubin: 0.7 mg/dL (ref 0.0–1.2)
Total Protein: 7.2 g/dL (ref 6.5–8.1)

## 2023-12-08 LAB — URINALYSIS, ROUTINE W REFLEX MICROSCOPIC
Bilirubin Urine: NEGATIVE
Glucose, UA: >=500 mg/dL — AB
Hgb urine dipstick: NEGATIVE
Ketones, ur: NEGATIVE mg/dL
Nitrite: NEGATIVE
Protein, ur: NEGATIVE mg/dL
Specific Gravity, Urine: 1.039 — ABNORMAL HIGH (ref 1.005–1.030)
pH: 6 (ref 5.0–8.0)

## 2023-12-08 LAB — BLOOD GAS, VENOUS
Acid-Base Excess: 1.3 mmol/L (ref 0.0–2.0)
Bicarbonate: 25.9 mmol/L (ref 20.0–28.0)
O2 Saturation: 42.8 %
Patient temperature: 36.3
pCO2, Ven: 39 mmHg — ABNORMAL LOW (ref 44–60)
pH, Ven: 7.43 (ref 7.25–7.43)
pO2, Ven: <31 mmHg — CL (ref 32–45)

## 2023-12-08 LAB — HCG, QUANTITATIVE, PREGNANCY: hCG, Beta Chain, Quant, S: 18106 m[IU]/mL — ABNORMAL HIGH (ref <5)

## 2023-12-08 LAB — HEMOGLOBIN A1C
Hgb A1c MFr Bld: 10.9 % — ABNORMAL HIGH (ref 4.8–5.6)
Mean Plasma Glucose: 266.13 mg/dL

## 2023-12-08 LAB — BETA-HYDROXYBUTYRIC ACID: Beta-Hydroxybutyric Acid: 0.14 mmol/L (ref 0.05–0.27)

## 2023-12-08 MED ORDER — BLOOD GLUCOSE MONITORING SUPPL DEVI: 1.0000 | Freq: Three times a day (TID) | 0 refills | Status: AC

## 2023-12-08 MED ORDER — HUMULIN N KWIKPEN 100 UNIT/ML ~~LOC~~ SUPN: 5.0000 [IU] | PEN_INJECTOR | Freq: Three times a day (TID) | SUBCUTANEOUS | 11 refills | Status: DC

## 2023-12-08 MED ORDER — INSULIN GLARGINE 100 UNIT/ML ~~LOC~~ SOLN
20.0000 [IU] | Freq: Once | SUBCUTANEOUS | Status: AC
  Administered 2023-12-08: 20 [IU] via SUBCUTANEOUS
  Filled 2023-12-08: qty 0.2

## 2023-12-08 MED ORDER — INSULIN GLARGINE 100 UNIT/ML SOLOSTAR PEN: 20.0000 [IU] | PEN_INJECTOR | Freq: Every day | SUBCUTANEOUS | 12 refills | Status: DC

## 2023-12-08 MED ORDER — BLOOD GLUCOSE TEST VI STRP: 1.0000 | ORAL_STRIP | Freq: Four times a day (QID) | 12 refills | Status: AC

## 2023-12-08 MED ORDER — BD PEN NEEDLE NANO 2ND GEN 32G X 4 MM MISC: 1.0000 [IU] | Freq: Every day | 12 refills | Status: AC

## 2023-12-08 MED ORDER — LANCETS MISC. MISC
1.0000 | Freq: Three times a day (TID) | 0 refills | Status: AC
Start: 2023-12-08 — End: 2024-01-07

## 2023-12-08 MED ORDER — SODIUM CHLORIDE 0.9 % IV BOLUS
1000.0000 mL | Freq: Once | INTRAVENOUS | Status: AC
  Administered 2023-12-08: 1000 mL via INTRAVENOUS

## 2023-12-08 MED ORDER — INSULIN ASPART 100 UNIT/ML IJ SOLN
10.0000 [IU] | Freq: Once | INTRAMUSCULAR | Status: AC
  Administered 2023-12-08: 10 [IU] via SUBCUTANEOUS

## 2023-12-08 MED ORDER — LANCET DEVICE MISC: 1.0000 | Freq: Three times a day (TID) | 0 refills | Status: AC

## 2023-12-08 NOTE — MAU Note (Signed)
..  Kelsey Jones  is a 33 y.o. at [redacted]w[redacted]d here in MAU reporting: Is a type 2 diabetic insulin  dependent. She ran out of her insulin  needles and strips 2 weeks ago. She did find a needle 2 days ago and gave herself 12 units of lantus . She reports her legs are tingling and she has polydipsia, polyuria, and polyphagia. She also reports she's very tired during the day and up most of the night. Denies VB. Is having some lower abdominal cramping that started about 2-3 weeks ago.   CBG-332 in triage  Pain score: 6 Vitals:   12/08/23 1752  BP: 104/71  Pulse: 78  Resp: 14  Temp: 98.5 F (36.9 C)  SpO2: 100%     FHT:n/a Lab orders placed from triage:   UA, CBG

## 2023-12-08 NOTE — Discharge Instructions (Signed)
 You were seen for elevated blood sugars  Your sugar was quite elevated here at the MAU at 332 initially. You were given fast acting insulin  and also Lantus . Your diabetes is not well controlled right now and we would like this to improve to help make your pregnancy healthier.   You will need to start using insulin  regularly to help control your sugars  You will take Lantus  20 unit every night at bedtime You will take Humalog  5 units will every meal

## 2023-12-08 NOTE — MAU Provider Note (Signed)
 History     CSN: [id number]  Arrival date and time: 12/08/23 1730   Event Date/Time   First Provider Initiated Contact with Patient 12/08/23 1902      Chief Complaint  Patient presents with   Hyperglycemia   32 y.o. [id number] [redacted]w[redacted]d here with complaints of dehydration. Patient has T2Dm and reports not taking insulin  for ~2 weeks due to not having needles. She did find a needle and took 12u Lantus  2 days ago. She reports feeling dizzy and lightheaded. Home BG are typically 300-450s. Associated weakness. Denies fevers, chills. Reports normal nausea associated with pregnancy. Does not have confirmed viable pregnancy and has confirmation of pregnancy on 9/11. Reports some mild cramping in lower abdomen.  denies LOF, VB, contractions, vaginal discharge.  Hyperglycemia Pertinent negatives include no abdominal pain, chest pain, chills, congestion, coughing, fever, nausea, rash, sore throat or vomiting.    OB History     Gravida  5   Para  2   Term  2   Preterm  0   AB  1   Living  2      SAB  0   IAB  1   Ectopic  0   Multiple  0   Live Births  2           Past Medical History:  Diagnosis Date   Gestational diabetes    metformin    Medical history non-contributory    Short cervix    Type 2 diabetes mellitus (HCC)     Past Surgical History:  Procedure Laterality Date   INDUCED ABORTION      Family History  Problem Relation Age of Onset   Diabetes Mother    Cancer Maternal Grandmother    Diabetes Maternal Grandmother    Cancer Maternal Grandfather     Social History   Tobacco Use   Smoking status: Former    Types: Cigars   Smokeless tobacco: Never  Vaping Use   Vaping status: Never Used  Substance Use Topics   Alcohol use: No    Comment: Occasionally   Drug use: No    Allergies: No Known Allergies  Medications Prior to Admission  Medication Sig Dispense Refill Last Dose/Taking   clotrimazole  (GYNE-LOTRIMIN ) 1 % vaginal cream Place 1  Applicatorful vaginally at bedtime. For 5 days 45 g 0 12/07/2023   Prenatal Vit-Fe Fumarate-FA (PREPLUS) 27-1 MG TABS Take 1 tablet by mouth daily. 30 tablet 13 12/07/2023   acetaminophen  (TYLENOL ) 500 MG tablet Take 500 mg by mouth every 6 (six) hours as needed for headache (cramps).      blood glucose meter kit and supplies KIT Dispense based on patient and insurance preference. Use up to four times daily as directed. (FOR ICD-9 250.00, 250.01). 1 each 0    blood glucose meter kit and supplies Dispense based on patient and insurance preference. Use up to four times daily as directed. (FOR ICD-10 E10.9, E11.9). 1 each 0    hydrocortisone  2.5 % ointment Apply topically 2 (two) times daily. (Patient not taking: Reported on 11/26/2023) 30 g 0    insulin  glargine (LANTUS ) 100 UNIT/ML injection Inject 10 Units into the skin at bedtime.   12/06/2023   insulin  glargine (LANTUS ) 100 UNIT/ML injection Inject 0.1 mLs (10 Units total) into the skin at bedtime. 3 mL 2     Review of Systems  Constitutional:  Negative for chills and fever.  HENT:  Negative for congestion and sore throat.   Eyes:  Negative  for pain and visual disturbance.  Respiratory:  Negative for cough, chest tightness and shortness of breath.   Cardiovascular:  Negative for chest pain.  Gastrointestinal:  Negative for abdominal pain, diarrhea, nausea and vomiting.  Endocrine: Negative for cold intolerance and heat intolerance.  Genitourinary:  Negative for dysuria and flank pain.  Musculoskeletal:  Negative for back pain.  Skin:  Negative for rash.  Allergic/Immunologic: Negative for food allergies.  Neurological:  Negative for dizziness and light-headedness.  Psychiatric/Behavioral:  Negative for agitation.    Physical Exam   Blood pressure 104/71, pulse 78, temperature 98.5 F (36.9 C), resp. rate 14, weight 70.6 kg, last menstrual period 10/19/2023, SpO2 100%, unknown if currently breastfeeding.  Physical Exam Vitals and nursing  note reviewed.  Constitutional:      General: She is not in acute distress.    Appearance: She is well-developed.  HENT:     Head: Normocephalic and atraumatic.     Mouth/Throat:     Mouth: Mucous membranes are dry.  Eyes:     General: No scleral icterus.    Conjunctiva/sclera: Conjunctivae normal.  Cardiovascular:     Rate and Rhythm: Normal rate.  Pulmonary:     Effort: Pulmonary effort is normal.  Chest:     Chest wall: No tenderness.  Abdominal:     Palpations: Abdomen is soft.     Tenderness: There is no abdominal tenderness. There is no guarding or rebound.  Genitourinary:    Vagina: Normal.  Musculoskeletal:        General: Normal range of motion.     Cervical back: Normal range of motion and neck supple.  Skin:    General: Skin is warm and dry.     Findings: No rash.  Neurological:     Mental Status: She is alert and oriented to person, place, and time.     MAU Course  Procedures  MDM- high  Hyperglycemia-- concern for DKA/HHS given initial BG.  Given Aspart 10u SQ BG 332--> 212 with insulin --> 167 1L NS bolus recieved Lantus  20 units given  PO challenge in MAU, passed   Results for orders placed or performed during the hospital encounter of 12/08/23 (from the past 24 hours)  Urinalysis, Routine w reflex microscopic -Urine, Clean Catch     Status: Abnormal   Collection Time: 12/08/23  6:00 PM  Result Value Ref Range   Color, Urine YELLOW YELLOW   APPearance HAZY (A) CLEAR   Specific Gravity, Urine 1.039 (H) 1.005 - 1.030   pH 6.0 5.0 - 8.0   Glucose, UA >=500 (A) NEGATIVE mg/dL   Hgb urine dipstick NEGATIVE NEGATIVE   Bilirubin Urine NEGATIVE NEGATIVE   Ketones, ur NEGATIVE NEGATIVE mg/dL   Protein, ur NEGATIVE NEGATIVE mg/dL   Nitrite NEGATIVE NEGATIVE   Leukocytes,Ua TRACE (A) NEGATIVE   RBC / HPF 0-5 0 - 5 RBC/hpf   WBC, UA 21-50 0 - 5 WBC/hpf   Bacteria, UA RARE (A) NONE SEEN   Squamous Epithelial / HPF 11-20 0 - 5 /HPF   Mucus PRESENT    Glucose, capillary     Status: Abnormal   Collection Time: 12/08/23  6:00 PM  Result Value Ref Range   Glucose-Capillary 332 (H) 70 - 99 mg/dL  Comprehensive metabolic panel     Status: Abnormal   Collection Time: 12/08/23  6:54 PM  Result Value Ref Range   Sodium 133 (L) 135 - 145 mmol/L   Potassium 3.8 3.5 - 5.1 mmol/L  Chloride 101 98 - 111 mmol/L   CO2 23 22 - 32 mmol/L   Glucose, Bld 287 (H) 70 - 99 mg/dL   BUN 11 6 - 20 mg/dL   Creatinine, Ser 9.36 0.44 - 1.00 mg/dL   Calcium 9.0 8.9 - 89.6 mg/dL   Total Protein 7.2 6.5 - 8.1 g/dL   Albumin 3.9 3.5 - 5.0 g/dL   AST 15 15 - 41 U/L   ALT 15 0 - 44 U/L   Alkaline Phosphatase 55 38 - 126 U/L   Total Bilirubin 0.7 0.0 - 1.2 mg/dL   GFR, Estimated >39 >39 mL/min   Anion gap 9 5 - 15  CBC     Status: None   Collection Time: 12/08/23  6:54 PM  Result Value Ref Range   WBC 6.3 4.0 - 10.5 K/uL   RBC 4.64 3.87 - 5.11 MIL/uL   Hemoglobin 12.2 12.0 - 15.0 g/dL   HCT 62.2 63.9 - 53.9 %   MCV 81.3 80.0 - 100.0 fL   MCH 26.3 26.0 - 34.0 pg   MCHC 32.4 30.0 - 36.0 g/dL   RDW 86.5 88.4 - 84.4 %   Platelets 231 150 - 400 K/uL   nRBC 0.0 0.0 - 0.2 %  Beta-hydroxybutyric acid     Status: None   Collection Time: 12/08/23  6:54 PM  Result Value Ref Range   Beta-Hydroxybutyric Acid 0.14 0.05 - 0.27 mmol/L  Hemoglobin A1c     Status: Abnormal   Collection Time: 12/08/23  6:54 PM  Result Value Ref Range   Hgb A1c MFr Bld 10.9 (H) 4.8 - 5.6 %   Mean Plasma Glucose 266.13 mg/dL  hCG, quantitative, pregnancy     Status: Abnormal   Collection Time: 12/08/23  6:54 PM  Result Value Ref Range   hCG, Beta Chain, Quant, S 18,106 (H) <5 mIU/mL  Blood gas, venous     Status: Abnormal   Collection Time: 12/08/23  6:54 PM  Result Value Ref Range   pH, Ven 7.43 7.25 - 7.43   pCO2, Ven 39 (L) 44 - 60 mmHg   pO2, Ven <31 (LL) 32 - 45 mmHg   Bicarbonate 25.9 20.0 - 28.0 mmol/L   Acid-Base Excess 1.3 0.0 - 2.0 mmol/L   O2 Saturation 42.8 %    Patient temperature 36.3    Drawn by FREDRIK GRAVEL, RN   Glucose, capillary     Status: Abnormal   Collection Time: 12/08/23  8:09 PM  Result Value Ref Range   Glucose-Capillary 212 (H) 70 - 99 mg/dL  Glucose, capillary     Status: Abnormal   Collection Time: 12/08/23 10:02 PM  Result Value Ref Range   Glucose-Capillary 167 (H) 70 - 99 mg/dL     Assessment and Plan   1. Hyperglycemia due to diabetes mellitus (HCC)   2. [redacted] weeks gestation of pregnancy   -Discharged stable condition -Given instructions for starting insulin , rx for lantus  solostar pen and humalog  pen order. Additionally ordered glucometer supplies.  -message sent to [facility] to get patient scheduled for viability US   Suzen Maryan Masters 12/08/2023, 8:39 PM   Allergies as of 12/08/2023   No Known Allergies      Medication List     STOP taking these medications    blood glucose meter kit and supplies   blood glucose meter kit and supplies Kit   insulin  glargine 100 UNIT/ML injection Commonly known as: LANTUS  Replaced by: insulin  glargine 100 UNIT/ML Solostar  Pen       TAKE these medications    acetaminophen  500 MG tablet Commonly known as: TYLENOL  Take 500 mg by mouth every 6 (six) hours as needed for headache (cramps).   BD Pen Needle Nano 2nd Gen 32G X 4 MM Misc Generic drug: Insulin  Pen Needle 1 Units by Does not apply route daily.   Blood Glucose Monitoring Suppl Devi 1 each by Does not apply route in the morning, at noon, and at bedtime. May substitute to any manufacturer covered by patient's insurance.   BLOOD GLUCOSE TEST STRIPS Strp 1 each by Does not apply route in the morning, at noon, in the evening, and at bedtime. May substitute to any manufacturer covered by patient's insurance.   clotrimazole  1 % vaginal cream Commonly known as: GYNE-LOTRIMIN  Place 1 Applicatorful vaginally at bedtime. For 5 days   HumuLIN  N KwikPen 100 UNIT/ML KwikPen Generic drug: Insulin  NPH (Human)  (Isophane) Inject 5 Units into the skin with breakfast, with lunch, and with evening meal.   hydrocortisone  2.5 % ointment Apply topically 2 (two) times daily.   insulin  glargine 100 UNIT/ML Solostar Pen Commonly known as: LANTUS  Inject 20 Units into the skin daily. Replaces: insulin  glargine 100 UNIT/ML injection   Lancet Device Misc 1 each by Does not apply route in the morning, at noon, and at bedtime. May substitute to any manufacturer covered by patient's insurance.   Lancets Misc. Misc 1 each by Does not apply route in the morning, at noon, and at bedtime. May substitute to any manufacturer covered by patient's insurance.   PrePLUS 27-1 MG Tabs Take 1 tablet by mouth daily.

## 2023-12-08 NOTE — MAU Note (Signed)
 CRITICAL VALUE STICKER  CRITICAL VALUE: PO2 less than 31  RECEIVER (on-site recipient of call): Silvano Quest RN   DATE & TIME NOTIFIED: 12/08/23 1918  MD NOTIFIED: Dr. Eldonna  TIME OF NOTIFICATION: [phone]

## 2023-12-09 ENCOUNTER — Telehealth: Admitting: Physician Assistant

## 2023-12-09 DIAGNOSIS — R3989 Other symptoms and signs involving the genitourinary system: Secondary | ICD-10-CM

## 2023-12-09 MED ORDER — CEPHALEXIN 500 MG PO CAPS: 500.0000 mg | ORAL_CAPSULE | Freq: Two times a day (BID) | ORAL | 0 refills | Status: DC

## 2023-12-09 NOTE — Progress Notes (Signed)
 E-Visit for Urinary Problems  We are sorry that you are not feeling well.  Here is how we plan to help!  Based on what you shared with me it looks like you most likely have a simple urinary tract infection.  A UTI (Urinary Tract Infection) is a bacterial infection of the bladder.  Most cases of urinary tract infections are simple to treat but a key part of your care is to encourage you to drink plenty of fluids and watch your symptoms carefully.  I have prescribed Keflex  500 mg twice a day for 7 days.  Your symptoms should gradually improve. Call us  if the burning in your urine worsens, you develop worsening fever, back pain or pelvic pain or if your symptoms do not resolve after completing the antibiotic.  Urinary tract infections can be prevented by drinking plenty of water to keep your body hydrated.  Also be sure when you wipe, wipe from front to back and don't hold it in!  If possible, empty your bladder every 4 hours.  HOME CARE Drink plenty of fluids Compete the full course of the antibiotics even if the symptoms resolve Remember, when you need to go.go. Holding in your urine can increase the likelihood of getting a UTI! GET HELP RIGHT AWAY IF: You cannot urinate You get a high fever Worsening back pain occurs You see blood in your urine You feel sick to your stomach or throw up You feel like you are going to pass out  MAKE SURE YOU  Understand these instructions. Will watch your condition. Will get help right away if you are not doing well or get worse.   Thank you for choosing an e-visit.  Your e-visit answers were reviewed by a board certified advanced clinical practitioner to complete your personal care plan. Depending upon the condition, your plan could have included both over the counter or prescription medications.  Please review your pharmacy choice. Make sure the pharmacy is open so you can pick up prescription now. If there is a problem, you may contact your  provider through [employer] and have the prescription routed to another pharmacy.  Your safety is important to us . If you have drug allergies check your prescription carefully.   For the next 24 hours you can use MyChart to ask questions about today's visit, request a non-urgent call back, or ask for a work or school excuse. You will get an email in the next two days asking about your experience. I hope that your e-visit has been valuable and will speed your recovery.  I have spent 5 minutes in review of e-visit questionnaire, review and updating patient chart, medical decision making and response to patient.   Delon CHRISTELLA Dickinson, PA-C

## 2023-12-14 ENCOUNTER — Ambulatory Visit (INDEPENDENT_AMBULATORY_CARE_PROVIDER_SITE_OTHER): Admitting: *Deleted

## 2023-12-14 ENCOUNTER — Other Ambulatory Visit (INDEPENDENT_AMBULATORY_CARE_PROVIDER_SITE_OTHER): Payer: Self-pay

## 2023-12-14 VITALS — BP 112/77 | HR 79 | Wt 156.6 lb

## 2023-12-14 DIAGNOSIS — Z3A08 8 weeks gestation of pregnancy: Secondary | ICD-10-CM

## 2023-12-14 DIAGNOSIS — O3680X0 Pregnancy with inconclusive fetal viability, not applicable or unspecified: Secondary | ICD-10-CM

## 2023-12-14 DIAGNOSIS — O0991 Supervision of high risk pregnancy, unspecified, first trimester: Secondary | ICD-10-CM

## 2023-12-14 DIAGNOSIS — O0990 Supervision of high risk pregnancy, unspecified, unspecified trimester: Secondary | ICD-10-CM | POA: Insufficient documentation

## 2023-12-14 DIAGNOSIS — O24119 Pre-existing diabetes mellitus, type 2, in pregnancy, unspecified trimester: Secondary | ICD-10-CM

## 2023-12-14 MED ORDER — DEXCOM G7 SENSOR MISC: 1.0000 | 7 refills | Status: AC | PRN

## 2023-12-14 MED ORDER — BLOOD PRESSURE KIT DEVI: 1.0000 | 0 refills | Status: AC

## 2023-12-14 NOTE — Progress Notes (Signed)
 Pt has uncontrolled T2DM w/ recent Hgb A1C=10.9. Pt has not picked up blood sugar testing supplies that were RX'd in MAU 12/08/23 and is not using Lantus  at recommended dose. Discussed with Dr. Alger. Plan to start pt with trial of Dexcom 7 and send RX for same. Pt advised of importance of glucose control in first trimester and of need for FBS<90 and PP BS<120. Pt verbalized understanding.

## 2023-12-14 NOTE — Progress Notes (Signed)
 New OB Intake  I connected with Kelsey Jones   on 12/14/23 at 10:15 AM EDT by In Person Visit and verified that I am speaking with the correct person using two identifiers. Nurse is located at [facility] and pt is located at [facility].  I discussed the limitations, risks, security and privacy concerns of performing an evaluation and management service by telephone and the availability of in person appointments. I also discussed with the patient that there may be a patient responsible charge related to this service. The patient expressed understanding and agreed to proceed.  I explained I am completing New OB Intake today. We discussed EDD of 07/25/24 based on LMP of 10/19/23. Pt is [id number]. I reviewed her allergies, medications and Medical/Surgical/OB history.    Patient Active Problem List   Diagnosis Date Noted   Type 2 diabetes mellitus affecting pregnancy, antepartum 11/26/2023   History of gestational diabetes 08/03/2016   Medication controlled gestational diabetes mellitus in third trimester 05/16/2016   History of prior pregnancy with short cervix, currently pregnant 04/04/2016   Prior pregnancy complicated by short cervix, antepartum 01/09/2016   ASCUS pap smear but negative high-risk HPV on 10/16/2015.    10/23/2015     Concerns addressed today  Delivery Plans Plans to deliver at [facility]. Discussed the nature of our practice with multiple providers including residents and students as well as female and female providers. Due to the size of the practice, the delivering provider may not be the same as those providing prenatal care.   Patient is interested in water birth.  MyChart/Babyscripts MyChart access verified. I explained pt will have some visits in office and some virtually. Babyscripts instructions given and order placed. Patient verifies receipt of registration text/e-mail. Account successfully created and app downloaded. If patient is a candidate for Optimized scheduling, add  to sticky note.   Blood Pressure Cuff/Weight Scale Blood pressure cuff ordered for patient to pick-up from [employer]. Explained after first prenatal appt pt will check weekly and document in Babyscripts. Patient does not have weight scale; patient may purchase if they desire to track weight weekly in Babyscripts.  Anatomy US  Explained first scheduled US  will be around 19 weeks. Anatomy US  scheduled for TBD at TBD.  Is patient a candidate for Babyscripts Optimization? No, due to Risk Factors   First visit review I reviewed new OB appt with patient. Explained pt will be seen by Dr. Eveline at first visit. Discussed Kelsey Jones genetic screening with patient. Requests Panor[facility] and [facility].. Routine prenatal labs OB Urine collected at today's visit. Initial prenatal labs deferred to New OB appt.   Last Pap No results found for: DIAGPAP  Kelsey CHRISTELLA Ober, RN 12/14/2023  11:01 AM

## 2023-12-14 NOTE — Patient Instructions (Signed)

## 2023-12-16 ENCOUNTER — Encounter

## 2023-12-18 ENCOUNTER — Inpatient Hospital Stay (HOSPITAL_COMMUNITY)
Admission: AD | Admit: 2023-12-18 | Discharge: 2023-12-18 | Disposition: A | Discharge status: Home / Self Care | Attending: Family Medicine | Admitting: Family Medicine

## 2023-12-18 ENCOUNTER — Encounter (HOSPITAL_COMMUNITY): Payer: Self-pay | Admitting: Family Medicine

## 2023-12-18 DIAGNOSIS — E1165 Type 2 diabetes mellitus with hyperglycemia: Secondary | ICD-10-CM

## 2023-12-18 DIAGNOSIS — Z7984 Long term (current) use of oral hypoglycemic drugs: Secondary | ICD-10-CM | POA: Diagnosis not present

## 2023-12-18 DIAGNOSIS — Z794 Long term (current) use of insulin: Secondary | ICD-10-CM | POA: Insufficient documentation

## 2023-12-18 DIAGNOSIS — O219 Vomiting of pregnancy, unspecified: Secondary | ICD-10-CM | POA: Diagnosis not present

## 2023-12-18 DIAGNOSIS — R112 Nausea with vomiting, unspecified: Secondary | ICD-10-CM

## 2023-12-18 DIAGNOSIS — Z3A08 8 weeks gestation of pregnancy: Secondary | ICD-10-CM

## 2023-12-18 DIAGNOSIS — O24111 Pre-existing diabetes mellitus, type 2, in pregnancy, first trimester: Secondary | ICD-10-CM

## 2023-12-18 DIAGNOSIS — O24119 Pre-existing diabetes mellitus, type 2, in pregnancy, unspecified trimester: Secondary | ICD-10-CM

## 2023-12-18 LAB — CULTURE, OB URINE

## 2023-12-18 LAB — COMPREHENSIVE METABOLIC PANEL WITH GFR
ALT: 17 U/L (ref 0–44)
AST: 15 U/L (ref 15–41)
Albumin: 4.2 g/dL (ref 3.5–5.0)
Alkaline Phosphatase: 56 U/L (ref 38–126)
Anion gap: 13 (ref 5–15)
BUN: 9 mg/dL (ref 6–20)
CO2: 25 mmol/L (ref 22–32)
Calcium: 9.5 mg/dL (ref 8.9–10.3)
Chloride: 99 mmol/L (ref 98–111)
Creatinine, Ser: 0.67 mg/dL (ref 0.44–1.00)
GFR, Estimated: >60 mL/min (ref >60)
Glucose, Bld: 162 mg/dL — ABNORMAL HIGH (ref 70–99)
Potassium: 3.5 mmol/L (ref 3.5–5.1)
Sodium: 137 mmol/L (ref 135–145)
Total Bilirubin: 0.7 mg/dL (ref 0.0–1.2)
Total Protein: 7.9 g/dL (ref 6.5–8.1)

## 2023-12-18 LAB — CBC WITH DIFFERENTIAL/PLATELET
Abs Immature Granulocytes: 0.03 K/uL (ref 0.00–0.07)
Basophils Absolute: 0 K/uL (ref 0.0–0.1)
Basophils Relative: 0 %
Eosinophils Absolute: 0 K/uL (ref 0.0–0.5)
Eosinophils Relative: 0 %
HCT: 39.9 % (ref 36.0–46.0)
Hemoglobin: 13 g/dL (ref 12.0–15.0)
Immature Granulocytes: 0 %
Lymphocytes Relative: 32 %
Lymphs Abs: 2.5 K/uL (ref 0.7–4.0)
MCH: 26.5 pg (ref 26.0–34.0)
MCHC: 32.6 g/dL (ref 30.0–36.0)
MCV: 81.4 fL (ref 80.0–100.0)
Monocytes Absolute: 0.6 K/uL (ref 0.1–1.0)
Monocytes Relative: 7 %
Neutro Abs: 4.5 K/uL (ref 1.7–7.7)
Neutrophils Relative %: 61 %
Platelets: 270 K/uL (ref 150–400)
RBC: 4.9 MIL/uL (ref 3.87–5.11)
RDW: 13.6 % (ref 11.5–15.5)
WBC: 7.6 K/uL (ref 4.0–10.5)
nRBC: 0 % (ref 0.0–0.2)

## 2023-12-18 LAB — GLUCOSE, CAPILLARY: Glucose-Capillary: 94 mg/dL (ref 70–99)

## 2023-12-18 LAB — URINE CULTURE, OB REFLEX

## 2023-12-18 MED ORDER — INSULIN GLARGINE 100 UNIT/ML SOLOSTAR PEN: 20.0000 [IU] | PEN_INJECTOR | Freq: Every day | SUBCUTANEOUS | 2 refills | Status: AC

## 2023-12-18 MED ORDER — INSULIN ASPART 100 UNIT/ML IJ SOLN
10.0000 [IU] | Freq: Once | INTRAMUSCULAR | Status: AC
  Administered 2023-12-18: 10 [IU] via SUBCUTANEOUS

## 2023-12-18 MED ORDER — SODIUM CHLORIDE 0.9 % IV SOLN
8.0000 mg | Freq: Once | INTRAVENOUS | Status: AC
  Administered 2023-12-18: 8 mg via INTRAVENOUS
  Filled 2023-12-18: qty 4

## 2023-12-18 MED ORDER — FAMOTIDINE 20 MG PO TABS: 20.0000 mg | ORAL_TABLET | Freq: Two times a day (BID) | ORAL | 3 refills | Status: DC

## 2023-12-18 MED ORDER — ONDANSETRON HCL 4 MG PO TABS: 4.0000 mg | ORAL_TABLET | Freq: Three times a day (TID) | ORAL | 1 refills | Status: DC | PRN

## 2023-12-18 MED ORDER — INSULIN GLARGINE 100 UNIT/ML ~~LOC~~ SOLN
20.0000 [IU] | Freq: Once | SUBCUTANEOUS | Status: AC
  Administered 2023-12-18: 20 [IU] via SUBCUTANEOUS
  Filled 2023-12-18: qty 0.2

## 2023-12-18 MED ORDER — LACTATED RINGERS IV BOLUS
1000.0000 mL | Freq: Once | INTRAVENOUS | Status: AC
  Administered 2023-12-18: 1000 mL via INTRAVENOUS

## 2023-12-18 MED ORDER — FAMOTIDINE IN NACL 20-0.9 MG/50ML-% IV SOLN
20.0000 mg | Freq: Once | INTRAVENOUS | Status: AC
  Administered 2023-12-18: 20 mg via INTRAVENOUS
  Filled 2023-12-18: qty 50

## 2023-12-18 MED ORDER — METFORMIN HCL 500 MG PO TABS: 500.0000 mg | ORAL_TABLET | Freq: Two times a day (BID) | ORAL | 1 refills | Status: DC

## 2023-12-18 NOTE — MAU Note (Signed)
.  Wm Fruchter Krabill  is a 33 y.o. at [redacted]w[redacted]d here in MAU reporting: Pt stated she's vomited 4x in the last 30 minutes. She says this never happens. Pt is also a DM2-insulin  controlled. Her blood sugar was 330 then 267 around 1630, per pt. She stated she gave herself Insulin  Aspart 14units and a Humulin  NPH dose that she uses am/pm. Pt reporting chest pain/tightness at this time, unsure if it is related to anxiety/panic attack but that she began crying at work and her chest became tight. Denies VB, LOF, and cramping.  LMP: na Onset of complaint: 1700 Pain score: 0/10 Vitals:   12/18/23 1732  Resp: 18     FHT: na  Lab orders placed from triage: UA

## 2023-12-18 NOTE — MAU Provider Note (Signed)
 History     CSN: [id number]  Arrival date and time: 12/18/23 1658   Event Date/Time   First Provider Initiated Contact with Patient 12/18/23 1819      Chief Complaint  Patient presents with   Emesis   Blood Sugar Problem   HPI  Kelsey Jones  is a 33 y.o. female [phone] @ [redacted]w[redacted]d here in MAU with complaints of elevated BS readings and N/V. She has a history of Type 2 DM, was seen recently in MAU with similar complaints and had changes made to her medication regimen.    Has Dexcom in place, data sharing set up. 15% of BS in target range with 38% of BS very high.  BS on arrival is 345  Current diabetes management:  NPH: 5 units before breakfast, lunch and dinner Novolog  (Aspart) 8-14 units, taking this after meals.   Rx for Lantus  but the patient did not know about this and did not fill this.   Dexcom reviewed upon arrival with confirmation of very high BS's in the 300's with an occasional drop of BS to the 40's/50's.   OB History     Gravida  5   Para  2   Term  2   Preterm  0   AB  2   Living  2      SAB  1   IAB  1   Ectopic  0   Multiple  0   Live Births  2           Past Medical History:  Diagnosis Date   Gestational diabetes    metformin    Medical history non-contributory    Short cervix    Type 2 diabetes mellitus (HCC)     Past Surgical History:  Procedure Laterality Date   INDUCED ABORTION      Family History  Problem Relation Age of Onset   Hypertension Mother    Diabetes Mother    Breast cancer Mother    Cancer Maternal Grandmother    Diabetes Maternal Grandmother    Stroke Maternal Grandmother    Cancer Maternal Grandfather     Social History   Tobacco Use   Smoking status: Former    Types: Cigars   Smokeless tobacco: Never  Vaping Use   Vaping status: Never Used  Substance Use Topics   Alcohol use: No    Comment: Occasionally   Drug use: No    Allergies: No Known Allergies  Medications Prior to  Admission  Medication Sig Dispense Refill Last Dose/Taking   Insulin  NPH, Human,, Isophane, (HUMULIN  N KWIKPEN) 100 UNIT/ML Kiwkpen Inject 5 Units into the skin with breakfast, with lunch, and with evening meal. 3 mL 11 12/18/2023   Prenatal Vit-Fe Fumarate-FA (PREPLUS) 27-1 MG TABS Take 1 tablet by mouth daily. 30 tablet 13 12/17/2023   acetaminophen  (TYLENOL ) 500 MG tablet Take 500 mg by mouth every 6 (six) hours as needed for headache (cramps).      Blood Glucose Monitoring Suppl DEVI 1 each by Does not apply route in the morning, at noon, and at bedtime. May substitute to any manufacturer covered by patient's insurance. 1 each 0    Blood Pressure Monitoring (BLOOD PRESSURE KIT) DEVI 1 Device by Does not apply route once a week. 1 each 0    cephALEXin  (KEFLEX ) 500 MG capsule Take 1 capsule (500 mg total) by mouth 2 (two) times daily. 14 capsule 0    clotrimazole  (GYNE-LOTRIMIN ) 1 % vaginal cream Place 1  Applicatorful vaginally at bedtime. For 5 days (Patient not taking: Reported on 12/14/2023) 45 g 0    Continuous Glucose Sensor (DEXCOM G7 SENSOR) MISC 1 Device by Does not apply route as needed for up to 10 days (Change sensor every 10 days). 3 each 7    Glucose Blood (BLOOD GLUCOSE TEST STRIPS) STRP 1 each by Does not apply route in the morning, at noon, in the evening, and at bedtime. May substitute to any manufacturer covered by patient's insurance. 120 strip 12    hydrocortisone  2.5 % ointment Apply topically 2 (two) times daily. (Patient not taking: Reported on 11/26/2023) 30 g 0    insulin  glargine (LANTUS ) 100 UNIT/ML Solostar Pen Inject 20 Units into the skin daily. (Patient taking differently: Inject 20 Units into the skin daily.) 6 mL 12    Insulin  Pen Needle (BD PEN NEEDLE NANO 2ND GEN) 32G X 4 MM MISC 1 Units by Does not apply route daily. 30 each 12    Lancet Device MISC 1 each by Does not apply route in the morning, at noon, and at bedtime. May substitute to any manufacturer covered by  patient's insurance. 1 each 0    Lancets Misc. MISC 1 each by Does not apply route in the morning, at noon, and at bedtime. May substitute to any manufacturer covered by patient's insurance. 100 each 0    Results for orders placed or performed during the hospital encounter of 12/18/23 (from the past 48 hours)  CBC with Differential/Platelet     Status: None   Collection Time: 12/18/23  6:05 PM  Result Value Ref Range   WBC 7.6 4.0 - 10.5 K/uL   RBC 4.90 3.87 - 5.11 MIL/uL   Hemoglobin 13.0 12.0 - 15.0 g/dL   HCT 60.0 63.9 - 53.9 %   MCV 81.4 80.0 - 100.0 fL   MCH 26.5 26.0 - 34.0 pg   MCHC 32.6 30.0 - 36.0 g/dL   RDW 86.3 88.4 - 84.4 %   Platelets 270 150 - 400 K/uL   nRBC 0.0 0.0 - 0.2 %   Neutrophils Relative % 61 %   Neutro Abs 4.5 1.7 - 7.7 K/uL   Lymphocytes Relative 32 %   Lymphs Abs 2.5 0.7 - 4.0 K/uL   Monocytes Relative 7 %   Monocytes Absolute 0.6 0.1 - 1.0 K/uL   Eosinophils Relative 0 %   Eosinophils Absolute 0.0 0.0 - 0.5 K/uL   Basophils Relative 0 %   Basophils Absolute 0.0 0.0 - 0.1 K/uL   Immature Granulocytes 0 %   Abs Immature Granulocytes 0.03 0.00 - 0.07 K/uL    Comment: Performed at [facility], [address], [place], [place] [address]  Comprehensive metabolic panel     Status: Abnormal   Collection Time: 12/18/23  6:05 PM  Result Value Ref Range   Sodium 137 135 - 145 mmol/L   Potassium 3.5 3.5 - 5.1 mmol/L   Chloride 99 98 - 111 mmol/L   CO2 25 22 - 32 mmol/L   Glucose, Bld 162 (H) 70 - 99 mg/dL    Comment: Glucose reference range applies only to samples taken after fasting for at least 8 hours.   BUN 9 6 - 20 mg/dL   Creatinine, Ser 9.32 0.44 - 1.00 mg/dL   Calcium 9.5 8.9 - 89.6 mg/dL   Total Protein 7.9 6.5 - 8.1 g/dL   Albumin 4.2 3.5 - 5.0 g/dL   AST 15 15 - 41  U/L   ALT 17 0 - 44 U/L   Alkaline Phosphatase 56 38 - 126 U/L   Total Bilirubin 0.7 0.0 - 1.2 mg/dL   GFR, Estimated >39 >39 mL/min    Comment: (NOTE) Calculated using  the CKD-EPI Creatinine Equation (2021)    Anion gap 13 5 - 15    Comment: Performed at [facility], [address], [place], [place] [address]  Glucose, capillary     Status: None   Collection Time: 12/18/23  8:00 PM  Result Value Ref Range   Glucose-Capillary 94 70 - 99 mg/dL    Comment: Glucose reference range applies only to samples taken after fasting for at least 8 hours.     Review of Systems  Gastrointestinal:  Positive for nausea and vomiting. Negative for abdominal pain.   Physical Exam   Resp. rate 18, height 5' 7 (1.702 m), weight 70.3 kg, last menstrual period 10/19/2023, unknown if currently breastfeeding.  Physical Exam Constitutional:      General: She is not in acute distress.    Appearance: Normal appearance. She is ill-appearing.  Neurological:     Mental Status: She is alert and oriented to person, place, and time.  Psychiatric:        Behavior: Behavior normal.    MAU Course  Procedures  MDM  Patient actively vomiting in MAU.  LR bolux 2  Zofran  8 mg IV along with Pepcid IV. CBC and CMP reassuring without signs of DKA.  10 units of Novolog  given 15 minutes before a snack here in MAU: cheese, peanut butter, and crackers. Sugars down to 95 following Novolog  and snack.  Patient then tolerated a Sandwich and other snacks.   Discussed patient with Dr. Jayne. Discussed diabetes regimen.  She received 2 liters of IV fluids, Zofran  and Pepcid. She is feeling better and tolerate food. Dr.Eure recommended initiation of metformin  and patient voiced that she is unable to tolerate Metformin  due to GI upset in the past.   Will hold Novalog for now and start Lantus  20 units QPM and Lantus  10 units QPM. I will be in touch with her in the next few days to review BS and make adjustments to medications. Will likely add in Novolog  with meals.   Patient is agreeable with plan of care.   Assessment and Plan   A:  1. Type 2 diabetes mellitus affecting  pregnancy, antepartum   2. Uncontrolled type 2 diabetes mellitus with hyperglycemia (HCC)   3. Nausea and vomiting, unspecified vomiting type   4. [redacted] weeks gestation of pregnancy      P:  Dc home Start Lantus  20 units Q AM and 10 untis QPM. Take 12 hours apart Will be in close contact with patient Small, frequent meals/ snacks  Garrell Flagg, Delon FERNS, NP 12/18/2023 8:54 PM

## 2023-12-18 NOTE — Discharge Instructions (Addendum)
 Stop the NPH insulin . Hold the fast acting insulin  (Novolog ) before meals. Do not take this.   Hold metformin  due to GI upset. Do not pick up from the pharmacy.   Start Lantus  20 units before bed. This is long acting insulin . This will help bring your BS down at your fasting BS.  Start Lantus  10 units in the morning. Try to get these 12 hours apart (9 am and 9 PM)  Be sure to eat a protein snack before you go to bed (cheese, nuts, yogurt, peanut butter)   Eat 3 meals and 3 snacks throughout the day. Do not skip meals.

## 2023-12-18 NOTE — MAU Note (Addendum)
 Blood sugar on dexcom reading is 89

## 2023-12-23 ENCOUNTER — Telehealth: Payer: Self-pay | Admitting: *Deleted

## 2023-12-23 NOTE — Telephone Encounter (Signed)
 Left patient a message to call the office to schedule DM appointments with Delon Emms.

## 2023-12-29 ENCOUNTER — Telehealth: Payer: Self-pay

## 2023-12-29 ENCOUNTER — Encounter: Admitting: Obstetrics and Gynecology

## 2023-12-29 NOTE — Telephone Encounter (Signed)
 RN attempted to call patient to request to connect to appointment with Delon Emms, NP. RN left HIPAA compliant voicemail to return RN call, 2 links sent via mychart to request to connect to video visit.  Silvano LELON Piano, RN

## 2023-12-30 ENCOUNTER — Telehealth: Admitting: Physician Assistant

## 2023-12-30 ENCOUNTER — Inpatient Hospital Stay (HOSPITAL_COMMUNITY)
Admission: AD | Admit: 2023-12-30 | Discharge: 2023-12-30 | Disposition: A | Discharge status: Home / Self Care | Attending: Obstetrics and Gynecology | Admitting: Obstetrics and Gynecology

## 2023-12-30 ENCOUNTER — Other Ambulatory Visit: Payer: Self-pay

## 2023-12-30 DIAGNOSIS — O0991 Supervision of high risk pregnancy, unspecified, first trimester: Secondary | ICD-10-CM | POA: Insufficient documentation

## 2023-12-30 DIAGNOSIS — O98811 Other maternal infectious and parasitic diseases complicating pregnancy, first trimester: Secondary | ICD-10-CM | POA: Diagnosis not present

## 2023-12-30 DIAGNOSIS — O26891 Other specified pregnancy related conditions, first trimester: Secondary | ICD-10-CM | POA: Diagnosis not present

## 2023-12-30 DIAGNOSIS — B3731 Acute candidiasis of vulva and vagina: Secondary | ICD-10-CM

## 2023-12-30 DIAGNOSIS — N898 Other specified noninflammatory disorders of vagina: Secondary | ICD-10-CM | POA: Diagnosis present

## 2023-12-30 DIAGNOSIS — O0990 Supervision of high risk pregnancy, unspecified, unspecified trimester: Secondary | ICD-10-CM

## 2023-12-30 DIAGNOSIS — Z3A10 10 weeks gestation of pregnancy: Secondary | ICD-10-CM | POA: Diagnosis not present

## 2023-12-30 LAB — WET PREP, GENITAL
Clue Cells Wet Prep HPF POC: NONE SEEN
Sperm: NONE SEEN
Trich, Wet Prep: NONE SEEN
WBC, Wet Prep HPF POC: <10 (ref <10)

## 2023-12-30 MED ORDER — FLUCONAZOLE 150 MG PO TABS: 150.0000 mg | ORAL_TABLET | Freq: Once | ORAL | 0 refills | Status: AC

## 2023-12-30 NOTE — MAU Note (Signed)
 Kelsey Jones  is a 33 y.o. at [redacted]w[redacted]d here in MAU reporting: vaginal discharge - white watery, with fish odor. Sharp abd pains noted occ. Denies VB,  Onset of complaint: week ago Pain score: 5/10 Vitals:   12/30/23 1404  BP: 105/78  Pulse: 94  Resp: 18  Temp: 99.2 F (37.3 C)  SpO2: 100%     FHT: attempted but unable to find  Lab orders placed from triage: see orders

## 2023-12-30 NOTE — MAU Provider Note (Signed)
 History     CSN: [id number]  Arrival date and time: 12/30/23 1244 First Provider Initiated Contact with Patient   Chief Complaint  Patient presents with   Vaginal Discharge    With odor- fish    HPI Kelsey Jones  is a 33 y.o. [id number] at [redacted]w[redacted]d, 07/25/2024, by Last Menstrual Period, who presents to the Maternity Assessment Unit for vaginal discharge with odor. Symptoms began about one week ago. She endorses associated cramping w/o vaginal bleeding. Sexually active, partner present.   Medications Prior to Admission  Medication Sig Dispense Refill Last Dose/Taking   acetaminophen  (TYLENOL ) 500 MG tablet Take 500 mg by mouth every 6 (six) hours as needed for headache (cramps).      Blood Glucose Monitoring Suppl DEVI 1 each by Does not apply route in the morning, at noon, and at bedtime. May substitute to any manufacturer covered by patient's insurance. 1 each 0    Blood Pressure Monitoring (BLOOD PRESSURE KIT) DEVI 1 Device by Does not apply route once a week. 1 each 0    cephALEXin  (KEFLEX ) 500 MG capsule Take 1 capsule (500 mg total) by mouth 2 (two) times daily. 14 capsule 0    famotidine (PEPCID) 20 MG tablet Take 1 tablet (20 mg total) by mouth 2 (two) times daily. 180 tablet 3    Glucose Blood (BLOOD GLUCOSE TEST STRIPS) STRP 1 each by Does not apply route in the morning, at noon, in the evening, and at bedtime. May substitute to any manufacturer covered by patient's insurance. 120 strip 12    hydrocortisone  2.5 % ointment Apply topically 2 (two) times daily. (Patient not taking: Reported on 11/26/2023) 30 g 0    insulin  glargine (LANTUS ) 100 UNIT/ML Solostar Pen Inject 20 Units into the skin at bedtime. 6 mL 2    Insulin  Pen Needle (BD PEN NEEDLE NANO 2ND GEN) 32G X 4 MM MISC 1 Units by Does not apply route daily. 30 each 12    Lancet Device MISC 1 each by Does not apply route in the morning, at noon, and at bedtime. May substitute to any manufacturer covered by patient's  insurance. 1 each 0    Lancets Misc. MISC 1 each by Does not apply route in the morning, at noon, and at bedtime. May substitute to any manufacturer covered by patient's insurance. 100 each 0    metFORMIN  (GLUCOPHAGE ) 500 MG tablet Take 1 tablet (500 mg total) by mouth 2 (two) times daily with a meal. Start with 500 mg BID then increase to 1000 mg BID after 7 days 90 tablet 1    ondansetron  (ZOFRAN ) 4 MG tablet Take 1 tablet (4 mg total) by mouth every 8 (eight) hours as needed for nausea or vomiting. 30 tablet 1    Prenatal Vit-Fe Fumarate-FA (PREPLUS) 27-1 MG TABS Take 1 tablet by mouth daily. 30 tablet 13     Past Medical History:  Diagnosis Date   Gestational diabetes    metformin    Medical history non-contributory    Short cervix    Type 2 diabetes mellitus (HCC)     Past Surgical History:  Procedure Laterality Date   INDUCED ABORTION       Allergies: No Known Allergies  ROS reviewed and pertinent positives and negatives as documented in HPI.    Physical Exam  BP 105/78 (BP Location: Right Arm)   Pulse 94   Temp 99.2 F (37.3 C) (Oral)   Resp 18   LMP 10/19/2023 (Exact Date)  SpO2 100%   Gen: alert, no acute distress CV: regular rate Resp: nonlabored Abd: nontender  Cervical Exam  N/A  FHT No FHT by Doppler in triage, likely d/t early GA and uterus still within the pelvic brim.  Labs     Results for orders placed or performed during the hospital encounter of 12/30/23 (from the past 24 hours)  Wet prep, genital     Status: Abnormal   Collection Time: 12/30/23  2:06 PM  Result Value Ref Range   Yeast Wet Prep HPF POC PRESENT (A) NONE SEEN   Trich, Wet Prep NONE SEEN NONE SEEN   Clue Cells Wet Prep HPF POC NONE SEEN NONE SEEN   WBC, Wet Prep HPF POC <10 <10   Sperm NONE SEEN     Assessment and Plan  MDM Kelsey Jones  is a 33 y.o. H4E7977 at [redacted]w[redacted]d, 07/25/2024, by Last Menstrual Period, who presents to the MAU for vaginitis.  Ddx:  vaginal  discharge includes but is not limited to: ROM, bacterial vaginosis, vulvovaginal candidiasis, gonorrhea, chlamydia, physiologic discharge, urinary incontinence.   Wet prep with yeast, no clue cells. Will treat for yeast, pt advised to f/u with OB office if sx not improved after 3days. Answered questions about whether intercourse can contribute to this type of infection. No FHT by doppler in triage. This is likely d/t early GA, in the absence of VB an US  was not performed.      ICD-10-CM   1. Supervision of high risk pregnancy, antepartum  O09.90     2. Yeast vaginitis  B37.31     3. [redacted] weeks gestation of pregnancy  Z3A.10        Results pending at the time of DC: GC/CT Dispo: DC home in stable condition with return precautions discussed and included in AVS.    Barabara Maier, DO FMOB Fellow, Faculty Practice [facility], [facility]

## 2023-12-30 NOTE — Progress Notes (Signed)
 For the safety of you and your child, I recommend a face to face office visit with a health care provider.  Many mothers need to take medicines during their pregnancy.  Most are generally considered safe for a mother to take but some medicines must be avoided.  After reviewing your E-Visit request, I recommend that you consult your OB/GYN for medical advice in relation to your condition and prescription medications while pregnant.  NOTE: There will be NO CHARGE for this E-Visit   If you are having a true medical emergency, please call 911.     For an urgent face to face visit, [place] has multiple urgent care centers for your convenience.  Click the link below for the full list of locations and hours, walk-in wait times, appointment scheduling options and driving directions:  Urgent Care - [place], [place], [place], [place], [place], [place]  [place]     Your MyChart E-visit questionnaire answers were reviewed by a board certified advanced clinical practitioner to complete your personal care plan based on your specific symptoms.  Thank you for using e-Visits.

## 2023-12-31 ENCOUNTER — Ambulatory Visit

## 2023-12-31 LAB — GC/CHLAMYDIA PROBE AMP (~~LOC~~) NOT AT ARMC
Chlamydia: NEGATIVE
Comment: NEGATIVE
Comment: NORMAL
Neisseria Gonorrhea: NEGATIVE

## 2024-01-01 ENCOUNTER — Telehealth

## 2024-01-01 DIAGNOSIS — O23591 Infection of other part of genital tract in pregnancy, first trimester: Secondary | ICD-10-CM

## 2024-01-02 NOTE — Progress Notes (Signed)
 For the safety of you and your child, I recommend a face to face office visit with a health care provider.  Many mothers need to take medicines during their pregnancy and while nursing.  Most are generally considered safe for a mother to take but some medicines must be avoided.  After reviewing your E-Visit request, I recommend that you consult your OB/GYN for medical advice in relation to your condition and prescription medications while pregnant or breastfeeding.  NOTE: There will be NO CHARGE for this E-Visit   If you are having a true medical emergency, please call 911.     For an urgent face to face visit, [place] has multiple urgent care centers for your convenience.  Click the link below for the full list of locations and hours, walk-in wait times, appointment scheduling options and driving directions:  Urgent Care - [place], [place], [place], [place], [place], [place]  [place]     Your MyChart E-visit questionnaire answers were reviewed by a board certified advanced clinical practitioner to complete your personal care plan based on your specific symptoms.  Thank you for using e-Visits.

## 2024-01-06 ENCOUNTER — Encounter: Admitting: Obstetrics & Gynecology

## 2024-01-06 DIAGNOSIS — O0990 Supervision of high risk pregnancy, unspecified, unspecified trimester: Secondary | ICD-10-CM

## 2024-01-06 DIAGNOSIS — Z3A11 11 weeks gestation of pregnancy: Secondary | ICD-10-CM

## 2024-01-08 ENCOUNTER — Inpatient Hospital Stay (HOSPITAL_COMMUNITY)
Admission: AD | Admit: 2024-01-08 | Discharge: 2024-01-09 | Disposition: A | Discharge status: Home / Self Care | Payer: Self-pay | Attending: Family Medicine | Admitting: Family Medicine

## 2024-01-08 DIAGNOSIS — O0990 Supervision of high risk pregnancy, unspecified, unspecified trimester: Secondary | ICD-10-CM

## 2024-01-08 DIAGNOSIS — B3731 Acute candidiasis of vulva and vagina: Secondary | ICD-10-CM | POA: Insufficient documentation

## 2024-01-08 DIAGNOSIS — O021 Missed abortion: Secondary | ICD-10-CM | POA: Insufficient documentation

## 2024-01-08 DIAGNOSIS — N939 Abnormal uterine and vaginal bleeding, unspecified: Secondary | ICD-10-CM | POA: Insufficient documentation

## 2024-01-08 NOTE — MAU Note (Addendum)
 Pt says she was here 2 weeks  for vag D/C - positive for yeast infection. Treated .  Has continued to have vag D/C and odor. Started VB on Tuesday - light amt - light red - on pad. Also started cramping.  Wed- Fri  VB and cramping all the same as Tuesday   So tonight - at work - cramping- now 5/10 - no meds . VB- in Triage- on  black underwear- none on clothes .

## 2024-01-09 ENCOUNTER — Inpatient Hospital Stay (HOSPITAL_COMMUNITY)

## 2024-01-09 ENCOUNTER — Encounter (HOSPITAL_COMMUNITY): Payer: Self-pay | Admitting: Family Medicine

## 2024-01-09 DIAGNOSIS — B3731 Acute candidiasis of vulva and vagina: Secondary | ICD-10-CM | POA: Diagnosis present

## 2024-01-09 DIAGNOSIS — Z3A11 11 weeks gestation of pregnancy: Secondary | ICD-10-CM | POA: Diagnosis not present

## 2024-01-09 DIAGNOSIS — O209 Hemorrhage in early pregnancy, unspecified: Secondary | ICD-10-CM | POA: Diagnosis present

## 2024-01-09 DIAGNOSIS — O021 Missed abortion: Secondary | ICD-10-CM | POA: Diagnosis not present

## 2024-01-09 DIAGNOSIS — N939 Abnormal uterine and vaginal bleeding, unspecified: Secondary | ICD-10-CM | POA: Diagnosis not present

## 2024-01-09 LAB — URINALYSIS, ROUTINE W REFLEX MICROSCOPIC
Bilirubin Urine: NEGATIVE
Glucose, UA: 50 mg/dL — AB
Ketones, ur: NEGATIVE mg/dL
Nitrite: NEGATIVE
Protein, ur: 30 mg/dL — AB
Specific Gravity, Urine: 1.026 (ref 1.005–1.030)
pH: 6 (ref 5.0–8.0)

## 2024-01-09 LAB — CBC
HCT: 34.7 % — ABNORMAL LOW (ref 36.0–46.0)
Hemoglobin: 11.4 g/dL — ABNORMAL LOW (ref 12.0–15.0)
MCH: 26.5 pg (ref 26.0–34.0)
MCHC: 32.9 g/dL (ref 30.0–36.0)
MCV: 80.5 fL (ref 80.0–100.0)
Platelets: 249 K/uL (ref 150–400)
RBC: 4.31 MIL/uL (ref 3.87–5.11)
RDW: 13.3 % (ref 11.5–15.5)
WBC: 8.7 K/uL (ref 4.0–10.5)
nRBC: 0 % (ref 0.0–0.2)

## 2024-01-09 LAB — HCG, QUANTITATIVE, PREGNANCY: hCG, Beta Chain, Quant, S: 16658 m[IU]/mL — ABNORMAL HIGH (ref <5)

## 2024-01-09 LAB — WET PREP, GENITAL
Sperm: NONE SEEN
Trich, Wet Prep: NONE SEEN
WBC, Wet Prep HPF POC: <10 (ref <10)
Yeast Wet Prep HPF POC: NONE SEEN

## 2024-01-09 MED ORDER — METRONIDAZOLE 500 MG PO TABS: 500.0000 mg | ORAL_TABLET | Freq: Two times a day (BID) | ORAL | 0 refills | Status: DC

## 2024-01-09 MED ORDER — IBUPROFEN 800 MG PO TABS: 800.0000 mg | ORAL_TABLET | Freq: Four times a day (QID) | ORAL | 0 refills | Status: DC | PRN

## 2024-01-09 MED ORDER — ONDANSETRON 4 MG PO TBDP: 4.0000 mg | ORAL_TABLET | Freq: Four times a day (QID) | ORAL | 0 refills | Status: DC | PRN

## 2024-01-09 MED ORDER — OXYCODONE-ACETAMINOPHEN 5-325 MG PO TABS
1.0000 | ORAL_TABLET | Freq: Four times a day (QID) | ORAL | 0 refills | Status: AC | PRN
Start: 2024-01-09 — End: ?

## 2024-01-09 MED ORDER — ACETAMINOPHEN 325 MG PO TABS
650.0000 mg | ORAL_TABLET | Freq: Once | ORAL | Status: AC
  Administered 2024-01-09: 650 mg via ORAL
  Filled 2024-01-09: qty 2

## 2024-01-09 MED ORDER — MISOPROSTOL 200 MCG PO TABS: 800.0000 ug | ORAL_TABLET | Freq: Once | ORAL | 0 refills | Status: DC

## 2024-01-09 NOTE — MAU Provider Note (Signed)
 History     CSN: [id number]  Arrival date and time: 01/08/24 2333   Event Date/Time   First Provider Initiated Contact with Patient 01/09/24 0032      No chief complaint on file.  Kelsey Jones  is a 33 y.o. [id number] at [redacted]w[redacted]d who receives care at [facility].  She presents today for vaginal bleeding.  She reports she started having light red bleeding on Tuesday that continues today. She states she started having cramping on Wednesday that has gotten worse tonight.  However, she has not taken any medications or required usage of a pad.  She states that she also has vaginal odor that has been present since her last visit on 10/15.  Patient states that she was treated for yeast at that time.  Patient also states that FHTs were not obtained and she was told this was due to her early gestation.    OB History     Gravida  5   Para  2   Term  2   Preterm  0   AB  2   Living  2      SAB  1   IAB  1   Ectopic  0   Multiple  0   Live Births  2           Past Medical History:  Diagnosis Date   Gestational diabetes    metformin    Medical history non-contributory    Short cervix    Type 2 diabetes mellitus (HCC)     Past Surgical History:  Procedure Laterality Date   INDUCED ABORTION      Family History  Problem Relation Age of Onset   Hypertension Mother    Diabetes Mother    Breast cancer Mother    Cancer Maternal Grandmother    Diabetes Maternal Grandmother    Stroke Maternal Grandmother    Cancer Maternal Grandfather     Social History   Tobacco Use   Smoking status: Former    Types: Cigars   Smokeless tobacco: Never  Vaping Use   Vaping status: Never Used  Substance Use Topics   Alcohol use: No    Comment: Occasionally   Drug use: No    Allergies: No Known Allergies  Medications Prior to Admission  Medication Sig Dispense Refill Last Dose/Taking   acetaminophen  (TYLENOL ) 500 MG tablet Take 500 mg by mouth every 6 (six) hours as  needed for headache (cramps).      Blood Glucose Monitoring Suppl DEVI 1 each by Does not apply route in the morning, at noon, and at bedtime. May substitute to any manufacturer covered by patient's insurance. 1 each 0    Blood Pressure Monitoring (BLOOD PRESSURE KIT) DEVI 1 Device by Does not apply route once a week. 1 each 0    famotidine (PEPCID) 20 MG tablet Take 1 tablet (20 mg total) by mouth 2 (two) times daily. 180 tablet 3    insulin  glargine (LANTUS ) 100 UNIT/ML Solostar Pen Inject 20 Units into the skin at bedtime. 6 mL 2    Insulin  Pen Needle (BD PEN NEEDLE NANO 2ND GEN) 32G X 4 MM MISC 1 Units by Does not apply route daily. 30 each 12    metFORMIN  (GLUCOPHAGE ) 500 MG tablet Take 1 tablet (500 mg total) by mouth 2 (two) times daily with a meal. Start with 500 mg BID then increase to 1000 mg BID after 7 days 90 tablet 1    ondansetron  (ZOFRAN ) 4  MG tablet Take 1 tablet (4 mg total) by mouth every 8 (eight) hours as needed for nausea or vomiting. 30 tablet 1    Prenatal Vit-Fe Fumarate-FA (PREPLUS) 27-1 MG TABS Take 1 tablet by mouth daily. 30 tablet 13     Review of Systems  Gastrointestinal:  Negative for abdominal pain.  Genitourinary:  Positive for vaginal bleeding. Negative for difficulty urinating, dysuria and vaginal discharge.   Physical Exam   Blood pressure 115/79, pulse 96, temperature 97.6 F (36.4 C), temperature source Oral, resp. rate 14, height 5' 7 (1.702 m), weight 75.3 kg, last menstrual period 10/19/2023, unknown if currently breastfeeding.  Physical Exam Vitals and nursing note reviewed. Exam conducted with a chaperone present Rae, RN).  Constitutional:      Appearance: Normal appearance. She is well-developed.  HENT:     Head: Normocephalic and atraumatic.  Eyes:     Conjunctiva/sclera: Conjunctivae normal.  Cardiovascular:     Rate and Rhythm: Normal rate.  Pulmonary:     Effort: Pulmonary effort is normal. No respiratory distress.  Abdominal:      Palpations: Abdomen is soft.     Tenderness: There is no abdominal tenderness.  Musculoskeletal:        General: Normal range of motion.     Cervical back: Normal range of motion.  Skin:    General: Skin is warm and dry.  Neurological:     Mental Status: She is alert.  Psychiatric:        Mood and Affect: Mood normal.        Behavior: Behavior normal.     MAU Course  Procedures Results for orders placed or performed during the hospital encounter of 01/08/24 (from the past 24 hours)  Wet prep, genital     Status: Abnormal   Collection Time: 01/09/24 12:03 AM  Result Value Ref Range   Yeast Wet Prep HPF POC NONE SEEN NONE SEEN   Trich, Wet Prep NONE SEEN NONE SEEN   Clue Cells Wet Prep HPF POC PRESENT (A) NONE SEEN   WBC, Wet Prep HPF POC <10 <10   Sperm NONE SEEN   Urinalysis, Routine w reflex microscopic -Urine, Clean Catch     Status: Abnormal   Collection Time: 01/09/24 12:12 AM  Result Value Ref Range   Color, Urine YELLOW YELLOW   APPearance HAZY (A) CLEAR   Specific Gravity, Urine 1.026 1.005 - 1.030   pH 6.0 5.0 - 8.0   Glucose, UA 50 (A) NEGATIVE mg/dL   Hgb urine dipstick MODERATE (A) NEGATIVE   Bilirubin Urine NEGATIVE NEGATIVE   Ketones, ur NEGATIVE NEGATIVE mg/dL   Protein, ur 30 (A) NEGATIVE mg/dL   Nitrite NEGATIVE NEGATIVE   Leukocytes,Ua TRACE (A) NEGATIVE   RBC / HPF 0-5 0 - 5 RBC/hpf   WBC, UA 21-50 0 - 5 WBC/hpf   Bacteria, UA FEW (A) NONE SEEN   Squamous Epithelial / HPF 6-10 0 - 5 /HPF   Mucus PRESENT   hCG, quantitative, pregnancy     Status: Abnormal   Collection Time: 01/09/24  1:52 AM  Result Value Ref Range   hCG, Beta Chain, Quant, S 16,658 (H) <5 mIU/mL  CBC     Status: Abnormal   Collection Time: 01/09/24  1:52 AM  Result Value Ref Range   WBC 8.7 4.0 - 10.5 K/uL   RBC 4.31 3.87 - 5.11 MIL/uL   Hemoglobin 11.4 (L) 12.0 - 15.0 g/dL   HCT 65.2 (L) 63.9 -  46.0 %   MCV 80.5 80.0 - 100.0 fL   MCH 26.5 26.0 - 34.0 pg   MCHC 32.9 30.0 -  36.0 g/dL   RDW 86.6 88.4 - 84.4 %   Platelets 249 150 - 400 K/uL   nRBC 0.0 0.0 - 0.2 %    Patient informed that the ultrasound is considered a limited OB ultrasound and is not intended to be a complete ultrasound exam.  Patient also informed that the ultrasound is not being completed with the intent of assessing for fetal or placental anomalies or any pelvic abnormalities.  Explained that the purpose of today's ultrasound is to assess for  viability.  Patient acknowledges the purpose of the exam and the limitations of the study.  BSUS shows IUP, but no distinguishable heart beat.   Results for orders placed or performed during the hospital encounter of 01/08/24 (from the past 24 hours)  Wet prep, genital     Status: Abnormal   Collection Time: 01/09/24 12:03 AM  Result Value Ref Range   Yeast Wet Prep HPF POC NONE SEEN NONE SEEN   Trich, Wet Prep NONE SEEN NONE SEEN   Clue Cells Wet Prep HPF POC PRESENT (A) NONE SEEN   WBC, Wet Prep HPF POC <10 <10   Sperm NONE SEEN   Urinalysis, Routine w reflex microscopic -Urine, Clean Catch     Status: Abnormal   Collection Time: 01/09/24 12:12 AM  Result Value Ref Range   Color, Urine YELLOW YELLOW   APPearance HAZY (A) CLEAR   Specific Gravity, Urine 1.026 1.005 - 1.030   pH 6.0 5.0 - 8.0   Glucose, UA 50 (A) NEGATIVE mg/dL   Hgb urine dipstick MODERATE (A) NEGATIVE   Bilirubin Urine NEGATIVE NEGATIVE   Ketones, ur NEGATIVE NEGATIVE mg/dL   Protein, ur 30 (A) NEGATIVE mg/dL   Nitrite NEGATIVE NEGATIVE   Leukocytes,Ua TRACE (A) NEGATIVE   RBC / HPF 0-5 0 - 5 RBC/hpf   WBC, UA 21-50 0 - 5 WBC/hpf   Bacteria, UA FEW (A) NONE SEEN   Squamous Epithelial / HPF 6-10 0 - 5 /HPF   Mucus PRESENT    US  OB Transvaginal Result Date: 01/09/2024 EXAM: OBSTETRIC ULTRASOUND FIRST TRIMESTER TECHNIQUE: Transvaginal first trimester obstetric pelvic duplex ultrasound was performed with real-time imaging, color flow Doppler imaging, and spectral analysis.  COMPARISON: None provided. CLINICAL HISTORY: Vaginal bleeding during pregnancy. FINDINGS: UTERUS: The uterus is retroverted. No focal myometrial mass. No injury or masses. GESTATIONAL SAC(S): Single normal appearing intrauterine gestational sac. No subchorionic hemorrhage. YOLK SAC: No yolk sac is identified. EMBRYO(<11WK) /FETUS(>=11WK): Fetus is identified demonstrating a crown-rump length of 28 mm. CROWN RUMP LENGTH: 28 mm. RATE OF CARDIAC ACTIVITY: No fetal cardiac activity is identified on grayscale, color Doppler, and B-mode imaging. Findings are compatible with a failed gestation. RIGHT OVARY: The maternal right ovary is unremarkable. LEFT OVARY: The maternal left ovary is unremarkable. FREE FLUID: No free fluid within the pelvis. ESTIMATED DUE DATE:  not applicable. IMPRESSION: 1. Intrauterine pregnancy with a single gestational sac and fetus (CRL 28 mm) without cardiac activity, compatible with a failed gestation. Electronically signed by: Dorethia Molt MD 01/09/2024 12:44 AM EDT RP Workstation: [id number]    MDM BSUS Wet Prep UA Pain Medication US  Prescriptions Coordination of Follow Up Assessment and Plan  33 year old, [id number]  SIUP at 11.5 weeks Vaginal Bleeding  -Reviewed POC with patient. -Exam performed. -Discussed treating vaginal odor as BV. Patient agreeable.  -BSUS  attempted and findings concerning for failed pregnancy. -Patient offered and accepts pain medication. Give tylenol  and heating pad.  -Send for US  and await results.  -Wet prep and UA ordered.   Harlene LITTIE Duncans 01/09/2024, 12:32 AM   Reassessment (1:45 AM) -Results as above. -Condolences given. -Patient appropriately upset.  -Questions regarding causes addressed. Support given. Reassured that nothing she did caused this outcome.  -Reviewed options for management including: 1) Expectant Management  2) Cytotec  (Misoprostol ) 3) Dilation and curettage: this is a surgical procedure to remove the pregnancy  from the uterus -Discussed r/b associated with each option.  -Patient decides to proceed with cytotec  dosing. Reviewed usage and anticipated effects including bleeding, fever, chills, abdominal pain/cramping, and GI upset. -Rx for cytotec , zofran , ibuprofen , and percocet sent to pharmacy on file.  -Rx for metronidazole  also sent.  -Urine culture ordered.  -Bleeding precautions reviewed. -Return precautions reviewed. -Message sent to [facility] for scheduling of 2wk follow up.  -Pregnancy episode resolved.    Harlene LITTIE Duncans MSN, CNM Advanced Practice Provider, [facility] [employer]

## 2024-01-09 NOTE — Discharge Instructions (Signed)
 You were given a prescription for 3 medications; ibuprofen  for mild to moderate pain, zofran  for nausea, and percocet for moderate to severe pain.    If you have any problems with obtaining any of the medications, at the pharmacy, please call your office. At your earliest convenience you should place the cytotec  in your cheeks/gums and allow the medication to absorb.  You may experience some chills, fever, abdominal pain, nausea, and/or diarrhea after taking the medication. Bleeding should start within 24 hours, but no greater than 48.  If no bleeding has occurred after 48 hours contact your office. You will need to call your office or to go the Maternity Assessment Unit or nearest emergency room for:  -Bleeding that fills up 2 or more pads per hour. -Severe abdominal pain. -Dizziness or lightheadedness. -Passing out -Any other medical concerns

## 2024-01-10 LAB — CULTURE, OB URINE: Culture: 1000 — AB

## 2024-01-13 ENCOUNTER — Other Ambulatory Visit: Payer: Self-pay

## 2024-01-13 ENCOUNTER — Inpatient Hospital Stay (HOSPITAL_COMMUNITY)
Admission: AD | Admit: 2024-01-13 | Discharge: 2024-01-13 | Disposition: A | Discharge status: Home / Self Care | Payer: Self-pay | Attending: Obstetrics and Gynecology | Admitting: Obstetrics and Gynecology

## 2024-01-13 ENCOUNTER — Encounter (HOSPITAL_COMMUNITY): Payer: Self-pay | Admitting: *Deleted

## 2024-01-13 DIAGNOSIS — O039 Complete or unspecified spontaneous abortion without complication: Secondary | ICD-10-CM | POA: Diagnosis present

## 2024-01-13 DIAGNOSIS — R1030 Lower abdominal pain, unspecified: Secondary | ICD-10-CM

## 2024-01-13 MED ORDER — OXYCODONE HCL 5 MG PO TABS
5.0000 mg | ORAL_TABLET | Freq: Once | ORAL | Status: AC
  Administered 2024-01-13: 5 mg via ORAL
  Filled 2024-01-13: qty 1

## 2024-01-13 NOTE — MAU Provider Note (Signed)
 Chief Complaint:  Abdominal Pain   HPI   None     Kelsey Jones  is a 33 y.o. [id number] who presents to maternity admissions reporting vaginal bleeding and pain since 10/24.  Last took ibuprofen  this morning around 8am. Last dose of oxycodone  at 1am this morning. Unable to take oxycodone  while at work because it makes her drowsy. Having vaginal bleeding similar to a period, but is not concerned about that amount at this time. Took one day off of work since diagnosis of miscarriage.   Pregnancy Course: Was recently in the MAU on 10/24-25 and diagnosed with miscarriage, given cytotec  at that time with pain medications.   Past Medical History:  Diagnosis Date   Gestational diabetes    metformin    Medical history non-contributory    Short cervix    Type 2 diabetes mellitus (HCC)    OB History  Gravida Para Term Preterm AB Living  6 2 2  0 2 2  SAB IAB Ectopic Multiple Live Births  1 1 0 0 2    # Outcome Date GA Lbr Len/2nd Weight Sex Type Anes PTL Lv  6 Current           5 SAB 2022     SAB     4 Term 08/03/16 [redacted]w[redacted]d 02:50 / 00:22 3731 g F Vag-Spont EPI  LIV  3 Term 03/17/10 [redacted]w[redacted]d   F Vag-Spont   LIV  2 Gravida           1 IAB            Past Surgical History:  Procedure Laterality Date   INDUCED ABORTION     Family History  Problem Relation Age of Onset   Hypertension Mother    Diabetes Mother    Breast cancer Mother    Cancer Maternal Grandmother    Diabetes Maternal Grandmother    Stroke Maternal Grandmother    Cancer Maternal Grandfather    Social History   Tobacco Use   Smoking status: Former    Types: Cigars   Smokeless tobacco: Never  Vaping Use   Vaping status: Never Used  Substance Use Topics   Alcohol use: No    Comment: Occasionally   Drug use: No   No Known Allergies No medications prior to admission.    I have reviewed patient's Past Medical Hx, Surgical Hx, Family Hx, Social Hx, medications and allergies.   ROS  Pertinent items noted in HPI  and remainder of comprehensive ROS otherwise negative.   PHYSICAL EXAM   Vitals:   01/13/24 1550 01/13/24 1600 01/13/24 1724  BP:  103/78 110/85  Pulse:  91 84  Temp:  98.5 F (36.9 C) 98.6 F (37 C)  Resp:  18 19  Height: 5' 7 (1.702 m)    Weight: 73.5 kg    SpO2:  100% 99%  TempSrc:  Oral Oral  BMI (Calculated): 25.38       Constitutional: Uncomfortable, breathing through cramping  Cardiovascular: normal rate & rhythm, warm and well-perfused Respiratory: normal effort, no problems with respiration noted GI: Abd soft, non-tender, non-distended MS: Extremities nontender, no edema, normal ROM Neurologic: Alert and oriented x 4.  Pelvic: Deferred     Labs: No results found for this or any previous visit (from the past 24 hours).  Imaging:  No results found.  MDM & MAU COURSE  MDM: Low  MAU Course: Orders Placed This Encounter  Procedures   Discharge patient   Meds ordered this  encounter  Medications   oxyCODONE  (Oxy IR/ROXICODONE ) immediate release tablet 5 mg    Refill:  0   VSS. Tearful throughout visit. Discussed other pain medication options, but did note that since oxycodone  is helpful she should take it. Counseled on  taking time away from work to grieve, which patient would like to do. Given dose of oxycodone  and work note for patient to be out of work for the rest of the week. All questions answered.   ASSESSMENT   1. Miscarriage   2. Lower abdominal pain     PLAN  Discharge home in stable condition with return precautions.  Follow up with [facility] as scheduled.    Allergies as of 01/13/2024   No Known Allergies      Medication List     TAKE these medications    BD Pen Needle Nano 2nd Gen 32G X 4 MM Misc Generic drug: Insulin  Pen Needle 1 Units by Does not apply route daily.   Blood Glucose Monitoring Suppl Devi 1 each by Does not apply route in the morning, at noon, and at bedtime. May substitute to any manufacturer covered by patient's  insurance.   Blood Pressure Kit Devi 1 Device by Does not apply route once a week.   famotidine 20 MG tablet Commonly known as: PEPCID Take 1 tablet (20 mg total) by mouth 2 (two) times daily.   ibuprofen  800 MG tablet Commonly known as: ADVIL  Take 1 tablet (800 mg total) by mouth every 6 (six) hours as needed for moderate pain (pain score 4-6).   insulin  glargine 100 UNIT/ML Solostar Pen Commonly known as: LANTUS  Inject 20 Units into the skin at bedtime.   metFORMIN  500 MG tablet Commonly known as: GLUCOPHAGE  Take 1 tablet (500 mg total) by mouth 2 (two) times daily with a meal. Start with 500 mg BID then increase to 1000 mg BID after 7 days   metroNIDAZOLE  500 MG tablet Commonly known as: FLAGYL  Take 1 tablet (500 mg total) by mouth 2 (two) times daily.   misoprostol  200 MCG tablet Commonly known as: Cytotec  Take 4 tablets (800 mcg total) by mouth once for 1 dose.   ondansetron  4 MG disintegrating tablet Commonly known as: ZOFRAN -ODT Take 1-2 tablets (4-8 mg total) by mouth every 6 (six) hours as needed for nausea or vomiting.   ondansetron  4 MG tablet Commonly known as: Zofran  Take 1 tablet (4 mg total) by mouth every 8 (eight) hours as needed for nausea or vomiting.   oxyCODONE -acetaminophen  5-325 MG tablet Commonly known as: PERCOCET/ROXICET Take 1-2 tablets by mouth every 6 (six) hours as needed.   PrePLUS 27-1 MG Tabs Take 1 tablet by mouth daily.        Charlie Courts, MD  Family Medicine - Obstetrics Fellow

## 2024-01-13 NOTE — MAU Note (Signed)
 Kelsey Jones  is a 33 y.o. at Unknown here in MAU reporting: she's having a miscarriage and thinks that something is wrong.  States she's taken the meds to initiate the process 2 days ago but is having intense abdominal cramping.  States she's taking Ibuprofen  but can't take the Oxycodone  when working, hasn't taken the oxycodone  since last night at 0100.  Reports she recently arrived at work and is cramping and having pain in abdomen and lower back.  Reports can't take off work because can't time off.  LMP: 10/19/2023 Onset of complaint: today Pain score: 9 Vitals:   01/13/24 1600  BP: 103/78  Pulse: 91  Resp: 18  Temp: 98.5 F (36.9 C)  SpO2: 100%     FHT: NA  Lab orders placed from triage: None

## 2024-01-13 NOTE — Discharge Instructions (Signed)
 You were seen in the MAU due to abdominal pain and bleeding. It would be best for you to take time away from work so that you're able to take the medications for pain and have time to recover. I have sent you a work excuse note in MyChart for the rest of the week. Take care of yourself.

## 2024-01-15 ENCOUNTER — Inpatient Hospital Stay (HOSPITAL_COMMUNITY)
Admission: AD | Admit: 2024-01-15 | Discharge: 2024-01-16 | Disposition: A | Discharge status: Home / Self Care | Attending: Obstetrics and Gynecology | Admitting: Obstetrics and Gynecology

## 2024-01-15 ENCOUNTER — Encounter (HOSPITAL_COMMUNITY): Payer: Self-pay | Admitting: Obstetrics and Gynecology

## 2024-01-15 ENCOUNTER — Other Ambulatory Visit: Payer: Self-pay

## 2024-01-15 DIAGNOSIS — Z794 Long term (current) use of insulin: Secondary | ICD-10-CM | POA: Insufficient documentation

## 2024-01-15 DIAGNOSIS — Z87891 Personal history of nicotine dependence: Secondary | ICD-10-CM | POA: Insufficient documentation

## 2024-01-15 DIAGNOSIS — E876 Hypokalemia: Secondary | ICD-10-CM | POA: Diagnosis present

## 2024-01-15 DIAGNOSIS — Z3A00 Weeks of gestation of pregnancy not specified: Secondary | ICD-10-CM | POA: Diagnosis not present

## 2024-01-15 DIAGNOSIS — O034 Incomplete spontaneous abortion without complication: Secondary | ICD-10-CM | POA: Insufficient documentation

## 2024-01-15 DIAGNOSIS — E1165 Type 2 diabetes mellitus with hyperglycemia: Secondary | ICD-10-CM | POA: Diagnosis present

## 2024-01-15 DIAGNOSIS — R739 Hyperglycemia, unspecified: Secondary | ICD-10-CM | POA: Diagnosis present

## 2024-01-15 DIAGNOSIS — O039 Complete or unspecified spontaneous abortion without complication: Secondary | ICD-10-CM | POA: Diagnosis not present

## 2024-01-15 DIAGNOSIS — O24119 Pre-existing diabetes mellitus, type 2, in pregnancy, unspecified trimester: Secondary | ICD-10-CM | POA: Diagnosis not present

## 2024-01-15 DIAGNOSIS — Z7984 Long term (current) use of oral hypoglycemic drugs: Secondary | ICD-10-CM | POA: Insufficient documentation

## 2024-01-15 DIAGNOSIS — R1030 Lower abdominal pain, unspecified: Secondary | ICD-10-CM

## 2024-01-15 DIAGNOSIS — O021 Missed abortion: Secondary | ICD-10-CM

## 2024-01-15 DIAGNOSIS — D72829 Elevated white blood cell count, unspecified: Secondary | ICD-10-CM | POA: Diagnosis present

## 2024-01-15 DIAGNOSIS — D649 Anemia, unspecified: Secondary | ICD-10-CM | POA: Diagnosis present

## 2024-01-15 LAB — COMPREHENSIVE METABOLIC PANEL WITH GFR
ALT: 12 U/L (ref 0–44)
AST: 17 U/L (ref 15–41)
Albumin: 3.6 g/dL (ref 3.5–5.0)
Alkaline Phosphatase: 47 U/L (ref 38–126)
Anion gap: 15 (ref 5–15)
BUN: 14 mg/dL (ref 6–20)
CO2: 21 mmol/L — ABNORMAL LOW (ref 22–32)
Calcium: 8.9 mg/dL (ref 8.9–10.3)
Chloride: 100 mmol/L (ref 98–111)
Creatinine, Ser: 0.77 mg/dL (ref 0.44–1.00)
GFR, Estimated: >60 mL/min (ref >60)
Glucose, Bld: 286 mg/dL — ABNORMAL HIGH (ref 70–99)
Potassium: 3.3 mmol/L — ABNORMAL LOW (ref 3.5–5.1)
Sodium: 136 mmol/L (ref 135–145)
Total Bilirubin: 0.4 mg/dL (ref 0.0–1.2)
Total Protein: 6.8 g/dL (ref 6.5–8.1)

## 2024-01-15 LAB — CBC WITH DIFFERENTIAL/PLATELET
Abs Immature Granulocytes: 0.09 K/uL — ABNORMAL HIGH (ref 0.00–0.07)
Basophils Absolute: 0 K/uL (ref 0.0–0.1)
Basophils Relative: 0 %
Eosinophils Absolute: 0 K/uL (ref 0.0–0.5)
Eosinophils Relative: 0 %
HCT: 30.1 % — ABNORMAL LOW (ref 36.0–46.0)
Hemoglobin: 10.2 g/dL — ABNORMAL LOW (ref 12.0–15.0)
Immature Granulocytes: 1 %
Lymphocytes Relative: 16 %
Lymphs Abs: 2.2 K/uL (ref 0.7–4.0)
MCH: 27.3 pg (ref 26.0–34.0)
MCHC: 33.9 g/dL (ref 30.0–36.0)
MCV: 80.7 fL (ref 80.0–100.0)
Monocytes Absolute: 1 K/uL (ref 0.1–1.0)
Monocytes Relative: 7 %
Neutro Abs: 10.6 K/uL — ABNORMAL HIGH (ref 1.7–7.7)
Neutrophils Relative %: 76 %
Platelets: 230 K/uL (ref 150–400)
RBC: 3.73 MIL/uL — ABNORMAL LOW (ref 3.87–5.11)
RDW: 13.2 % (ref 11.5–15.5)
WBC: 14 K/uL — ABNORMAL HIGH (ref 4.0–10.5)
nRBC: 0 % (ref 0.0–0.2)

## 2024-01-15 LAB — GLUCOSE, CAPILLARY: Glucose-Capillary: 252 mg/dL — ABNORMAL HIGH (ref 70–99)

## 2024-01-15 MED ORDER — HYDROMORPHONE HCL 1 MG/ML IJ SOLN
1.0000 mg | Freq: Once | INTRAMUSCULAR | Status: AC
  Administered 2024-01-15: 1 mg via INTRAVENOUS
  Filled 2024-01-15: qty 1

## 2024-01-15 MED ORDER — LACTATED RINGERS IV BOLUS
1000.0000 mL | Freq: Once | INTRAVENOUS | Status: AC
  Administered 2024-01-15: 1000 mL via INTRAVENOUS

## 2024-01-15 MED ORDER — KETOROLAC TROMETHAMINE 30 MG/ML IJ SOLN: 30.0000 mg | Freq: Once | INTRAMUSCULAR | Status: DC

## 2024-01-15 MED ORDER — KETOROLAC TROMETHAMINE 30 MG/ML IJ SOLN
30.0000 mg | Freq: Once | INTRAMUSCULAR | Status: AC
Start: 2024-01-15 — End: 2024-01-15
  Administered 2024-01-15: 30 mg via INTRAMUSCULAR
  Filled 2024-01-15: qty 1

## 2024-01-15 MED ORDER — ONDANSETRON HCL 40 MG/20ML IJ SOLN
8.0000 mg | Freq: Once | INTRAMUSCULAR | Status: AC
  Administered 2024-01-15: 8 mg via INTRAVENOUS
  Filled 2024-01-15: qty 4

## 2024-01-15 MED ORDER — POTASSIUM CHLORIDE 10 MEQ/100ML IV SOLN
10.0000 meq | INTRAVENOUS | Status: AC
  Administered 2024-01-15 – 2024-01-16 (×2): 10 meq via INTRAVENOUS
  Filled 2024-01-15 (×2): qty 100

## 2024-01-15 NOTE — MAU Provider Note (Incomplete)
 Chief Complaint:  Abdominal Pain   HPI   None     Kelsey Jones  is a 33 y.o. [id number] at Unknown who presents to maternity admissions with severe abdominal pain and vaginal bleeding.  She was seen in the MAU on 10/24 and diagnosed with an incomplete SAB.  She was given Cytotec , Zofran , ibuprofen , and Percocet for management with plans to follow-up in the office in 2 weeks.  She returned to the MAU on 1029 with vaginal bleeding and pain.  Pain improved with oxycodone , so recommended using oxycodone  as needed for pain at home and taking time off of work.  Patient returns today reporting abdominal pain and nausea/vomiting.  She has been nauseous all day, but her abdominal pain became severe around 1800.  She has been unable to keep any medication down by mouth.  Past medical history is also significant for type 2 diabetes with insulin , but she has not been checking sugars or using insulin  for the past 2 days.  Additional history obtained from partner  Past Medical History:  Diagnosis Date  . Gestational diabetes    metformin   . Medical history non-contributory   . Short cervix   . Type 2 diabetes mellitus (HCC)    OB History  Gravida Para Term Preterm AB Living  5 2 2  0 2 2  SAB IAB Ectopic Multiple Live Births  1 1 0 0 2    # Outcome Date GA Lbr Len/2nd Weight Sex Type Anes PTL Lv  5 SAB 2022     SAB     4 Term 08/03/16 [redacted]w[redacted]d 02:50 / 00:22 3731 g F Vag-Spont EPI  LIV  3 Term 03/17/10 [redacted]w[redacted]d   F Vag-Spont   LIV  2 Gravida           1 IAB            Past Surgical History:  Procedure Laterality Date  . INDUCED ABORTION     Family History  Problem Relation Age of Onset  . Hypertension Mother   . Diabetes Mother   . Breast cancer Mother   . Cancer Maternal Grandmother   . Diabetes Maternal Grandmother   . Stroke Maternal Grandmother   . Cancer Maternal Grandfather    Social History   Tobacco Use  . Smoking status: Former    Types: Cigars  . Smokeless tobacco: Never   Vaping Use  . Vaping status: Never Used  Substance Use Topics  . Alcohol use: No    Comment: Occasionally  . Drug use: No   No Known Allergies Medications Prior to Admission  Medication Sig Dispense Refill Last Dose/Taking  . ibuprofen  (ADVIL ) 800 MG tablet Take 1 tablet (800 mg total) by mouth every 6 (six) hours as needed for moderate pain (pain score 4-6). 21 tablet 0 01/15/2024  . insulin  glargine (LANTUS ) 100 UNIT/ML Solostar Pen Inject 20 Units into the skin at bedtime. 6 mL 2 Past Week  . metroNIDAZOLE  (FLAGYL ) 500 MG tablet Take 1 tablet (500 mg total) by mouth 2 (two) times daily. 14 tablet 0 01/14/2024  . oxyCODONE -acetaminophen  (PERCOCET/ROXICET) 5-325 MG tablet Take 1-2 tablets by mouth every 6 (six) hours as needed. 10 tablet 0 01/15/2024  . Blood Glucose Monitoring Suppl DEVI 1 each by Does not apply route in the morning, at noon, and at bedtime. May substitute to any manufacturer covered by patient's insurance. 1 each 0   . Blood Pressure Monitoring (BLOOD PRESSURE KIT) DEVI 1 Device by Does not  apply route once a week. 1 each 0   . famotidine (PEPCID) 20 MG tablet Take 1 tablet (20 mg total) by mouth 2 (two) times daily. (Patient not taking: Reported on 01/15/2024) 180 tablet 3 Not Taking  . Insulin  Pen Needle (BD PEN NEEDLE NANO 2ND GEN) 32G X 4 MM MISC 1 Units by Does not apply route daily. 30 each 12   . metFORMIN  (GLUCOPHAGE ) 500 MG tablet Take 1 tablet (500 mg total) by mouth 2 (two) times daily with a meal. Start with 500 mg BID then increase to 1000 mg BID after 7 days (Patient not taking: Reported on 01/15/2024) 90 tablet 1 Not Taking  . misoprostol  (CYTOTEC ) 200 MCG tablet Take 4 tablets (800 mcg total) by mouth once for 1 dose. 4 tablet 0   . ondansetron  (ZOFRAN ) 4 MG tablet Take 1 tablet (4 mg total) by mouth every 8 (eight) hours as needed for nausea or vomiting. (Patient not taking: Reported on 01/15/2024) 30 tablet 1 Not Taking  . ondansetron  (ZOFRAN -ODT) 4 MG  disintegrating tablet Take 1-2 tablets (4-8 mg total) by mouth every 6 (six) hours as needed for nausea or vomiting. (Patient not taking: Reported on 01/15/2024) 20 tablet 0 Not Taking  . Prenatal Vit-Fe Fumarate-FA (PREPLUS) 27-1 MG TABS Take 1 tablet by mouth daily. (Patient not taking: Reported on 01/15/2024) 30 tablet 13 Not Taking    I have reviewed patient's Past Medical Hx, Surgical Hx, Family Hx, Social Hx, medications and allergies.   ROS  Pertinent items noted in HPI and remainder of comprehensive ROS otherwise negative.   PHYSICAL EXAM  Patient Vitals for the past 24 hrs:  BP Temp Temp src Pulse Resp SpO2  01/15/24 2315 -- -- -- -- -- 100 %  01/15/24 2310 -- -- -- -- -- 100 %  01/15/24 2305 -- -- -- -- -- 100 %  01/15/24 2300 -- -- -- -- -- 100 %  01/15/24 2255 -- -- -- -- -- 100 %  01/15/24 2250 -- -- -- -- -- 100 %  01/15/24 2245 -- -- -- -- -- 100 %  01/15/24 2240 -- -- -- -- -- 100 %  01/15/24 2235 -- -- -- -- -- 100 %  01/15/24 2230 -- -- -- -- -- 99 %  01/15/24 2225 -- -- -- -- -- 100 %  01/15/24 2220 -- -- -- -- -- 100 %  01/15/24 2215 -- -- -- -- -- 100 %  01/15/24 2210 -- -- -- -- -- 99 %  01/15/24 2205 -- -- -- -- -- 99 %  01/15/24 2155 -- -- -- -- -- 94 %  01/15/24 2150 -- -- -- -- -- 92 %  01/15/24 2146 (!) 107/58 97.6 F (36.4 C) Axillary 85 14 95 %    Constitutional: Well-developed, well-nourished female. Ill-appearing, unable to sit still, pacing the room HEENT: atraumatic, normocephalic. Neck has normal ROM. EOM intact. Cardiovascular: normal rate & rhythm, warm and well-perfused Respiratory: normal effort, no problems with respiration noted GI: Abd soft, non-tender, non-distended MSK: Extremities nontender, no edema, normal ROM Skin: warm and dry. Acyanotic, no jaundice or pallor. Neurologic: Alert and oriented x 4. No abnormal coordination. Psychiatric: Normal mood. Speech not slurred, not rapid/pressured. Patient is cooperative. GU: no CVA  tenderness Pelvic exam: VULVA: normal appearing vulva with no masses, tenderness or lesions, VAGINA: normal appearing vagina with normal color, no lesions, watery blood pooled in vaginal canal removed with faux swabs, small <0.5 cm clots removed, CERVIX: multiparous  os, no clots or tissue stuck in cervix on visual examination, exam chaperoned by Hunter Stanley RN.      Labs: Results for orders placed or performed during the hospital encounter of 01/15/24 (from the past 24 hours)  Glucose, capillary     Status: Abnormal   Collection Time: 01/15/24  9:16 PM  Result Value Ref Range   Glucose-Capillary 252 (H) 70 - 99 mg/dL  CBC with Differential/Platelet     Status: Abnormal   Collection Time: 01/15/24  9:49 PM  Result Value Ref Range   WBC 14.0 (H) 4.0 - 10.5 K/uL   RBC 3.73 (L) 3.87 - 5.11 MIL/uL   Hemoglobin 10.2 (L) 12.0 - 15.0 g/dL   HCT 69.8 (L) 63.9 - 53.9 %   MCV 80.7 80.0 - 100.0 fL   MCH 27.3 26.0 - 34.0 pg   MCHC 33.9 30.0 - 36.0 g/dL   RDW 86.7 88.4 - 84.4 %   Platelets 230 150 - 400 K/uL   nRBC 0.0 0.0 - 0.2 %   Neutrophils Relative % 76 %   Neutro Abs 10.6 (H) 1.7 - 7.7 K/uL   Lymphocytes Relative 16 %   Lymphs Abs 2.2 0.7 - 4.0 K/uL   Monocytes Relative 7 %   Monocytes Absolute 1.0 0.1 - 1.0 K/uL   Eosinophils Relative 0 %   Eosinophils Absolute 0.0 0.0 - 0.5 K/uL   Basophils Relative 0 %   Basophils Absolute 0.0 0.0 - 0.1 K/uL   Immature Granulocytes 1 %   Abs Immature Granulocytes 0.09 (H) 0.00 - 0.07 K/uL  Comprehensive metabolic panel     Status: Abnormal   Collection Time: 01/15/24  9:49 PM  Result Value Ref Range   Sodium 136 135 - 145 mmol/L   Potassium 3.3 (L) 3.5 - 5.1 mmol/L   Chloride 100 98 - 111 mmol/L   CO2 21 (L) 22 - 32 mmol/L   Glucose, Bld 286 (H) 70 - 99 mg/dL   BUN 14 6 - 20 mg/dL   Creatinine, Ser 9.22 0.44 - 1.00 mg/dL   Calcium 8.9 8.9 - 89.6 mg/dL   Total Protein 6.8 6.5 - 8.1 g/dL   Albumin 3.6 3.5 - 5.0 g/dL   AST 17 15 - 41 U/L    ALT 12 0 - 44 U/L   Alkaline Phosphatase 47 38 - 126 U/L   Total Bilirubin 0.4 0.0 - 1.2 mg/dL   GFR, Estimated >39 >39 mL/min   Anion gap 15 5 - 15    Imaging:  No results found.  MDM & MAU COURSE  MDM: {MAUMDM:32424}  MAU Course: -Toradol IM and heating pads for pain relief to allow for IV access, labs, and physical exam. -IV fluids and IV Dilaudid for pain. IV Zofran  for active nausea/vomiting. CBG 252. -CBC and CMP to rule out GI causes of abdominal pain. -CBC with slightly decreased Hgb now 10.2. WBC elevated at 14.0. -CMP with significantly elevated glucose of 286, not surprising given no insulin  for 2 days. Mild hypokalemia. Will administer IV potassium.  Differential diagnosis considered for {MEDDX:32463}   Orders Placed This Encounter  Procedures  . CBC with Differential/Platelet  . Comprehensive metabolic panel  . Glucose, capillary   Meds ordered this encounter  Medications  . DISCONTD: ketorolac (TORADOL) 30 MG/ML injection 30 mg  . HYDROmorphone (DILAUDID) injection 1 mg  . lactated ringers  bolus 1,000 mL  . ketorolac (TORADOL) 30 MG/ML injection 30 mg  . ondansetron  (ZOFRAN ) 8 mg in  sodium chloride  0.9 % 50 mL IVPB  . potassium chloride  10 mEq in 100 mL IVPB  . lactated ringers  bolus 1,000 mL    ASSESSMENT  No diagnosis found.  PLAN  Discharge home in stable condition with return precautions.  Pain management: acetaminophen  1000 mg every 6 hours and ibuprofen  600 mg every 6 hours. Use oxycodone  for breakthrough pain not controlled by acetaminophen  and ibuprofen . Return to blood sugar checking and insulin  routine.     Allergies as of 01/15/2024   No Known Allergies   Med Rec must be completed prior to using this SMARTLINK***       Kelsey DELENA Sear, PA

## 2024-01-15 NOTE — MAU Note (Signed)
 Kelsey Jones  is a 33 y.o. at Unknown here in MAU reporting: abdominal pain and nausea/vomiting ongoing since 6pm. Diagnosed with a miscarriage last Saturday, 10/25 followed by cytotec  at home on Monday, 10/27. Unable to keep medications down by mouth. States she is a Type 2 diabetic on insulin , but hasn't been checking sugars or used insulin  in the past two days.  Onset of complaint: 6pm on 10/31 Pain score: 10/10 Vitals:   01/15/24 2146  BP: (!) 107/58  Pulse: 85  Resp: 14  Temp: 97.6 F (36.4 C)  SpO2: 95%

## 2024-01-15 NOTE — MAU Provider Note (Signed)
 Chief Complaint:  Abdominal Pain   HPI   None     Kelsey Jones  is a 33 y.o. [id number] who presents to maternity admissions with severe abdominal pain and vaginal bleeding.  She was seen in the MAU on 10/24 and diagnosed with an incomplete SAB.  She was given Cytotec , Zofran , ibuprofen , and Percocet for management with plans to follow-up in the office in 2 weeks.  She returned to the MAU on 1029 with vaginal bleeding and pain.  Pain improved with oxycodone , so recommended using oxycodone  as needed for pain at home and taking time off of work.  Patient returns today reporting abdominal pain and nausea/vomiting.  She has been nauseous all day, but her abdominal pain became severe around 1800.  She has been unable to keep any medication down by mouth.  Past medical history is also significant for type 2 diabetes with insulin , but she has not been checking sugars or using insulin  for the past 2 days.  Additional history obtained from partner  Past Medical History:  Diagnosis Date   Gestational diabetes    metformin    Medical history non-contributory    Short cervix    Type 2 diabetes mellitus (HCC)    OB History  Gravida Para Term Preterm AB Living  5 2 2  0 2 2  SAB IAB Ectopic Multiple Live Births  1 1 0 0 2    # Outcome Date GA Lbr Len/2nd Weight Sex Type Anes PTL Lv  5 SAB 2022     SAB     4 Term 08/03/16 [redacted]w[redacted]d 02:50 / 00:22 3731 g F Vag-Spont EPI  LIV  3 Term 03/17/10 [redacted]w[redacted]d   F Vag-Spont   LIV  2 Gravida           1 IAB            Past Surgical History:  Procedure Laterality Date   INDUCED ABORTION     Family History  Problem Relation Age of Onset   Hypertension Mother    Diabetes Mother    Breast cancer Mother    Cancer Maternal Grandmother    Diabetes Maternal Grandmother    Stroke Maternal Grandmother    Cancer Maternal Grandfather    Social History   Tobacco Use   Smoking status: Former    Types: Cigars   Smokeless tobacco: Never  Vaping Use   Vaping  status: Never Used  Substance Use Topics   Alcohol use: No    Comment: Occasionally   Drug use: No   No Known Allergies Medications Prior to Admission  Medication Sig Dispense Refill Last Dose/Taking   ibuprofen  (ADVIL ) 800 MG tablet Take 1 tablet (800 mg total) by mouth every 6 (six) hours as needed for moderate pain (pain score 4-6). 21 tablet 0 01/15/2024   insulin  glargine (LANTUS ) 100 UNIT/ML Solostar Pen Inject 20 Units into the skin at bedtime. 6 mL 2 Past Week   metroNIDAZOLE  (FLAGYL ) 500 MG tablet Take 1 tablet (500 mg total) by mouth 2 (two) times daily. 14 tablet 0 01/14/2024   oxyCODONE -acetaminophen  (PERCOCET/ROXICET) 5-325 MG tablet Take 1-2 tablets by mouth every 6 (six) hours as needed. 10 tablet 0 01/15/2024   Blood Glucose Monitoring Suppl DEVI 1 each by Does not apply route in the morning, at noon, and at bedtime. May substitute to any manufacturer covered by patient's insurance. 1 each 0    Blood Pressure Monitoring (BLOOD PRESSURE KIT) DEVI 1 Device by Does not apply route  once a week. 1 each 0    famotidine (PEPCID) 20 MG tablet Take 1 tablet (20 mg total) by mouth 2 (two) times daily. (Patient not taking: Reported on 01/15/2024) 180 tablet 3 Not Taking   Insulin  Pen Needle (BD PEN NEEDLE NANO 2ND GEN) 32G X 4 MM MISC 1 Units by Does not apply route daily. 30 each 12    metFORMIN  (GLUCOPHAGE ) 500 MG tablet Take 1 tablet (500 mg total) by mouth 2 (two) times daily with a meal. Start with 500 mg BID then increase to 1000 mg BID after 7 days (Patient not taking: Reported on 01/15/2024) 90 tablet 1 Not Taking   misoprostol  (CYTOTEC ) 200 MCG tablet Take 4 tablets (800 mcg total) by mouth once for 1 dose. 4 tablet 0    ondansetron  (ZOFRAN ) 4 MG tablet Take 1 tablet (4 mg total) by mouth every 8 (eight) hours as needed for nausea or vomiting. (Patient not taking: Reported on 01/15/2024) 30 tablet 1 Not Taking   ondansetron  (ZOFRAN -ODT) 4 MG disintegrating tablet Take 1-2 tablets  (4-8 mg total) by mouth every 6 (six) hours as needed for nausea or vomiting. (Patient not taking: Reported on 01/15/2024) 20 tablet 0 Not Taking   Prenatal Vit-Fe Fumarate-FA (PREPLUS) 27-1 MG TABS Take 1 tablet by mouth daily. (Patient not taking: Reported on 01/15/2024) 30 tablet 13 Not Taking    I have reviewed patient's Past Medical Hx, Surgical Hx, Family Hx, Social Hx, medications and allergies.   ROS  Pertinent items noted in HPI and remainder of comprehensive ROS otherwise negative.   PHYSICAL EXAM  Patient Vitals for the past 24 hrs:  BP Temp Temp src Pulse Resp SpO2  01/15/24 2335 -- -- -- -- -- 100 %  01/15/24 2330 -- -- -- -- -- 100 %  01/15/24 2325 -- -- -- -- -- 100 %  01/15/24 2320 -- -- -- -- -- 99 %  01/15/24 2315 -- -- -- -- -- 100 %  01/15/24 2310 -- -- -- -- -- 100 %  01/15/24 2305 -- -- -- -- -- 100 %  01/15/24 2300 -- -- -- -- -- 100 %  01/15/24 2255 -- -- -- -- -- 100 %  01/15/24 2250 -- -- -- -- -- 100 %  01/15/24 2245 -- -- -- -- -- 100 %  01/15/24 2240 -- -- -- -- -- 100 %  01/15/24 2235 -- -- -- -- -- 100 %  01/15/24 2230 -- -- -- -- -- 99 %  01/15/24 2225 -- -- -- -- -- 100 %  01/15/24 2220 -- -- -- -- -- 100 %  01/15/24 2215 -- -- -- -- -- 100 %  01/15/24 2210 -- -- -- -- -- 99 %  01/15/24 2205 -- -- -- -- -- 99 %  01/15/24 2155 -- -- -- -- -- 94 %  01/15/24 2150 -- -- -- -- -- 92 %  01/15/24 2146 (!) 107/58 97.6 F (36.4 C) Axillary 85 14 95 %    Constitutional: Well-developed, well-nourished female. Ill-appearing, unable to sit still, pacing the room HEENT: atraumatic, normocephalic. Neck has normal ROM. EOM intact. Cardiovascular: normal rate & rhythm, warm and well-perfused Respiratory: normal effort, no problems with respiration noted GI: Abd soft, non-tender, non-distended MSK: Extremities nontender, no edema, normal ROM Skin: warm and dry. Acyanotic, no jaundice or pallor. Neurologic: Alert and oriented x 4. No abnormal  coordination. Psychiatric: Normal mood. Speech not slurred, not rapid/pressured. Patient is cooperative. GU: no CVA tenderness  Pelvic exam: VULVA: normal appearing vulva with no masses, tenderness or lesions, VAGINA: normal appearing vagina with normal color, no lesions, watery blood pooled in vaginal canal removed with faux swabs, small <0.5 cm clots removed, CERVIX: multiparous os, no clots or tissue stuck in cervix on visual examination, exam chaperoned by Hunter Stanley RN.      Labs: Results for orders placed or performed during the hospital encounter of 01/15/24 (from the past 24 hours)  Glucose, capillary     Status: Abnormal   Collection Time: 01/15/24  9:16 PM  Result Value Ref Range   Glucose-Capillary 252 (H) 70 - 99 mg/dL  CBC with Differential/Platelet     Status: Abnormal   Collection Time: 01/15/24  9:49 PM  Result Value Ref Range   WBC 14.0 (H) 4.0 - 10.5 K/uL   RBC 3.73 (L) 3.87 - 5.11 MIL/uL   Hemoglobin 10.2 (L) 12.0 - 15.0 g/dL   HCT 69.8 (L) 63.9 - 53.9 %   MCV 80.7 80.0 - 100.0 fL   MCH 27.3 26.0 - 34.0 pg   MCHC 33.9 30.0 - 36.0 g/dL   RDW 86.7 88.4 - 84.4 %   Platelets 230 150 - 400 K/uL   nRBC 0.0 0.0 - 0.2 %   Neutrophils Relative % 76 %   Neutro Abs 10.6 (H) 1.7 - 7.7 K/uL   Lymphocytes Relative 16 %   Lymphs Abs 2.2 0.7 - 4.0 K/uL   Monocytes Relative 7 %   Monocytes Absolute 1.0 0.1 - 1.0 K/uL   Eosinophils Relative 0 %   Eosinophils Absolute 0.0 0.0 - 0.5 K/uL   Basophils Relative 0 %   Basophils Absolute 0.0 0.0 - 0.1 K/uL   Immature Granulocytes 1 %   Abs Immature Granulocytes 0.09 (H) 0.00 - 0.07 K/uL  Comprehensive metabolic panel     Status: Abnormal   Collection Time: 01/15/24  9:49 PM  Result Value Ref Range   Sodium 136 135 - 145 mmol/L   Potassium 3.3 (L) 3.5 - 5.1 mmol/L   Chloride 100 98 - 111 mmol/L   CO2 21 (L) 22 - 32 mmol/L   Glucose, Bld 286 (H) 70 - 99 mg/dL   BUN 14 6 - 20 mg/dL   Creatinine, Ser 9.22 0.44 - 1.00 mg/dL    Calcium 8.9 8.9 - 89.6 mg/dL   Total Protein 6.8 6.5 - 8.1 g/dL   Albumin 3.6 3.5 - 5.0 g/dL   AST 17 15 - 41 U/L   ALT 12 0 - 44 U/L   Alkaline Phosphatase 47 38 - 126 U/L   Total Bilirubin 0.4 0.0 - 1.2 mg/dL   GFR, Estimated >39 >39 mL/min   Anion gap 15 5 - 15  Glucose, capillary     Status: Abnormal   Collection Time: 01/16/24  1:34 AM  Result Value Ref Range   Glucose-Capillary 212 (H) 70 - 99 mg/dL    Imaging:  US  OB LESS THAN 14 WEEKS WITH OB TRANSVAGINAL Result Date: 01/16/2024 TECHNIQUE: Transabdominal and Transvaginal first trimester obstetric pelvic duplex ultrasound was performed with real-time imaging, color flow Doppler imaging, and spectral analysis. CLINICAL HISTORY: Spontaneous abortion, abdominal pain, retained products of conception. COMPARISON: US  Pregnancy Transvaginal 01/09/2024. FINDINGS: UTERUS: Uterus measures 10.1 x 6.0 x 6.8 cm. Endometrium measures 18 mm in thickness with blood flow concerning for retained products of conception. No focal myometrial mass. GESTATIONAL SAC(S): Gestational sac is absent. Previously seen intrauterine gestation is no longer visualized, compatible with spontaneous  abortion. No subchorionic hemorrhage. YOLK SAC: Yolk sac is absent. EMBRYO(<11WK) /FETUS(>=11WK): Embryo is absent. CROWN RUMP LENGTH: Not measured. RATE OF CARDIAC ACTIVITY: Not measured. RIGHT OVARY: Right ovary measures 3.1 x 1.8 x 3.0 cm. LEFT OVARY: Left ovary measures 3.0 x 1.3 x 2.8 cm. FREE FLUID: No free fluid. MEASUREMENTS ESTIMATED GESTATIONAL AGE BY CURRENT ULTRASOUND: Not applicable as intrauterine gestation is no longer visualized. ESTIMATED GESTATIONAL AGE BY LMP/PRIOR ULTRASOUND: Not provided. ESTIMATED DUE DATE: Not applicable as intrauterine gestation is no longer visualized. IMPRESSION: 1. Previously seen intrauterine gestation no longer visualized, compatible with spontaneous abortion. 2. Endometrial thickness of 18 mm with blood flow, concerning for retained  products of conception. Electronically signed by: Franky Crease MD 01/16/2024 01:30 AM EDT RP Workstation: [id number]    MDM & MAU COURSE  MDM: Moderate  MAU Course: -Toradol IM and heating pads for pain relief to allow for IV access, labs, and physical exam. -IV fluids and IV Dilaudid for pain. IV Zofran  for active nausea/vomiting. CBG 252. -CBC and CMP to rule out GI causes of abdominal pain. -CBC with slightly decreased Hgb now 10.2. WBC elevated at 14.0. -CMP with elevated glucose of 286, not surprising given no insulin  for 2 days. Mild hypokalemia. Will administer IV potassium. -Patient reports pain has improved significantly. Agreeable to speculum exam. -Watery blood on speculum exam with small clots. No tissue stuck in cervical os. Will get formal ultrasound to rule out retained products of conception. -Recheck CBG. CBG has improved to 212. -US  with thickened endometrium, concern for retained POC. US  imaging with elevated WBC suggest retained POC is potentially the etiology of patient's abdominal pain. -Discussed with Dr. Izell. Given presentation, US  results, and third MAU visit in a week, recommends D&C. He will discuss with patient at bedside and schedule D&C for this morning. Continue maintenance fluids. Order type and screen, repeat CBG, repeat BMP with GFR. Give doxycycline and metronidazole  preop. -CBG at 185 at 0423.  Orders Placed This Encounter  Procedures   US  OB LESS THAN 14 WEEKS WITH OB TRANSVAGINAL   CBC with Differential/Platelet   Comprehensive metabolic panel   Glucose, capillary   Glucose, capillary   Basic metabolic panel with GFR   Glucose, capillary   Diet NPO time specified   Type and screen [facility]   Meds ordered this encounter  Medications   DISCONTD: ketorolac (TORADOL) 30 MG/ML injection 30 mg   HYDROmorphone (DILAUDID) injection 1 mg   lactated ringers  bolus 1,000 mL   ketorolac (TORADOL) 30 MG/ML injection 30 mg    ondansetron  (ZOFRAN ) 8 mg in sodium chloride  0.9 % 50 mL IVPB   potassium chloride  10 mEq in 100 mL IVPB   lactated ringers  bolus 1,000 mL   lactated ringers  1,000 mL with potassium chloride  40 mEq infusion   DISCONTD: lactated ringers  infusion   doxycycline (VIBRAMYCIN) 100 mg in sodium chloride  0.9 % 250 mL IVPB    Antibiotic Indication::   Other Indication (list below)   metroNIDAZOLE  (FLAGYL ) IVPB 500 mg    Antibiotic Indication::   Pelvic Inflammatory Disease   HYDROmorphone (DILAUDID) injection 1 mg    ASSESSMENT   1. Missed abortion   2. Lower abdominal pain   3. Uncontrolled type 2 diabetes mellitus with hyperglycemia (HCC)   4. Retained products of conception after miscarriage     PLAN  Admit for D&C with Dr. Erik. Dr. Izell discussed management and reviewed risks and benefits of procedure. Patient will remain NPO  until D&C.   Allergies as of 01/15/2024   (No Known Allergies)     Joesph DELENA Sear, PA

## 2024-01-16 ENCOUNTER — Encounter (HOSPITAL_COMMUNITY): Payer: Self-pay | Admitting: Obstetrics and Gynecology

## 2024-01-16 ENCOUNTER — Encounter (HOSPITAL_COMMUNITY)
Admission: AD | Disposition: A | Discharge status: Home / Self Care | Payer: Self-pay | Source: Home / Self Care | Attending: Obstetrics and Gynecology

## 2024-01-16 ENCOUNTER — Inpatient Hospital Stay (HOSPITAL_COMMUNITY): Admitting: Anesthesiology

## 2024-01-16 ENCOUNTER — Telehealth: Payer: Self-pay | Admitting: Obstetrics and Gynecology

## 2024-01-16 ENCOUNTER — Inpatient Hospital Stay (HOSPITAL_COMMUNITY)

## 2024-01-16 DIAGNOSIS — E876 Hypokalemia: Secondary | ICD-10-CM | POA: Diagnosis present

## 2024-01-16 DIAGNOSIS — R739 Hyperglycemia, unspecified: Secondary | ICD-10-CM | POA: Diagnosis present

## 2024-01-16 DIAGNOSIS — Z3A01 Less than 8 weeks gestation of pregnancy: Secondary | ICD-10-CM

## 2024-01-16 DIAGNOSIS — Z3A00 Weeks of gestation of pregnancy not specified: Secondary | ICD-10-CM | POA: Diagnosis not present

## 2024-01-16 DIAGNOSIS — D649 Anemia, unspecified: Secondary | ICD-10-CM | POA: Diagnosis present

## 2024-01-16 DIAGNOSIS — O039 Complete or unspecified spontaneous abortion without complication: Secondary | ICD-10-CM | POA: Diagnosis not present

## 2024-01-16 DIAGNOSIS — O021 Missed abortion: Secondary | ICD-10-CM | POA: Diagnosis not present

## 2024-01-16 DIAGNOSIS — O034 Incomplete spontaneous abortion without complication: Secondary | ICD-10-CM | POA: Diagnosis present

## 2024-01-16 DIAGNOSIS — D72829 Elevated white blood cell count, unspecified: Secondary | ICD-10-CM | POA: Diagnosis present

## 2024-01-16 DIAGNOSIS — E1165 Type 2 diabetes mellitus with hyperglycemia: Secondary | ICD-10-CM | POA: Diagnosis present

## 2024-01-16 HISTORY — PX: DILATION AND EVACUATION: SHX1459

## 2024-01-16 LAB — GLUCOSE, CAPILLARY
Glucose-Capillary: 212 mg/dL — ABNORMAL HIGH (ref 70–99)
Glucose-Capillary: 185 mg/dL — ABNORMAL HIGH (ref 70–99)
Glucose-Capillary: 131 mg/dL — ABNORMAL HIGH (ref 70–99)

## 2024-01-16 LAB — BASIC METABOLIC PANEL WITH GFR
Anion gap: 10 (ref 5–15)
BUN: 11 mg/dL (ref 6–20)
CO2: 25 mmol/L (ref 22–32)
Calcium: 8.3 mg/dL — ABNORMAL LOW (ref 8.9–10.3)
Chloride: 102 mmol/L (ref 98–111)
Creatinine, Ser: 0.54 mg/dL (ref 0.44–1.00)
GFR, Estimated: >60 mL/min (ref >60)
Glucose, Bld: 196 mg/dL — ABNORMAL HIGH (ref 70–99)
Potassium: 3.9 mmol/L (ref 3.5–5.1)
Sodium: 137 mmol/L (ref 135–145)

## 2024-01-16 LAB — TYPE AND SCREEN
ABO/RH(D): O POS
Antibody Screen: NEGATIVE

## 2024-01-16 SURGERY — DILATION AND EVACUATION, UTERUS
Anesthesia: General

## 2024-01-16 MED ORDER — DEXAMETHASONE SOD PHOSPHATE PF 10 MG/ML IJ SOLN
INTRAMUSCULAR | Status: DC | PRN
  Administered 2024-01-16: 10 mg via INTRAVENOUS

## 2024-01-16 MED ORDER — MIDAZOLAM HCL 2 MG/2ML IJ SOLN
INTRAMUSCULAR | Status: AC
Start: 2024-01-16 — End: 2024-01-16
  Filled 2024-01-16: qty 2

## 2024-01-16 MED ORDER — MISOPROSTOL 100 MCG PO TABS
ORAL_TABLET | ORAL | Status: DC | PRN
  Administered 2024-01-16: 800 ug via RECTAL

## 2024-01-16 MED ORDER — MIDAZOLAM HCL (PF) 2 MG/2ML IJ SOLN
INTRAMUSCULAR | Status: DC | PRN
  Administered 2024-01-16: 2 mg via INTRAVENOUS

## 2024-01-16 MED ORDER — LACTATED RINGERS IV SOLN
INTRAVENOUS | Status: DC
Start: 2024-01-16 — End: 2024-01-16

## 2024-01-16 MED ORDER — POTASSIUM CHLORIDE 2 MEQ/ML IV SOLN
INTRAVENOUS | Status: DC
  Filled 2024-01-16: qty 1000

## 2024-01-16 MED ORDER — FENTANYL CITRATE (PF) 100 MCG/2ML IJ SOLN: 25.0000 ug | INTRAMUSCULAR | Status: DC | PRN

## 2024-01-16 MED ORDER — PROPOFOL 10 MG/ML IV BOLUS
INTRAVENOUS | Status: AC
  Filled 2024-01-16: qty 20

## 2024-01-16 MED ORDER — SILVER NITRATE-POT NITRATE 75-25 % EX MISC
CUTANEOUS | Status: DC | PRN
  Administered 2024-01-16: 3 via TOPICAL

## 2024-01-16 MED ORDER — SUGAMMADEX SODIUM 200 MG/2ML IV SOLN
INTRAVENOUS | Status: DC | PRN
  Administered 2024-01-16: 200 mg via INTRAVENOUS

## 2024-01-16 MED ORDER — DOXYCYCLINE HYCLATE 100 MG IV SOLR
200.0000 mg | INTRAVENOUS | Status: AC
  Administered 2024-01-16: 200 mg via INTRAVENOUS
  Filled 2024-01-16: qty 200

## 2024-01-16 MED ORDER — FENTANYL CITRATE (PF) 250 MCG/5ML IJ SOLN
INTRAMUSCULAR | Status: DC | PRN
  Administered 2024-01-16 (×2): 50 ug via INTRAVENOUS

## 2024-01-16 MED ORDER — HYDROMORPHONE HCL 1 MG/ML IJ SOLN
1.0000 mg | Freq: Once | INTRAMUSCULAR | Status: AC
  Administered 2024-01-16: 1 mg via INTRAVENOUS
  Filled 2024-01-16: qty 1

## 2024-01-16 MED ORDER — ROCURONIUM BROMIDE 10 MG/ML (PF) SYRINGE
PREFILLED_SYRINGE | INTRAVENOUS | Status: DC | PRN
  Administered 2024-01-16: 30 mg via INTRAVENOUS

## 2024-01-16 MED ORDER — ACETAMINOPHEN 500 MG PO TABS
1000.0000 mg | ORAL_TABLET | Freq: Once | ORAL | Status: AC
  Administered 2024-01-16: 1000 mg via ORAL
  Filled 2024-01-16: qty 2

## 2024-01-16 MED ORDER — 0.9 % SODIUM CHLORIDE (POUR BTL) OPTIME
TOPICAL | Status: DC | PRN
  Administered 2024-01-16: 1000 mL

## 2024-01-16 MED ORDER — METHYLERGONOVINE MALEATE 0.2 MG/ML IJ SOLN
INTRAMUSCULAR | Status: AC
  Filled 2024-01-16: qty 1

## 2024-01-16 MED ORDER — ONDANSETRON HCL 4 MG/2ML IJ SOLN: 4.0000 mg | Freq: Once | INTRAMUSCULAR | Status: DC | PRN

## 2024-01-16 MED ORDER — IBUPROFEN 800 MG PO TABS: 800.0000 mg | ORAL_TABLET | Freq: Three times a day (TID) | ORAL | 1 refills | Status: AC | PRN

## 2024-01-16 MED ORDER — PROPOFOL 10 MG/ML IV BOLUS
INTRAVENOUS | Status: DC | PRN
  Administered 2024-01-16: 140 mg via INTRAVENOUS

## 2024-01-16 MED ORDER — ORAL CARE MOUTH RINSE: 15.0000 mL | Freq: Once | OROMUCOSAL | Status: AC

## 2024-01-16 MED ORDER — KETOROLAC TROMETHAMINE 30 MG/ML IJ SOLN
30.0000 mg | Freq: Once | INTRAMUSCULAR | Status: AC
  Administered 2024-01-16: 30 mg via INTRAVENOUS
  Filled 2024-01-16: qty 1

## 2024-01-16 MED ORDER — AMISULPRIDE (ANTIEMETIC) 5 MG/2ML IV SOLN: 10.0000 mg | Freq: Once | INTRAVENOUS | Status: DC | PRN

## 2024-01-16 MED ORDER — INSULIN ASPART 100 UNIT/ML IJ SOLN: 0.0000 [IU] | INTRAMUSCULAR | Status: DC | PRN

## 2024-01-16 MED ORDER — FENTANYL CITRATE (PF) 100 MCG/2ML IJ SOLN
INTRAMUSCULAR | Status: AC
  Filled 2024-01-16: qty 2

## 2024-01-16 MED ORDER — OXYCODONE HCL 5 MG PO TABS
5.0000 mg | ORAL_TABLET | ORAL | 0 refills | Status: AC | PRN
Start: 2024-01-16 — End: ?

## 2024-01-16 MED ORDER — MISOPROSTOL 200 MCG PO TABS
ORAL_TABLET | ORAL | Status: AC
  Filled 2024-01-16: qty 1

## 2024-01-16 MED ORDER — CHLORHEXIDINE GLUCONATE 0.12 % MT SOLN
OROMUCOSAL | Status: AC
  Filled 2024-01-16: qty 15

## 2024-01-16 MED ORDER — SUCCINYLCHOLINE CHLORIDE 200 MG/10ML IV SOSY
PREFILLED_SYRINGE | INTRAVENOUS | Status: DC | PRN
  Administered 2024-01-16: 100 mg via INTRAVENOUS

## 2024-01-16 MED ORDER — LIDOCAINE 2% (20 MG/ML) 5 ML SYRINGE
INTRAMUSCULAR | Status: DC | PRN
  Administered 2024-01-16: 60 mg via INTRAVENOUS

## 2024-01-16 MED ORDER — PHENYLEPHRINE 80 MCG/ML (10ML) SYRINGE FOR IV PUSH (FOR BLOOD PRESSURE SUPPORT)
PREFILLED_SYRINGE | INTRAVENOUS | Status: DC | PRN
  Administered 2024-01-16 (×2): 80 ug via INTRAVENOUS

## 2024-01-16 MED ORDER — PROPOFOL 10 MG/ML IV BOLUS
INTRAVENOUS | Status: AC
Start: 2024-01-16 — End: 2024-01-16
  Filled 2024-01-16: qty 20

## 2024-01-16 MED ORDER — METHYLERGONOVINE MALEATE 0.2 MG/ML IJ SOLN
INTRAMUSCULAR | Status: DC | PRN
  Administered 2024-01-16: .2 mg via INTRAMUSCULAR

## 2024-01-16 MED ORDER — LACTATED RINGERS IV SOLN: INTRAVENOUS | Status: DC | PRN

## 2024-01-16 MED ORDER — ONDANSETRON HCL 4 MG/2ML IJ SOLN
INTRAMUSCULAR | Status: DC | PRN
  Administered 2024-01-16: 4 mg via INTRAVENOUS

## 2024-01-16 MED ORDER — SODIUM CHLORIDE 0.9 % IV SOLN
100.0000 mg | Freq: Once | INTRAVENOUS | Status: AC
  Administered 2024-01-16: 100 mg via INTRAVENOUS
  Filled 2024-01-16: qty 100

## 2024-01-16 MED ORDER — METRONIDAZOLE 500 MG/100ML IV SOLN
500.0000 mg | Freq: Once | INTRAVENOUS | Status: AC
  Administered 2024-01-16: 500 mg via INTRAVENOUS
  Filled 2024-01-16: qty 100

## 2024-01-16 MED ORDER — CHLORHEXIDINE GLUCONATE 0.12 % MT SOLN
15.0000 mL | Freq: Once | OROMUCOSAL | Status: AC
  Administered 2024-01-16: 15 mL via OROMUCOSAL

## 2024-01-16 SURGICAL SUPPLY — 16 items
CATH ROBINSON RED A/P 16FR (CATHETERS) ×1 IMPLANT
FILTER UTR ASPR ASSEMBLY (MISCELLANEOUS) ×1 IMPLANT
GLOVE BIO SURGEON STRL SZ7 (GLOVE) ×1 IMPLANT
GLOVE BIOGEL PI IND STRL 7.0 (GLOVE) ×1 IMPLANT
GOWN STRL REUS W/ TWL XL LVL3 (GOWN DISPOSABLE) ×1 IMPLANT
HOSE CONNECTING 18IN BERKELEY (TUBING) ×1 IMPLANT
KIT BERKELEY 1ST TRI 3/8 NO TR (MISCELLANEOUS) ×1 IMPLANT
KIT BERKELEY 1ST TRIMESTER 3/8 (MISCELLANEOUS) ×1 IMPLANT
PACK VAGINAL MINOR WOMEN LF (CUSTOM PROCEDURE TRAY) ×1 IMPLANT
PAD OB MATERNITY 11 LF (PERSONAL CARE ITEMS) ×1 IMPLANT
SET BERKELEY SUCTION TUBING (SUCTIONS) ×1 IMPLANT
SOLN 0.9% NACL POUR BTL 1000ML (IV SOLUTION) ×1 IMPLANT
TOWEL GREEN STERILE FF (TOWEL DISPOSABLE) ×1 IMPLANT
UNDERPAD 30X36 HEAVY ABSORB (UNDERPADS AND DIAPERS) ×1 IMPLANT
VACURETTE 7MM CVD STRL WRAP (CANNULA) IMPLANT
VACURETTE 8 RIGID CVD (CANNULA) IMPLANT

## 2024-01-16 NOTE — Progress Notes (Signed)
 Pt transported to main OR via transport on stretcher bed accompanied by support person in stable condition.

## 2024-01-16 NOTE — H&P (Addendum)
 Obstetrics & Gynecology H&P   Date of Admission: 01/16/2024   Requesting Provider: Maternity Admissions Unit  Primary OBGYN: [facility] Primary Care Provider: Rosalea Rosina SAILOR  Reason for Admission: retained POCs s/p failed medical management of  threatened AB  History of Present Illness: [name]  is a 33 y.o. [id number] (No LMP recorded.), with the above CC. PMHx is significant for uncontrolled DM2.  Patient seen on 10/25 and had VB and cramping and diagnosed with embryonic demise based on no cardiac motion with CRL of 28mm. Pt elected for cytotec  which was given then. She was seen on 10/29 for pain and given PO pain meds. No labs or rpt u/s done. Pt presents today with n/v and pain. MAU work up shows endometrial stripe of 18mm with vascularity and BS >200, bicarb 21 and anion gap and slight hypokalemia and WBC of 14.   Patient currently comfortable after pain meds. 2L fluids given and 20 of IV K  Patient states she hasn't taken her insulin  in about a day due to the nausea and throwing up.   ROS: A 12-point review of systems was performed and negative, except as stated in the above HPI.  OBGYN History: As per HPI. OB History  Gravida Para Term Preterm AB Living  5 2 2  0 2 2  SAB IAB Ectopic Multiple Live Births  1 1 0 0 2    # Outcome Date GA Lbr Len/2nd Weight Sex Type Anes PTL Lv  5 SAB 2022     SAB     4 Term 08/03/16 [redacted]w[redacted]d 02:50 / 00:22 3731 g F Vag-Spont EPI  LIV  3 Term 03/17/10 [redacted]w[redacted]d   F Vag-Spont   LIV  2 Gravida           1 IAB            Past Medical History: Past Medical History:  Diagnosis Date   Gestational diabetes    metformin    Medical history non-contributory    Short cervix    Type 2 diabetes mellitus (HCC)    Past Surgical History: Past Surgical History:  Procedure Laterality Date   INDUCED ABORTION     Family History:  Family History  Problem Relation Age of Onset   Hypertension Mother    Diabetes Mother    Breast cancer Mother    Cancer  Maternal Grandmother    Diabetes Maternal Grandmother    Stroke Maternal Grandmother    Cancer Maternal Grandfather    Social History:  Social History   Socioeconomic History   Marital status: Single    Spouse name: Not on file   Number of children: Not on file   Years of education: Not on file   Highest education level: Not on file  Occupational History   Not on file  Tobacco Use   Smoking status: Former    Types: Cigars   Smokeless tobacco: Never  Vaping Use   Vaping status: Never Used  Substance and Sexual Activity   Alcohol use: No    Comment: Occasionally   Drug use: No   Sexual activity: Yes    Birth control/protection: None  Other Topics Concern   Not on file  Social History Narrative   Not on file   Social Drivers of Health   Financial Resource Strain: Not on file  Food Insecurity: Not on file  Transportation Needs: Not on file  Physical Activity: Not on file  Stress: Not on file  Social Connections: Not on file  Intimate Partner Violence: Not on file   Allergy: No Known Allergies Current Outpatient Medications: Medications Prior to Admission  Medication Sig Dispense Refill Last Dose/Taking   ibuprofen  (ADVIL ) 800 MG tablet Take 1 tablet (800 mg total) by mouth every 6 (six) hours as needed for moderate pain (pain score 4-6). 21 tablet 0 01/15/2024   insulin  glargine (LANTUS ) 100 UNIT/ML Solostar Pen Inject 20 Units into the skin at bedtime. 6 mL 2 Past Week   metroNIDAZOLE  (FLAGYL ) 500 MG tablet Take 1 tablet (500 mg total) by mouth 2 (two) times daily. 14 tablet 0 01/14/2024   oxyCODONE -acetaminophen  (PERCOCET/ROXICET) 5-325 MG tablet Take 1-2 tablets by mouth every 6 (six) hours as needed. 10 tablet 0 01/15/2024   Blood Glucose Monitoring Suppl DEVI 1 each by Does not apply route in the morning, at noon, and at bedtime. May substitute to any manufacturer covered by patient's insurance. 1 each 0    Blood Pressure Monitoring (BLOOD PRESSURE KIT) DEVI 1  Device by Does not apply route once a week. 1 each 0    famotidine (PEPCID) 20 MG tablet Take 1 tablet (20 mg total) by mouth 2 (two) times daily. (Patient not taking: Reported on 01/15/2024) 180 tablet 3 Not Taking   Insulin  Pen Needle (BD PEN NEEDLE NANO 2ND GEN) 32G X 4 MM MISC 1 Units by Does not apply route daily. 30 each 12    metFORMIN  (GLUCOPHAGE ) 500 MG tablet Take 1 tablet (500 mg total) by mouth 2 (two) times daily with a meal. Start with 500 mg BID then increase to 1000 mg BID after 7 days (Patient not taking: Reported on 01/15/2024) 90 tablet 1 Not Taking   misoprostol  (CYTOTEC ) 200 MCG tablet Take 4 tablets (800 mcg total) by mouth once for 1 dose. 4 tablet 0    ondansetron  (ZOFRAN ) 4 MG tablet Take 1 tablet (4 mg total) by mouth every 8 (eight) hours as needed for nausea or vomiting. (Patient not taking: Reported on 01/15/2024) 30 tablet 1 Not Taking   ondansetron  (ZOFRAN -ODT) 4 MG disintegrating tablet Take 1-2 tablets (4-8 mg total) by mouth every 6 (six) hours as needed for nausea or vomiting. (Patient not taking: Reported on 01/15/2024) 20 tablet 0 Not Taking   Prenatal Vit-Fe Fumarate-FA (PREPLUS) 27-1 MG TABS Take 1 tablet by mouth daily. (Patient not taking: Reported on 01/15/2024) 30 tablet 13 Not Taking   Hospital Medications: Current Facility-Administered Medications  Medication Dose Route Frequency Provider Last Rate Last Admin   doxycycline (VIBRAMYCIN) 100 mg in sodium chloride  0.9 % 250 mL IVPB  100 mg Intravenous Once Izell Harari, MD       lactated ringers  1,000 mL with potassium chloride  40 mEq infusion   Intravenous Continuous Izell Harari, MD       metroNIDAZOLE  (FLAGYL ) IVPB 500 mg  500 mg Intravenous Once Izell Harari, MD        Physical Exam:   Current Vital Signs 24h Vital Sign Ranges  T 97.6 F (36.4 C) Temp  Avg: 97.6 F (36.4 C)  Min: 97.6 F (36.4 C)  Max: 97.6 F (36.4 C)  BP (!) 107/58 BP  Min: 107/58  Max: 107/58  HR 85 Pulse  Avg: 85   Min: 85  Max: 85  RR 14 Resp  Avg: 14  Min: 14  Max: 14  SaO2 100 % Room Air SpO2  Avg: 99 %  Min: 92 %  Max: 100 %       24 Hour I/O  Current Shift I/O  Time Ins Outs No intake/output data recorded. No intake/output data recorded.   General appearance: Well nourished, well developed female in no acute distress.  Cardiovascular: S1, S2 normal, no murmur, rub or gallop, regular rate and rhythm Respiratory:  Clear to auscultation bilateral. Normal respiratory effort Abdomen: soft, nttp, nd Neuro/Psych:  Normal mood and affect.  Skin:  Warm and dry.  Extremities: no clubbing, cyanosis, or edema.   Laboratory: CBG 0134 212  Recent Labs  Lab 01/15/24 2149  WBC 14.0*  HGB 10.2*  HCT 30.1*  PLT 230   Recent Labs  Lab 01/15/24 2149  NA 136  K 3.3*  CL 100  CO2 21*  BUN 14  CREATININE 0.77  CALCIUM 8.9  PROT 6.8  BILITOT 0.4  ALKPHOS 47  ALT 12  AST 17  GLUCOSE 286*   Imaging:  Narrative & Impression  TECHNIQUE: Transabdominal and Transvaginal first trimester obstetric pelvic duplex ultrasound was performed with real-time imaging, color flow Doppler imaging, and spectral analysis.   CLINICAL HISTORY: Spontaneous abortion, abdominal pain, retained products of conception.   COMPARISON: US  Pregnancy Transvaginal 01/09/2024.   FINDINGS:   UTERUS: Uterus measures 10.1 x 6.0 x 6.8 cm. Endometrium measures 18 mm in thickness with blood flow concerning for retained products of conception. No focal myometrial mass.   GESTATIONAL SAC(S): Gestational sac is absent. Previously seen intrauterine gestation is no longer visualized, compatible with spontaneous abortion. No subchorionic hemorrhage.   YOLK SAC: Yolk sac is absent.   EMBRYO(<11WK) /FETUS(>=11WK): Embryo is absent.   CROWN RUMP LENGTH: Not measured.   RATE OF CARDIAC ACTIVITY: Not measured.   RIGHT OVARY: Right ovary measures 3.1 x 1.8 x 3.0 cm.   LEFT OVARY: Left ovary measures 3.0 x 1.3 x 2.8  cm.   FREE FLUID: No free fluid.   MEASUREMENTS   ESTIMATED GESTATIONAL AGE BY CURRENT ULTRASOUND: Not applicable as intrauterine gestation is no longer visualized.   ESTIMATED GESTATIONAL AGE BY LMP/PRIOR ULTRASOUND: Not provided.   ESTIMATED DUE DATE: Not applicable as intrauterine gestation is no longer visualized.   IMPRESSION: 1. Previously seen intrauterine gestation no longer visualized, compatible with spontaneous abortion. 2. Endometrial thickness of 18 mm with blood flow, concerning for retained products of conception.   Electronically signed by: Franky Crease MD 01/16/2024 01:30 AM EDT RP Workstation: [id number]   Assessment: patient stable  Plan: D/w her recommend suction d&c given she's three times in a week, elevated WBC and has already tried cytotec . D/w her re: risks of surgery and she is amenable to proceeding. Pt posted for 1115am this morning. Will continue to correct electrolytes, BS in the interim. NPO with MIVF and will do a dose of doxy and flagyl   Total time taking care of the patient was 40 minutes, with greater than 50% of the time spent in face to face interaction with the patient.  Bebe Izell Raddle MD Attending Center for [facility]) GYN Consult Phone: [phone] (M-F, [phone]) & [phone] (Off hours, weekends, holidays)   Patient seen and examined. She was diagnosed with a miscarriage and took cytotec , has continued bleeding and significant pain. Has been unable to tolerate meds due to pain. Found to have passed gestational sac but had retained products of conception with 18 mm stripe. Increased WBC last night, recommended for surgical management with suction D&C for retained products of conception.   The risks of suction D&C were reviewed with the patient; including but not limited to: infection  which may require antibiotics; bleeding which may require transfusion or re-operation; injury to surrounding organs;  need for additional procedures in the event of a life-threatening hemorrhage; placental abnormalities wth subsequent pregnancies, thromboembolic phenomenon and other postoperative/anesthesia complications. Reviewed that all sampling will be sent to pathology and further management based on findings. The patient concurred with the proposed plan, giving informed consent for the procedure. She is agreeable to a blood transfusion in the event of emergency.  NPO Anesthesia aware Preoperative prophylactic antibiotics ordered - doxycycline SCDs  To OR when ready Rh positive Flagyl  for clue cells on wet prep   K. Yolanda Moats, MD, [facility] Attending Center for [facility])

## 2024-01-16 NOTE — Telephone Encounter (Signed)
 Called patient for follow up after surgery, no answer, will try again.  LOIS Yolanda Moats, MD, [facility] Attending Center for [facility])

## 2024-01-16 NOTE — Anesthesia Procedure Notes (Signed)
 Procedure Name: Intubation Date/Time: 01/16/2024 11:42 AM  Performed by: Arvell Edsel HERO, CRNAPre-anesthesia Checklist: Patient identified, Emergency Drugs available, Suction available, Patient being monitored and Timeout performed Patient Re-evaluated:Patient Re-evaluated prior to induction Oxygen Delivery Method: Circle system utilized Preoxygenation: Pre-oxygenation with 100% oxygen Induction Type: IV induction and Rapid sequence Laryngoscope Size: Mac and 3 Grade View: Grade II Tube size: 7.0 mm Number of attempts: 1 Airway Equipment and Method: Patient positioned with wedge pillow and Stylet Placement Confirmation: ETT inserted through vocal cords under direct vision, positive ETCO2 and breath sounds checked- equal and bilateral Secured at: 22 cm Tube secured with: Tape

## 2024-01-16 NOTE — Discharge Summary (Signed)
 Physician Discharge Summary  Patient ID: Kelsey Jones  MRN: [id number] DOB/AGE: 1990/03/21 33 y.o.  Admit date: 01/15/2024 Discharge date: 01/16/2024  Admission Diagnoses: retained products of conception, failed medical management  Discharge Diagnoses:  Active Problems:   Poorly controlled type 2 diabetes mellitus (HCC)   Retained products of conception after miscarriage   Leukocytosis   Hypokalemia   Hyperglycemia   Acute anemia   Discharged Condition: good  Hospital Course: Please see HPI dated 01/15/2024 for full details. Briefly, this is a 33 y.o. [id number] female observed overnight for retained products of conception, recommended for surgical management. She underwent suction D&C on 01/16/24 and was discharged to home same day after recovery.   She was deemed stable for discharge, instructions for follow up given. She will follow up in office in 2 weeks.  Medical history significant for uncontrolled Type 2 DM.  Physical Exam:  BP 114/76 (BP Location: Right Arm)   Pulse 83   Temp 98.6 F (37 C)   Resp 16   Ht 5' 7.01 (1.702 m)   Wt 73.5 kg   SpO2 100%   BMI 25.38 kg/m  General: alert, oriented, cooperative Chest: normal respiratory effort Heart: regular rate Abdomen: soft, appropriately tender to palpation  DVT Evaluation: no evidence of DVT Extremities: no edema, no calf tenderness   Labs: Lab Results  Component Value Date   WBC 14.0 (H) 01/15/2024   HGB 10.2 (L) 01/15/2024   HCT 30.1 (L) 01/15/2024   MCV 80.7 01/15/2024   PLT 230 01/15/2024      Latest Ref Rng & Units 01/16/2024    4:22 AM  CMP  Glucose 70 - 99 mg/dL 803   BUN 6 - 20 mg/dL 11   Creatinine 9.55 - 1.00 mg/dL 9.45   Sodium 864 - 854 mmol/L 137   Potassium 3.5 - 5.1 mmol/L 3.9   Chloride 98 - 111 mmol/L 102   CO2 22 - 32 mmol/L 25   Calcium 8.9 - 10.3 mg/dL 8.3       Disposition: Discharge disposition: 01-Home or Self Care       Discharge Instructions     Call  MD for:  difficulty breathing, headache or visual disturbances   Complete by: As directed    Call MD for:  extreme fatigue   Complete by: As directed    Call MD for:  hives   Complete by: As directed    Call MD for:  persistant dizziness or light-headedness   Complete by: As directed    Call MD for:  persistant nausea and vomiting   Complete by: As directed    Call MD for:  redness, tenderness, or signs of infection (pain, swelling, redness, odor or green/yellow discharge around incision site)   Complete by: As directed    Call MD for:  severe uncontrolled pain   Complete by: As directed    Call MD for:  temperature >100.4   Complete by: As directed    Diet - low sodium heart healthy   Complete by: As directed    Discharge wound care:   Complete by: As directed    Wash with warm, soapy water. Pat dry, do not scrub.   Increase activity slowly   Complete by: As directed    May shower / Bathe   Complete by: As directed       An After Visit Summary was printed and given to the patient. Allergies as of 01/16/2024   No Known Allergies  Medication List     STOP taking these medications    metroNIDAZOLE  500 MG tablet Commonly known as: FLAGYL    misoprostol  200 MCG tablet Commonly known as: Cytotec    ondansetron  4 MG disintegrating tablet Commonly known as: ZOFRAN -ODT   ondansetron  4 MG tablet Commonly known as: Zofran    PrePLUS 27-1 MG Tabs       TAKE these medications    BD Pen Needle Nano 2nd Gen 32G X 4 MM Misc Generic drug: Insulin  Pen Needle 1 Units by Does not apply route daily.   Blood Glucose Monitoring Suppl Devi 1 each by Does not apply route in the morning, at noon, and at bedtime. May substitute to any manufacturer covered by patient's insurance.   Blood Pressure Kit Devi 1 Device by Does not apply route once a week.   famotidine 20 MG tablet Commonly known as: PEPCID Take 1 tablet (20 mg total) by mouth 2 (two) times daily.   ibuprofen   800 MG tablet Commonly known as: ADVIL  Take 1 tablet (800 mg total) by mouth every 8 (eight) hours as needed for moderate pain (pain score 4-6). What changed: when to take this   insulin  glargine 100 UNIT/ML Solostar Pen Commonly known as: LANTUS  Inject 20 Units into the skin at bedtime.   metFORMIN  500 MG tablet Commonly known as: GLUCOPHAGE  Take 1 tablet (500 mg total) by mouth 2 (two) times daily with a meal. Start with 500 mg BID then increase to 1000 mg BID after 7 days   oxyCODONE  5 MG immediate release tablet Commonly known as: Roxicodone  Take 1 tablet (5 mg total) by mouth every 4 (four) hours as needed for severe pain (pain score 7-10).   oxyCODONE -acetaminophen  5-325 MG tablet Commonly known as: PERCOCET/ROXICET Take 1-2 tablets by mouth every 6 (six) hours as needed.               Discharge Care Instructions  (From admission, onward)           Start     Ordered   01/16/24 0000  Discharge wound care:       Comments: Wash with warm, soapy water. Pat dry, do not scrub.   01/16/24 1223            Follow-up Information     [facility]. Schedule an appointment as soon as possible for a visit in 2 week(s).   Specialty: Obstetrics and Gynecology Contact information: [address] [place]  [address] [phone]                Signed: Burnard CHRISTELLA Moats 01/16/2024, 12:27 PM

## 2024-01-16 NOTE — Transfer of Care (Signed)
 Immediate Anesthesia Transfer of Care Note  Patient: Kelsey Jones   Procedure(s) Performed: DILATION AND EVACUATION, UTERUS  Patient Location: PACU  Anesthesia Type:General  Level of Consciousness: awake, alert , and patient cooperative  Airway & Oxygen Therapy: Patient Spontanous Breathing and Patient connected to face mask oxygen  Post-op Assessment: Report given to RN, Post -op Vital signs reviewed and stable, Patient moving all extremities, Patient moving all extremities X 4, and Patient able to stick tongue midline  Post vital signs: Reviewed and stable  Last Vitals:  Vitals Value Taken Time  BP 114/76 01/16/24 12:21  Temp 37 C 01/16/24 12:21  Pulse 105 01/16/24 12:25  Resp 15 01/16/24 12:25  SpO2 100 % 01/16/24 12:25  Vitals shown include unfiled device data.  Last Pain:  Vitals:   01/16/24 0959  TempSrc: Oral  PainSc:          Complications: No notable events documented.

## 2024-01-16 NOTE — Anesthesia Preprocedure Evaluation (Signed)
 Anesthesia Evaluation  Patient identified by MRN, date of birth, ID band Patient awake    Reviewed: Allergy & Precautions, NPO status , Patient's Chart, lab work & pertinent test results  Airway Mallampati: II  TM Distance: >3 FB Neck ROM: Full    Dental  (+) Teeth Intact, Dental Advisory Given   Pulmonary former smoker   Pulmonary exam normal breath sounds clear to auscultation       Cardiovascular negative cardio ROS Normal cardiovascular exam Rhythm:Regular Rate:Normal     Neuro/Psych negative neurological ROS     GI/Hepatic Neg liver ROS,GERD  Medicated,,  Endo/Other  diabetes, Type 2, Insulin  Dependent    Renal/GU negative Renal ROS     Musculoskeletal negative musculoskeletal ROS (+)    Abdominal   Peds  Hematology  (+) Blood dyscrasia, anemia   Anesthesia Other Findings Day of surgery medications reviewed with the patient.  Reproductive/Obstetrics (+) Pregnancy (Missed abortion)                              Anesthesia Physical Anesthesia Plan  ASA: 2  Anesthesia Plan: General   Post-op Pain Management:    Induction: Intravenous, Rapid sequence and Cricoid pressure planned  PONV Risk Score and Plan: 4 or greater and Midazolam, Dexamethasone and Ondansetron   Airway Management Planned: Oral ETT  Additional Equipment:   Intra-op Plan:   Post-operative Plan: Extubation in OR  Informed Consent: I have reviewed the patients History and Physical, chart, labs and discussed the procedure including the risks, benefits and alternatives for the proposed anesthesia with the patient or authorized representative who has indicated his/her understanding and acceptance.     Dental advisory given  Plan Discussed with: CRNA  Anesthesia Plan Comments:          Anesthesia Quick Evaluation

## 2024-01-16 NOTE — Op Note (Signed)
 Aimi S Churilla  PROCEDURE DATE:  01/16/2024  PREOPERATIVE DIAGNOSIS: retained products of conception after first trimester spontaneous abortion, failed medical management POSTOPERATIVE DIAGNOSIS: The same PROCEDURE: suction dilation and curettage  SURGEON:  Dr. LOIS Yolanda Moats  INDICATIONS: 33 y.o.  [id number] presenting with bleeding s/p incomplete abortion with retained products of conception, recommended for surgical management.  Risks of surgery were discussed with the patient including but not limited to: bleeding which may require transfusion; infection which may require antibiotics; injury to uterus or surrounding organs; need for additional procedures including laparotomy or laparoscopy; possibility of intrauterine scarring which may impair future fertility; and other postoperative/anesthesia complications. Written informed consent was obtained.  ABO, Rh: --/--/O POS (11/01 0416) CBC    Component Value Date/Time   WBC 14.0 (H) 01/15/2024 2149   RBC 3.73 (L) 01/15/2024 2149   HGB 10.2 (L) 01/15/2024 2149   HGB 11.3 05/15/2016 1115   HCT 30.1 (L) 01/15/2024 2149   HCT 33.9 (L) 05/15/2016 1115   PLT 230 01/15/2024 2149   PLT 186 05/15/2016 1115   MCV 80.7 01/15/2024 2149   MCV 80 05/15/2016 1115   MCH 27.3 01/15/2024 2149   MCHC 33.9 01/15/2024 2149   RDW 13.2 01/15/2024 2149   RDW 15.5 (H) 05/15/2016 1115   LYMPHSABS 2.2 01/15/2024 2149   LYMPHSABS 1.8 01/09/2016 1603   MONOABS 1.0 01/15/2024 2149   EOSABS 0.0 01/15/2024 2149   EOSABS 0.2 01/09/2016 1603   BASOSABS 0.0 01/15/2024 2149   BASOSABS 0.0 01/09/2016 1603    FINDINGS:   Significant amount of retained products of conception within uterus.    ANESTHESIA:   General anesthesia INTRAVENOUS FLUIDS:  600 mL of LR ESTIMATED BLOOD LOSS:  100 mL URINE OUTPUT: unmeasured clear yellow urine SPECIMENS:  Products of conception sent to pathology COMPLICATIONS:  None immediate.  PROCEDURE DETAILS:  The patient received  intravenous Doxycycline while in the preoperative area.  She was then taken to the operating room where anesthesia was administered and was found to be adequate.  After an adequate timeout was performed, she was placed in the dorsal lithotomy position and examined; then prepped and draped in the sterile manner.   Her bladder was catheterized for return of clear, yellow urine. A vaginal speculum was then placed in the patient's vagina and a single tooth tenaculum was applied to the anterior lip of the cervix.  The cervix was gently dilated to accommodate a 7 mm suction curette. The suction curette was gently advanced to the uterine fundus and activated. The curette was slowly rotated to clear the uterus of products of conception.  The suction curette was switched to an 8 mm to facilitate more products of conception than anticipated, this easily facilitated removal of a 2 cm clump of retained POC. This was repeated until the endometrial cavity was cleared. A sharp curettage was then performed to confirm complete emptying of the uterus. At this point the procedure was finished. Patient given methergine and 800 mcg rectal cytotec  for some brisk bleeding from os that slowed quickly. There was minimal bleeding noted at the end of the procedure, and the tenaculum removed with good hemostasis noted at the tenaculum site with application of silver nitrate.  All instruments were removed from the patient's vagina.  Sponge and instrument counts were correct times three.  The patient tolerated the procedure well and was taken to the recovery area awake, extubated and in stable condition.  The patient will be discharged to home as per  PACU criteria.  Routine postoperative instructions given.  She was prescribed oxycodone , Ibuprofen .  She will follow up in the clinic in 2-3 weeks for postoperative evaluation.   LOIS Yolanda Moats, MD, [facility] Attending Center for [facility])

## 2024-01-16 NOTE — Anesthesia Postprocedure Evaluation (Signed)
 Anesthesia Post Note  Patient: Kelsey Jones   Procedure(s) Performed: DILATION AND EVACUATION, UTERUS     Patient location during evaluation: PACU Anesthesia Type: General Level of consciousness: awake and alert Pain management: pain level controlled Vital Signs Assessment: post-procedure vital signs reviewed and stable Respiratory status: spontaneous breathing, nonlabored ventilation and respiratory function stable Cardiovascular status: blood pressure returned to baseline and stable Postop Assessment: no apparent nausea or vomiting Anesthetic complications: no   No notable events documented.  Last Vitals:  Vitals:   01/16/24 1300 01/16/24 1311  BP: (!) 122/94   Pulse: 90   Resp: 16   Temp:  36.6 C  SpO2: 100%     Last Pain:  Vitals:   01/16/24 1245  TempSrc:   PainSc: 0-No pain                 Garnette [name]

## 2024-01-17 ENCOUNTER — Encounter (HOSPITAL_COMMUNITY): Payer: Self-pay | Admitting: Obstetrics and Gynecology

## 2024-01-18 ENCOUNTER — Encounter: Payer: Self-pay | Admitting: Obstetrics and Gynecology

## 2024-01-19 ENCOUNTER — Ambulatory Visit: Payer: Self-pay | Admitting: Obstetrics and Gynecology

## 2024-01-19 LAB — SURGICAL PATHOLOGY

## 2024-01-25 ENCOUNTER — Ambulatory Visit: Admitting: Obstetrics and Gynecology

## 2024-01-25 ENCOUNTER — Other Ambulatory Visit (HOSPITAL_COMMUNITY): Payer: Self-pay

## 2024-01-25 MED ORDER — FLUZONE 0.5 ML IM SUSY
0.5000 mL | PREFILLED_SYRINGE | Freq: Once | INTRAMUSCULAR | 0 refills | Status: AC
  Filled 2024-01-25: qty 0.5, 1d supply, fill #0

## 2024-02-03 ENCOUNTER — Other Ambulatory Visit (HOSPITAL_COMMUNITY): Payer: Self-pay

## 2024-03-28 ENCOUNTER — Telehealth

## 2024-03-28 DIAGNOSIS — B3731 Acute candidiasis of vulva and vagina: Secondary | ICD-10-CM

## 2024-03-29 MED ORDER — FLUCONAZOLE 150 MG PO TABS: ORAL_TABLET | ORAL | 0 refills | Status: DC

## 2024-03-29 NOTE — Progress Notes (Signed)

## 2024-03-31 ENCOUNTER — Other Ambulatory Visit: Payer: Self-pay

## 2024-03-31 DIAGNOSIS — B3731 Acute candidiasis of vulva and vagina: Secondary | ICD-10-CM

## 2024-03-31 MED ORDER — FLUCONAZOLE 150 MG PO TABS: ORAL_TABLET | ORAL | 0 refills | Status: DC

## 2024-03-31 NOTE — Addendum Note (Signed)
 Addended by: LAVELL LYE A on: 03/31/2024 04:40 PM   Modules accepted: Orders

## 2024-04-01 MED ORDER — FLUCONAZOLE 150 MG PO TABS: ORAL_TABLET | ORAL | 0 refills | Status: DC

## 2024-04-01 NOTE — Addendum Note (Signed)
 Addended by: VIVIENNE FORTUNATO HERO on: 04/01/2024 02:23 PM   Modules accepted: Orders

## 2024-04-13 ENCOUNTER — Ambulatory Visit

## 2024-04-14 ENCOUNTER — Telehealth: Admitting: Nurse Practitioner

## 2024-04-14 DIAGNOSIS — B3731 Acute candidiasis of vulva and vagina: Secondary | ICD-10-CM

## 2024-04-14 DIAGNOSIS — B9689 Other specified bacterial agents as the cause of diseases classified elsewhere: Secondary | ICD-10-CM

## 2024-04-14 DIAGNOSIS — N760 Acute vaginitis: Secondary | ICD-10-CM

## 2024-04-14 MED ORDER — FLUCONAZOLE 150 MG PO TABS: 150.0000 mg | ORAL_TABLET | Freq: Once | ORAL | 0 refills | Status: AC

## 2024-04-14 MED ORDER — METRONIDAZOLE 500 MG PO TABS: 500.0000 mg | ORAL_TABLET | Freq: Two times a day (BID) | ORAL | 0 refills | Status: AC

## 2024-04-14 NOTE — Addendum Note (Signed)
 Addended by: MELVENIA IP on: 04/14/2024 10:14 AM   Modules accepted: Orders

## 2024-04-14 NOTE — Progress Notes (Signed)
 We are sorry that you are not feeling well. Here is how we plan to help! Based on what you shared with me it looks like you: May have a vaginosis due to bacteria  Vaginosis is an inflammation of the vagina that can result in discharge, itching and pain. The cause is usually a change in the normal balance of vaginal bacteria or an infection. Vaginosis can also result from reduced estrogen levels after menopause.  The most common causes of vaginosis are:   Bacterial vaginosis which results from an overgrowth of one on several organisms that are normally present in your vagina.   Yeast infections which are caused by a naturally occurring fungus called candida.   Vaginal atrophy (atrophic vaginosis) which results from the thinning of the vagina from reduced estrogen levels after menopause.   Trichomoniasis which is caused by a parasite and is commonly transmitted by sexual intercourse.  Factors that increase your risk of developing vaginosis include: Medications, such as antibiotics and steroids Uncontrolled diabetes Use of hygiene products such as bubble bath, vaginal spray or vaginal deodorant Douching Wearing damp or tight-fitting clothing Using an intrauterine device (IUD) for birth control Hormonal changes, such as those associated with pregnancy, birth control pills or menopause Sexual activity Having a sexually transmitted infection  Your treatment plan is Metronidazole  or Flagyl  500mg  twice a day for 7 days.  I have electronically sent this prescription into the pharmacy that you have chosen.  Be sure to take all of the medication as directed. Stop taking any medication if you develop a rash, tongue swelling or shortness of breath. Mothers who are breast feeding should consider pumping and discarding their breast milk while on these antibiotics. However, there is no consensus that infant exposure at these doses would be harmful.  Remember that medication creams can weaken latex  condoms.   HOME CARE:  Good hygiene may prevent some types of vaginosis from recurring and may relieve some symptoms:  Avoid baths, hot tubs and whirlpool spas. Rinse soap from your outer genital area after a shower, and dry the area well to prevent irritation. Don't use scented or harsh soaps, such as those with deodorant or antibacterial action. Avoid irritants. These include scented tampons and pads. Wipe from front to back after using the toilet. Doing so avoids spreading fecal bacteria to your vagina.  Other things that may help prevent vaginosis include:  Don't douche. Your vagina doesn't require cleansing other than normal bathing. Repetitive douching disrupts the normal organisms that reside in the vagina and can actually increase your risk of vaginal infection. Douching won't clear up a vaginal infection. Use a latex condom. Both female and female latex condoms may help you avoid infections spread by sexual contact. Wear cotton underwear. Also wear pantyhose with a cotton crotch. If you feel comfortable without it, skip wearing underwear to bed. Yeast thrives in [facility] Your symptoms should improve in the next day or two.  GET HELP RIGHT AWAY IF:  You have pain in your lower abdomen ( pelvic area or over your ovaries) You develop nausea or vomiting You develop a fever Your discharge changes or worsens You have persistent pain with intercourse You develop shortness of breath, a rapid pulse, or you faint.  These symptoms could be signs of problems or infections that need to be evaluated by a medical provider now.  MAKE SURE YOU   Understand these instructions. Will watch your condition. Will get help right away if you are not doing  well or get worse.  Your e-visit answers were reviewed by a board certified advanced clinical practitioner to complete your personal care plan. Depending upon the condition, your plan could have included both over the counter or  prescription medications. Please review your pharmacy choice to make sure that you have choses a pharmacy that is open for you to pick up any needed prescription, Your safety is important to us . If you have drug allergies check your prescription carefully.   You can use MyChart to ask questions about todays visit, request a non-urgent call back, or ask for a work or school excuse for 24 hours related to this e-Visit. If it has been greater than 24 hours you will need to follow up with your provider, or enter a new e-Visit to address those concerns. You will get a MyChart message within the next two days asking about your experience. I hope that your e-visit has been valuable and will speed your recovery.  I have spent 5 minutes in review of e-visit questionnaire, review and updating patient chart, medical decision making and response to patient.   Hadassah Fireman, NP

## 2024-04-14 NOTE — Progress Notes (Signed)

## 2024-04-18 ENCOUNTER — Telehealth: Admitting: Physician Assistant

## 2024-04-18 DIAGNOSIS — R6884 Jaw pain: Secondary | ICD-10-CM

## 2024-04-18 NOTE — Progress Notes (Signed)
" °  Because of the nature of your symptoms, I feel your condition warrants further evaluation and I recommend that you be seen in a face-to-face visit. You should be evaluated for possible tooth infection vs TMJ dysfunction. Unable to determine this virtually and they are treated completely different, which is why you should be seen to determine the cause.   NOTE: There will be NO CHARGE for this E-Visit   If you are having a true medical emergency, please call 911.     For an urgent face to face visit, [facility] has multiple urgent care centers for your convenience.  Click the link below for the full list of locations and hours, walk-in wait times, appointment scheduling options and driving directions:  Urgent Care - [place], [place], [place], [employer], [place], [place]  [place]     Your MyChart E-visit questionnaire answers were reviewed by a board certified advanced clinical practitioner to complete your personal care plan based on your specific symptoms.    Thank you for using e-Visits.    "
